# Patient Record
Sex: Female | Born: 1975 | Race: White | Hispanic: No | Marital: Married | Smoking: Former smoker
Health system: Southern US, Community
[De-identification: ages and names within clinical notes are randomized; demographics above are authoritative.]

## PROBLEM LIST (undated history)

## (undated) DIAGNOSIS — I2699 Other pulmonary embolism without acute cor pulmonale: Secondary | ICD-10-CM

## (undated) DIAGNOSIS — E041 Nontoxic single thyroid nodule: Secondary | ICD-10-CM

## (undated) DIAGNOSIS — K219 Gastro-esophageal reflux disease without esophagitis: Secondary | ICD-10-CM

## (undated) DIAGNOSIS — C50919 Malignant neoplasm of unspecified site of unspecified female breast: Secondary | ICD-10-CM

## (undated) DIAGNOSIS — R519 Headache, unspecified: Secondary | ICD-10-CM

## (undated) DIAGNOSIS — Z8619 Personal history of other infectious and parasitic diseases: Secondary | ICD-10-CM

## (undated) DIAGNOSIS — F419 Anxiety disorder, unspecified: Secondary | ICD-10-CM

## (undated) DIAGNOSIS — F329 Major depressive disorder, single episode, unspecified: Secondary | ICD-10-CM

## (undated) DIAGNOSIS — D0510 Intraductal carcinoma in situ of unspecified breast: Secondary | ICD-10-CM

## (undated) DIAGNOSIS — T7840XA Allergy, unspecified, initial encounter: Secondary | ICD-10-CM

## (undated) DIAGNOSIS — F32A Depression, unspecified: Secondary | ICD-10-CM

## (undated) HISTORY — DX: Nontoxic single thyroid nodule: E04.1

## (undated) HISTORY — PX: WRIST ARTHROSCOPY: SHX838

## (undated) HISTORY — DX: Gastro-esophageal reflux disease without esophagitis: K21.9

## (undated) HISTORY — DX: Personal history of other infectious and parasitic diseases: Z86.19

## (undated) HISTORY — PX: BREAST SURGERY: SHX581

## (undated) HISTORY — PX: TONSILLECTOMY: SHX5217

## (undated) HISTORY — DX: Depression, unspecified: F32.A

## (undated) HISTORY — DX: Allergy, unspecified, initial encounter: T78.40XA

## (undated) HISTORY — DX: Major depressive disorder, single episode, unspecified: F32.9

## (undated) HISTORY — PX: ELBOW SURGERY: SHX618

## (undated) HISTORY — PX: TONSILLECTOMY: SUR1361

## (undated) HISTORY — PX: AUGMENTATION MAMMAPLASTY: SUR837

## (undated) HISTORY — DX: Malignant neoplasm of unspecified site of unspecified female breast: C50.919

## (undated) HISTORY — DX: Headache, unspecified: R51.9

---

## 1997-10-17 ENCOUNTER — Ambulatory Visit (HOSPITAL_BASED_OUTPATIENT_CLINIC_OR_DEPARTMENT_OTHER): Admission: RE | Admit: 1997-10-17 | Discharge: 1997-10-17 | Payer: Self-pay | Admitting: *Deleted

## 1999-03-18 ENCOUNTER — Ambulatory Visit (HOSPITAL_BASED_OUTPATIENT_CLINIC_OR_DEPARTMENT_OTHER): Admission: RE | Admit: 1999-03-18 | Discharge: 1999-03-18 | Payer: Self-pay | Admitting: Orthopedic Surgery

## 2000-02-06 ENCOUNTER — Other Ambulatory Visit: Admission: RE | Admit: 2000-02-06 | Discharge: 2000-02-06 | Payer: Self-pay | Admitting: Obstetrics and Gynecology

## 2000-04-14 ENCOUNTER — Other Ambulatory Visit: Admission: RE | Admit: 2000-04-14 | Discharge: 2000-04-14 | Payer: Self-pay | Admitting: Obstetrics and Gynecology

## 2000-12-16 ENCOUNTER — Other Ambulatory Visit: Admission: RE | Admit: 2000-12-16 | Discharge: 2000-12-16 | Payer: Self-pay | Admitting: Obstetrics and Gynecology

## 2001-07-01 ENCOUNTER — Inpatient Hospital Stay (HOSPITAL_COMMUNITY): Admission: AD | Admit: 2001-07-01 | Discharge: 2001-07-03 | Payer: Self-pay | Admitting: Obstetrics and Gynecology

## 2001-08-03 ENCOUNTER — Other Ambulatory Visit: Admission: RE | Admit: 2001-08-03 | Discharge: 2001-08-03 | Payer: Self-pay | Admitting: Obstetrics and Gynecology

## 2002-08-04 ENCOUNTER — Other Ambulatory Visit: Admission: RE | Admit: 2002-08-04 | Discharge: 2002-08-04 | Payer: Self-pay | Admitting: Obstetrics and Gynecology

## 2003-01-23 ENCOUNTER — Ambulatory Visit (HOSPITAL_COMMUNITY): Admission: RE | Admit: 2003-01-23 | Discharge: 2003-01-23 | Payer: Self-pay | Admitting: Obstetrics and Gynecology

## 2003-05-11 ENCOUNTER — Encounter: Admission: RE | Admit: 2003-05-11 | Discharge: 2003-05-11 | Payer: Self-pay | Admitting: Obstetrics and Gynecology

## 2003-08-29 ENCOUNTER — Inpatient Hospital Stay (HOSPITAL_COMMUNITY): Admission: AD | Admit: 2003-08-29 | Discharge: 2003-08-31 | Payer: Self-pay | Admitting: Obstetrics and Gynecology

## 2003-09-24 ENCOUNTER — Other Ambulatory Visit: Admission: RE | Admit: 2003-09-24 | Discharge: 2003-09-24 | Payer: Self-pay | Admitting: Obstetrics and Gynecology

## 2004-10-10 ENCOUNTER — Other Ambulatory Visit: Admission: RE | Admit: 2004-10-10 | Discharge: 2004-10-10 | Payer: Self-pay | Admitting: Obstetrics and Gynecology

## 2009-03-07 ENCOUNTER — Ambulatory Visit (HOSPITAL_COMMUNITY): Admission: RE | Admit: 2009-03-07 | Discharge: 2009-03-07 | Payer: Self-pay | Admitting: Obstetrics and Gynecology

## 2009-03-30 DEATH — deceased

## 2010-04-03 ENCOUNTER — Ambulatory Visit
Admission: RE | Admit: 2010-04-03 | Discharge: 2010-04-03 | Payer: Self-pay | Source: Home / Self Care | Attending: Sports Medicine | Admitting: Sports Medicine

## 2010-04-03 DIAGNOSIS — M629 Disorder of muscle, unspecified: Secondary | ICD-10-CM | POA: Insufficient documentation

## 2010-04-21 ENCOUNTER — Telehealth (INDEPENDENT_AMBULATORY_CARE_PROVIDER_SITE_OTHER): Payer: Self-pay | Admitting: *Deleted

## 2010-05-01 NOTE — Progress Notes (Signed)
  Phone Note Call from Patient   Caller: Patient Summary of Call: Spoke with pt- she states she has taken 2 weeks off of running and worked on hip strengthening exercises.  Went to gym yesterday, tried treadmill- walked first then ran 1/2 and rt lateral knee pain started, was wearing body helix.  Dr. Darrick Penna talked with pt- advised her to try walking 1 min then running 1 min- doing this for intervals.  When no longer painful can increase to 2 min intervals.  Cont hip strengthening exercises return for f/u in 3-4 weeks as it could take a total of 6 weeks for this problem to improve.  Initial call taken by: Rochele Pages RN,  April 21, 2010 12:11 PM

## 2010-05-01 NOTE — Assessment & Plan Note (Signed)
Summary: NP KNEE/ITB BAND - RACE IN MARCH/POST STEROID SHOT   Vital Signs:  Patient profile:   35 year old female Height:      64.5 inches Weight:      122 pounds BMI:     20.69 Pulse rate:   66 / minute BP sitting:   108 / 73  (left arm)  Vitals Entered By: Rochele Pages RN (April 03, 2010 11:02 AM) CC: Rt lateral knee pain intermittent x1 yr   CC:  Rt lateral knee pain intermittent x1 yr.  History of Present Illness: 35yo female to office with c/o R lateral knee pain x 1-yr. Pain has been intermittant, minimal pain from June-Nov, but increasing pain over past several weeks. 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 Lajoyce Corners who thought she is getting ITBS on Rt due to hip weakness.  Steroid injection 12/7 by Dr. Lajoyce Corners which helped for about 2-weeks, but pain back. Previously running 20-miles/wk, now down to 2-miles/wk b/c of pain. Pain usually occurs about 3/4 thru her runs, on long runs usually occurs about mile 7. In addition to CSI, has tried ART, accupuncture, deep tissue massage without improvement.   Icing occasionally without relief. Using Ibuprofen which is helpful.  Tried Pennsaid without help.  X-rays done by Dr. Lajoyce Corners 03/05/10 - 2-views of R knee show no acute abnormalities, no DJD. She also has hx of sciatica on the right, but pain with that stops at her buttock.  Denies any lower ext. numbness/tingling.  No change in bowel or bladder.    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  Physical Exam  General:  Well-developed,well-nourished,in no acute distress; alert,appropriate and cooperative throughout examination Head:  [place]/AT Eyes:  PERRLA, EOMI Mouth:  MM moist/pink Lungs:  normal respiratory effort.   Msk:  HIPS: FROM b/l  without pain.  Neg FABER & FADIR.  Tight ITB on Rt with Ober test, neg Ober on left.  Hip strength - normal flexion & extension b/l, weak abductors & adductors b/l.  KNEES: FROM b/l without pain.  No TTP over joint lines, PT or quad tendon.  Mild TTP along insertion of ITB at [facility] tubercles & mild TTP along distal aspect of ITB.  No ligamentous laxity & Neg McMurray b/l.  ANKLES: FROM without pain, swelling, tenderness, weakness, or laxity.  FEET: b/l Morton's foot, small morton's callous b/l.  Moderate long arch, breakdown of transverse arch with splaying of 1st & 2nd toes b/l.  No tenderness over forefoot, midfoot, or hindfoot.  GAIT: no leg length difference.  Good running form with neutral strike pattern, has significant knee wobble b/l.  No excessive pronation or supination.  BACK: FROM without pain.  No midline or paraspinal tenderness.  Good mobility of SIJ b/l.  Neg SLR.  Able to toe walk & heel walk.   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 - Educated on HEP to focus on hip strength - Educated on ITB stretches & lower extremity stretches to improve flexibility -  Fitted with sports insoles which were comfortable in office today - Fitted with Body Helix knee sleeve to wear with activity - Recommended cross-training with bike & elliptical - f/u 51-month, call with any questions/concerns  Orders: Garment,belt,sleeve or other covering ,elastic or similar stretch (Z6109) Sports Insoles (U0454)   Orders Added: 1)  Garment,belt,sleeve or other covering ,elastic or similar stretch [A4466] 2)  New Patient Level II [99202] 3)  Sports Insoles [L3510]

## 2010-05-05 ENCOUNTER — Ambulatory Visit: Payer: Self-pay | Admitting: Sports Medicine

## 2010-05-19 ENCOUNTER — Ambulatory Visit (INDEPENDENT_AMBULATORY_CARE_PROVIDER_SITE_OTHER): Payer: 59 | Admitting: Sports Medicine

## 2010-05-19 ENCOUNTER — Encounter: Payer: Self-pay | Admitting: Sports Medicine

## 2010-05-19 DIAGNOSIS — M629 Disorder of muscle, unspecified: Secondary | ICD-10-CM

## 2010-05-19 DIAGNOSIS — S335XXA Sprain of ligaments of lumbar spine, initial encounter: Secondary | ICD-10-CM

## 2010-05-19 DIAGNOSIS — M66259 Spontaneous rupture of extensor tendons, unspecified thigh: Secondary | ICD-10-CM

## 2010-05-27 NOTE — Assessment & Plan Note (Signed)
Summary: FU ITB/MC/MJD   Vital Signs:  Patient profile:   35 year old female BP sitting:   109 / 76  Vitals Entered By: Rochele Pages RN (May 19, 2010 11:55 AM)  History of Present Illness: right leg pain: dx with ITB pain and seen for this problem at last visit- here for f/up  doing home exercises- now doing exercises with ankle weights. 1 x week walk/run. or crosstraining on eliptical and bike-- no pain with these.   frustrated that even now after resting for 6 weeks still having symptoms with easy walk/run.   Pain starts in right lateral knee area- this area tender to touch after walk/runs.  pain worse with running or walking hills.    Low back pain:  x 3 days. Woke up saturday am with low back pain, now tightness/pain continues in low back over weekend.    Worse with bending forward as well as leaning back.   Present throughout lower back and radiates to sides of hips.  The night prior to waking up in pain- had picked up 51 year old child. And felt a "twinge" in her back at that time.      Physical Exam  General:  Well-developed,well-nourished,in no acute distress; alert,appropriate and cooperative throughout examination Additional Exam:  MSK [place] RT knee is visualized to have a partial tear of vastus lateralis tendon increased fluid small loose body noted Lt by comparison has normal level of fluid med and lat meniscus OK ITB looks fine with no fluid   Knee Exam  General:    Well-developed, well-nourished, normal body habitus; no deformities, normal grooming.  Gait:    Normal heel-toe gait pattern bilaterally.    Skin:    Intact, no scars,  rashes,or bruising.    Inspection:     No deformity, ecchymosis or swelling.   Palpation:    + mild tenderness over lateral upper knee area on right.  Vascular:    There was no swelling or varicose veins. The pulses and temperature are normal. There was no edema or tenderness.  Sensory:    Gross coordination  and sensation were normal.    Knee Exam:    Right:    Inspection:  Normal    Palpation:  Normal    Stability:  stable    Tenderness:  no    Swelling:  no    Erythema:  no    Left:    Inspection:  Normal    Palpation:  Normal    Stability:  stable    Tenderness:  no    Swelling:  no    Erythema:  no    full range of motion on knee exam bilatera.   Detailed Back/Spine Exam  Lumbosacral Exam:  Inspection-deformity:    Normal Palpation-spinal tenderness:  Normal Squatting:  normal Lying Straight Leg Raise:    Right:  negative    Left:  negative Sitting Straight Leg Raise:    Right:  negative    Left:  negative Fabere Test:    Right:  negative    Left:  negative     Normal range of motion- some tenderness in lower back area with leaning forward and extension of torso.    Impression & Recommendations:  Problem # 1:  RUPTURE, QUADRICEPS TENDON (ICD-727.65)  partial tear of right vastus lateralus identified on U/S.  Pt to place nitroglycerin as directed on affected area.  pt is to continue bike and eliptical as long as not causing any  pain.  Pt given quad exercises (see pt instructions).  She is to return in 4 weeks for follow/up.   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  Pt to apply heat.  Gave stretching /ROM exercises to relieve pain.   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 Apply 1/4 patch to affected area  Patient Instructions: 1)  apply nitroglycerin patch (1/4 patch) to quad tendon area as we discussed today.- 1 patch daily 2)  continue with bike. 3)  quad exercises: 4)  warm up quads with contraction exercises 5)  straight leg raise 3 sets of 15 6)  seated quad extention with light ankle weight  3 sets of 15 7)  squats to 45 degrees with heels on book, toes on floor 8)  Follow exercises as outlined on handout. 9)  continue to use body helix. 10)  return in 4 weeks for recheck Prescriptions: NITROGLYCERIN 0.2 MG/HR PT24 (NITROGLYCERIN) apply 1/4 patch to affected area  #30 x 1   Entered by:   Ellin Mayhew MD   Authorized by:   Enid Baas MD   Signed by:   Ellin Mayhew MD on 05/19/2010   Method used:   Electronically to        [facility] [address] [phone]* (retail)       [address]       [place], [place]  [address]       Ph: [phone] or [phone]       Fax: [phone]   RxID:   [id number]    Orders Added: 1)  Est. Patient Level IV [34742] 2)  [place] LIMITED [[id number]]

## 2010-06-12 ENCOUNTER — Encounter: Payer: Self-pay | Admitting: *Deleted

## 2010-06-16 ENCOUNTER — Ambulatory Visit (INDEPENDENT_AMBULATORY_CARE_PROVIDER_SITE_OTHER): Payer: 59 | Admitting: Sports Medicine

## 2010-06-16 ENCOUNTER — Encounter: Payer: Self-pay | Admitting: Sports Medicine

## 2010-06-16 DIAGNOSIS — M66259 Spontaneous rupture of extensor tendons, unspecified thigh: Secondary | ICD-10-CM

## 2010-06-26 NOTE — Assessment & Plan Note (Signed)
Summary: FOLLOWUP   Vital Signs:  Patient profile:   35 year old female BP sitting:   108 / 80  (right arm)  Vitals Entered By: Rochele Pages RN (June 16, 2010 11:59 AM) CC: f/u rt quad pain   CC:  f/u rt quad pain.  History of Present Illness: Pt here for follow up of rt quad pain.  States she is using elliptical and swimming with no pain.  Has done no running since last appt.  HEP 4-5 times per week.  NTG daily- but has only been wearing for 10 hours, was not aware that she should wear for 24 hours.  NTG causes mild headaches in the morning. Denies swelling in knee, or knee pain, feels her IT band is tight most of the time, but this has unchanged  Preventive Screening-Counseling & Management  Alcohol-Tobacco     Smoking Status: never  Current Medications (verified): 1)  Nitroglycerin 0.2 Mg/hr Pt24 (Nitroglycerin) .... Apply 1/4 Patch To Affected Area  Allergies (verified): No Known Drug Allergies  Physical Exam  General:  Vital signs noted NAD, well developed, well noursished, atheletic appearing Msk:  Right LE-  Inspection- no brusing or swelling noted over knee or lateral aspect of thigh, good definition of quads Palpation- lateral aspect of quads non tender IT non tender Strength- Abductors 5/5, hamstrings 5/5 Knee-normal ROM  Gait- good posture, no pronation noted  Additional Exam:  MSK [place] RT knee is visualized to have a partial tear of vastus lateralis tendon-healing minimal effusion noted small loose body noted IT band normal neo vessels throughout tendon   Impression & Recommendations:  Problem # 1:  RUPTURE, QUADRICEPS TENDON (ICD-727.65) Assessment Improved  Ms. Beans has had a great amount of improvemnent visuazlized by ultrasound. Will use NTG as tolerated secondary to headaches, recheck U/S in 6 weeks Start Walk run program See instructions   Milinda Antis MD, PGY-3  Orders: [place] LIMITED [phone])  Complete Medication List: 1)   Nitroglycerin 0.2 Mg/hr Pt24 (Nitroglycerin) .... Apply 1/4 patch to affected area  Patient Instructions: 1)  Start the walk/run for 20-30 minutes and then build up to short runs ( 3 miles), then add a mile 2)  Continue the nitroglycerin as tolerated at bedtime 3)  Recheck in 6 weeks   Orders Added: 1)  Est. Patient Level III [60454] 2)  [place] LIMITED [[id number]]

## 2010-07-01 LAB — CBC
HCT: 39 % (ref 36.0–46.0)
Hemoglobin: 13.1 g/dL (ref 12.0–15.0)
MCHC: 33.6 g/dL (ref 30.0–36.0)
MCV: 92.6 fL (ref 78.0–100.0)
Platelets: 146 10*3/uL — ABNORMAL LOW (ref 150–400)
RBC: 4.22 MIL/uL (ref 3.87–5.11)
RDW: 13.2 % (ref 11.5–15.5)
WBC: 10 10*3/uL (ref 4.0–10.5)

## 2010-07-01 LAB — ABO/RH: ABO/RH(D): O POS

## 2010-07-30 ENCOUNTER — Other Ambulatory Visit: Payer: Self-pay | Admitting: Obstetrics and Gynecology

## 2010-08-14 ENCOUNTER — Ambulatory Visit (INDEPENDENT_AMBULATORY_CARE_PROVIDER_SITE_OTHER): Payer: 59 | Admitting: Family Medicine

## 2010-08-14 ENCOUNTER — Encounter: Payer: Self-pay | Admitting: Family Medicine

## 2010-08-14 VITALS — BP 114/75 | HR 50 | Temp 98.2°F | Ht 65.0 in | Wt 127.0 lb

## 2010-08-14 DIAGNOSIS — M629 Disorder of muscle, unspecified: Secondary | ICD-10-CM

## 2010-08-14 DIAGNOSIS — M2420 Disorder of ligament, unspecified site: Secondary | ICD-10-CM

## 2010-08-14 NOTE — Patient Instructions (Signed)
You have IT band syndrome Avoid painful activities as much as possible. Ice over area of pain 3-4 times a day for 15 minutes at a time Hip abduction exercise 3 x 15 once a day - add weights if this becomes too easy. Continue with IT band stretches - when doing these, hold for 20-30 seconds x 3 - do once or twice a day. Tylenol and/or aleve as needed for pain. You were given a repeat cortisone injection today - I would not repeat another one. Avoid hilly terrain as much as possible. Cross train over the next week then ok to get back to walk/jog program. Ok to try Active Release Therapy again. PRP is a consideration but is unproven - you would have to call orthopedic groups in the area to ask who uses this and what the out of pocket cost is. Surgery is an option as well but is typically the last line of treatment as a vast majority get better without this.

## 2010-08-18 ENCOUNTER — Encounter: Payer: Self-pay | Admitting: Family Medicine

## 2010-08-18 NOTE — Progress Notes (Signed)
Subjective:    Patient ID: Kristina King, female    DOB: 12-Feb-1976, 35 y.o.   MRN: [id number]  HPI  35 yo F here for right IT band issues.  Patient's last OV was about 6 weeks ago for the same. Was found by ultrasound to have a vastus lateralis partial tear - used nitroglycerin, did exercises and this improved both clinically and by ultrasound.  States she used the nitro for 6-8 weeks though did cause mild headache. She continues to have right lateral knee pain and pain along IT band. Pain started more than a year ago. Has tried cortisone injection (by Dr. Lajoyce Corners - was about 5 inches above knee joint) but no help with this. Done ART which helped some. Deep tissue massage also tried. Still doing standing hip rotations and abduction exercise - feels stronger but pain persists. No pain currently while sitting or walking. Tried to do walk/jog program but Monday when not even up to 2 miles had severe pain lateral knee and had to stop. Has been able to cross train with elliptical and bike. No swelling or bruising.  Past Medical History  Diagnosis Date  . Depression     Current Outpatient Prescriptions on File Prior to Visit  Medication Sig Dispense Refill  . DISCONTD: nitroGLYCERIN (NITRODUR) 0.2 mg/hr apply 1/4 patch to affected area         No past surgical history on file.  No Known Allergies  History   Social History  . Marital Status: Married    Spouse Name: N/A    Number of Children: N/A  . Years of Education: N/A   Occupational History  . Not on file.   Social History Main Topics  . Smoking status: Never Smoker   . Smokeless tobacco: Not on file  . Alcohol Use: Not on file  . Drug Use: Not on file  . Sexually Active: Not on file   Other Topics Concern  . Not on file   Social History Narrative  . No narrative on file    Family History  Problem Relation Age of Onset  . Hypertension Mother   . Hypertension Father   . Heart attack Neg Hx   . Diabetes  Neg Hx     BP 114/75  Pulse 50  Temp(Src) 98.2 F (36.8 C) (Oral)  Ht 5\' 5"  (1.651 m)  Wt 127 lb (57.607 kg)  BMI 21.13 kg/m2  Review of Systems See HPI above.    Objective:   Physical Exam Gen: NAD R knee: No gross deformity, swelling, bruising. TTP throughout IT band but greatest just above knee joint about level of lateral femoral condyle.  No medial or lateral joint line TTP.  No TTP of quad with good definition. FROM. Strength 5/5 with hip abduction. Ligaments intact Negative Mcmurrays. No leg length inequality. Able to jog in hallway - inturns both patellas mildly but otherwise form normal.    MSK u/s: IT band appears intact without increased neovascularity, scar tissue, calcifications.  Vastus lateralis appears healed without neovascularity.  Calcification visualized.  No pain on ultrasound of this area or palpation of this area.  Small amount of edema but compared to left knee is equal.  Assessment & Plan:  1. R ITBS - History and exam consistent with refractory ITBS.  Glut medius strength now 5/5 and compliant with exercises and stretches.  Using comforthotics.  No leg length inequality.  Unsuccessful with walk/jog program.  Finished a course of nitro patches x ~6-8  weeks - caused mild headache but was able to continue full course.  Do not see area that would be responsive to further course.  Discussed options: additional PT with ionto, cortisone injection, consideration of PRP, surgical referral.  We discussed natural history and that surgery, PRP unproven for this condition and felt to be last resort of treatment.  Has done exercises and stretches regularly and this shows in her exam today (5/5 strength).  Will attempt cortisone shot again (patient noted today's maximum area of tenderness at lateral femoral condyle where shot was given is more distal than prior shot that did not help).  Continue cross training, can work back into walk/jog.  Vastus lateralis tear appears  healed and not showing any signs of this being an issue any longer.  See instructions for further.  After informed written consent, patient was lying on exam table on left side.  Area overlying distal IT band was prepped with alcohol swab.  Distal IT band then injected with 2:1 marcaine: depomedrol with peppering of needle into area of maximum tenderness here.  Patient tolerated procedure well without any immediate complications.

## 2010-08-18 NOTE — Assessment & Plan Note (Signed)
History and exam consistent with refractory ITBS.  Glut medius strength now 5/5 and compliant with exercises and stretches.  Using comforthotics.  No leg length inequality.  Unsuccessful with walk/jog program.  Finished a course of nitro patches x ~6-8 weeks - caused mild headache but was able to continue full course.  Do not see area that would be responsive to further course.  Discussed options: additional PT with ionto, cortisone injection, consideration of PRP, surgical referral.  We discussed natural history and that surgery, PRP unproven for this condition and felt to be last resort of treatment.  Has done exercises and stretches regularly and this shows in her exam today (5/5 strength).  Will attempt cortisone shot again (patient noted today's maximum area of tenderness at lateral femoral condyle where shot was given is more distal than prior shot that did not help).  Continue cross training, can work back into walk/jog.  Vastus lateralis tear appears healed and not showing any signs of this being an issue any longer.  See instructions for further.  After informed written consent, patient was lying on exam table on left side.  Area overlying distal IT band was prepped with alcohol swab.  Distal IT band then injected with 2:1 marcaine: depomedrol with peppering of needle into area of maximum tenderness here.  Patient tolerated procedure well without any immediate complications.

## 2011-12-29 ENCOUNTER — Telehealth: Payer: Self-pay | Admitting: *Deleted

## 2011-12-29 NOTE — Telephone Encounter (Signed)
Pt states she injured her knee working out Friday.  Knee is popping, catching, and feels like it could give out.  Scheduled her for an appt tomorrow with Dr. Margaretha Sheffield.

## 2011-12-29 NOTE — Telephone Encounter (Signed)
Message copied by Mora Bellman on Tue Dec 29, 2011  5:17 PM ------      Message from: Lizbeth Bark      Created: Tue Dec 29, 2011  3:50 PM      Regarding: phone message      Contact: [phone]       Pt wanted to talk to Dr. Darrick Penna or a nurse regarding her it band pain. Didn't want to schedule appt unless it was needed.

## 2011-12-29 NOTE — Telephone Encounter (Signed)
Left pt a VM to return my call  

## 2011-12-30 ENCOUNTER — Ambulatory Visit (INDEPENDENT_AMBULATORY_CARE_PROVIDER_SITE_OTHER): Payer: 59 | Admitting: Sports Medicine

## 2011-12-30 ENCOUNTER — Encounter: Payer: Self-pay | Admitting: Sports Medicine

## 2011-12-30 VITALS — BP 124/83 | HR 61 | Ht 65.0 in | Wt 127.0 lb

## 2011-12-30 DIAGNOSIS — M25569 Pain in unspecified knee: Secondary | ICD-10-CM

## 2011-12-30 DIAGNOSIS — M25561 Pain in right knee: Secondary | ICD-10-CM

## 2011-12-30 NOTE — Patient Instructions (Addendum)
You have been scheduled for an appointment for your MRI on Friday - 01/01/12 arrive at 3pm for a 3:15pm appointment.

## 2011-12-30 NOTE — Progress Notes (Signed)
  Subjective:    Patient ID: Kristina King, female    DOB: 08/08/1975, 36 y.o.   MRN: [id number]  HPI chief complaint: Right knee pain  Patient is a very pleasant, very active 36 year old female that comes in today complaining of sudden onset right knee pain and swelling. Symptoms began 4 days ago after a workout. No injury at the time of the workout, but symptoms started immediately thereafter and have gotten worse over the past 4 days. She has a history of IT band syndrome and a quad tendon strain in this knee but her current pain is different in nature than which is expressed previously. She's had 2 cortisone injections into the IT band in the past. She's also been working on hip and knee strengthening. She's used over-the-counter anti-inflammatories, but despite this her symptoms persist. She gets painful catching and popping with the knee with standing and twisting. Knee feels like it wants to give way. No prior knee surgeries.   Review of Systems as above     Objective:   Physical Exam Well-developed, well-nourished. No acute distress. Awake alert and oriented x3  Right knee: Range of motion 0-100. 1+ patellofemoral crepitus with tethering of the patella laterally. 1+ joint effusion. She is tender to palpation along the medial joint line with a positive McMurrays. Positive Thessalys. Knee is stable to valgus and varus stressing. Negative anterior drawer, negative posterior drawer. No tenderness over the presence anserine bursa. Neurovascular intact distally. Walking with an obviously limp.  MSK ultrasound right knee: Patient has a joint effusion. Quad tendon is intact and within normal limits. Visualized portion of the medial meniscus appears to be unremarkable. Lateral meniscus was difficult to visualize. No edema in the pes anserine bursa        Assessment & Plan:  1. Right knee pain secondary to medial meniscal tear versus chondromalacia patella  Although the patient's current  knee pain and swelling are different in nature than what she has experienced previously, I question whether or not her chronic IT band syndrome is not something else. She's had 2 cortisone injections into her IT band and has had aggressive therapy in the form of a home exercise program. Oral anti-inflammatories are ineffective in her current exam has me concerned for possible meniscal tear. We will get an MRI scan of this right knee specifically to rule out a medial meniscal tear as well as to evaluate degree of chondromalacia which may be present. I've given her a body helix compression sleeve to wear and I've asked her to avoid impact exercise until after I reviewed the MRI. I will call her at [phone] with those findings once available at which point we will delineate further treatment.

## 2012-01-05 ENCOUNTER — Ambulatory Visit (INDEPENDENT_AMBULATORY_CARE_PROVIDER_SITE_OTHER): Payer: 59 | Admitting: Sports Medicine

## 2012-01-05 VITALS — BP 120/70 | Ht 65.0 in | Wt 127.0 lb

## 2012-01-05 DIAGNOSIS — M25569 Pain in unspecified knee: Secondary | ICD-10-CM

## 2012-01-05 DIAGNOSIS — M25469 Effusion, unspecified knee: Secondary | ICD-10-CM

## 2012-01-05 NOTE — Progress Notes (Signed)
  Subjective:    Patient ID: Kristina King, female    DOB: Nov 17, 1975, 36 y.o.   MRN: [id number]  HPI Patient comes in today to go over MRI findings of her right knee. Only abnormality was a small 4 mm x 6 mm area of partial thickness cartilage loss at the anterior weightbearing femoral condyle. No meniscal tear. She also has a thickened suprapatellar plica with a moderate joint effusion. Her pain continues to be diffuse around the knee. Swelling has improved with the body heel is compression sleeve. She's getting some catching and popping as well as some feelings of instability.    Review of Systems     Objective:   Physical Exam Well-developed, well-nourished. No acute distress  Right knee: Range of motion 0-130. Trace effusion. She does have a palpable suprapatellar plica but it is nonpainful. She still has slight tenderness along the medial joint line and over the medial femoral condyle. Good ligamentous stability. Neurovascularly intact distally. Walks with a slight limp.       Assessment & Plan:  1. Right knee pain and swelling with MRI evidence of a small focal area of partial cartilage loss in suprapatellar plica  I recommended a cortisone injection for the right knee. Right knee was injected using an anterior medial approach. I've instructed the patient on isometric quad strength she can begin to increase her activity as tolerated. I do want her to avoid squats and lunges and to use her compression sleeve with activity as well as one-hour post exercise. If symptoms persist, I would consider referral to orthopedics for diagnostic arthroscopy and possible plica excision. Followup when necessary.

## 2012-01-07 DIAGNOSIS — Z23 Encounter for immunization: Secondary | ICD-10-CM

## 2013-01-06 ENCOUNTER — Ambulatory Visit (INDEPENDENT_AMBULATORY_CARE_PROVIDER_SITE_OTHER): Payer: 59

## 2013-01-06 DIAGNOSIS — Z23 Encounter for immunization: Secondary | ICD-10-CM

## 2013-03-31 ENCOUNTER — Other Ambulatory Visit (HOSPITAL_COMMUNITY): Payer: Self-pay | Admitting: Obstetrics and Gynecology

## 2013-03-31 ENCOUNTER — Ambulatory Visit (HOSPITAL_COMMUNITY)
Admission: RE | Admit: 2013-03-31 | Discharge: 2013-03-31 | Disposition: A | Discharge status: Home / Self Care | Payer: 59 | Source: Ambulatory Visit | Attending: Obstetrics and Gynecology | Admitting: Obstetrics and Gynecology

## 2013-03-31 DIAGNOSIS — E041 Nontoxic single thyroid nodule: Secondary | ICD-10-CM

## 2013-03-31 DIAGNOSIS — E042 Nontoxic multinodular goiter: Secondary | ICD-10-CM | POA: Insufficient documentation

## 2013-04-03 ENCOUNTER — Ambulatory Visit (HOSPITAL_COMMUNITY): Payer: Self-pay

## 2013-04-04 ENCOUNTER — Other Ambulatory Visit: Payer: Self-pay | Admitting: Obstetrics and Gynecology

## 2013-04-04 DIAGNOSIS — E041 Nontoxic single thyroid nodule: Secondary | ICD-10-CM

## 2013-04-06 ENCOUNTER — Ambulatory Visit
Admission: RE | Admit: 2013-04-06 | Discharge: 2013-04-06 | Disposition: A | Discharge status: Home / Self Care | Payer: BC Managed Care – PPO | Source: Ambulatory Visit | Attending: Obstetrics and Gynecology | Admitting: Obstetrics and Gynecology

## 2013-04-06 ENCOUNTER — Other Ambulatory Visit (HOSPITAL_COMMUNITY)
Admission: RE | Admit: 2013-04-06 | Discharge: 2013-04-06 | Disposition: A | Discharge status: Home / Self Care | Payer: BC Managed Care – PPO | Source: Ambulatory Visit | Attending: Interventional Radiology | Admitting: Interventional Radiology

## 2013-04-06 DIAGNOSIS — E041 Nontoxic single thyroid nodule: Secondary | ICD-10-CM | POA: Insufficient documentation

## 2013-04-06 MED ORDER — DEXAMETHASONE 6 MG PO TABS: 6.0000 mg | ORAL_TABLET | Freq: Once | ORAL | Status: DC

## 2013-04-06 NOTE — Progress Notes (Signed)
1500  Decadron 6 mg tab po (per order Dr Miles CostainShick).  Kristina Cutbirth Carmell AustriaGales, RN 04/06/2013 3:18 PM

## 2013-04-06 NOTE — Progress Notes (Signed)
Patient ID: Kristina BuffaloJennifer L King, female   DOB: 26-Jul-1975, 38 y.o.   MRN: [id number]  [facility] thyroid cystic nodule bx at 9:20am  On exam, there is diffuse thyroid neck swelling acutely post bx (approx 4 hrs) Thyroid gland is swollen, tender, and enlarged.  No overlying bruising.  No resp distress or airway compromise Lungs CTA  No wheezing CV RRR Airway clear  bp 125/81 p 64 RR14  T97.8  98% on RA  US shows diffuse heterogenous thyroid swellling without surrounding soft tissue hematoma  Assessment:  Diffuse thyroid hemorrhage vs acute transient swelling of the thyroid gland post bx  Plan:  Decadron 6mg  po Elevation Ice pack to neck Close observation

## 2013-04-11 ENCOUNTER — Other Ambulatory Visit: Payer: 59

## 2013-04-14 ENCOUNTER — Ambulatory Visit (INDEPENDENT_AMBULATORY_CARE_PROVIDER_SITE_OTHER): Payer: 59 | Admitting: Surgery

## 2013-04-14 ENCOUNTER — Encounter (INDEPENDENT_AMBULATORY_CARE_PROVIDER_SITE_OTHER): Payer: Self-pay | Admitting: Surgery

## 2013-04-14 ENCOUNTER — Encounter (INDEPENDENT_AMBULATORY_CARE_PROVIDER_SITE_OTHER): Payer: Self-pay

## 2013-04-14 VITALS — BP 118/78 | HR 72 | Temp 98.2°F | Resp 14 | Ht 64.5 in | Wt 131.0 lb

## 2013-04-14 DIAGNOSIS — E042 Nontoxic multinodular goiter: Secondary | ICD-10-CM | POA: Insufficient documentation

## 2013-04-14 NOTE — Progress Notes (Signed)
General Surgery [facility]  Chief Complaint  Patient presents with  . New Evaluation    multiple thyroid nodules - referral from Dr. Harold HedgeJames Tomblin    HISTORY: Patient is a 38 year old female referred by her gynecologist with newly diagnosed thyroid nodules. Patient had noted a "feeling that something was stuck in my throat" in early December 2014. On self-examination, the patient noted a mass in the left neck. She presented for physical examination and a mass in the lower left neck was appreciated by her gynecologist. Patient subsequently underwent a thyroid ultrasound. This showed a small multinodular goiter with the right lobe measuring 5.3 cm and left lobe measuring 5.8 cm. The largest nodule on the right measured 1.0 cm. The largest nodule on the left measured 2.9 cm and was largely cystic. Subsequently an ultrasound-guided fine-needle aspiration biopsy was performed on the dominant nodule on the left. Cytopathology shows the contents of a cyst consistent with nonneoplastic goiter. Following the procedure, the patient had significant swelling in the neck and discomfort. This is now largely resolved.  Laboratory studies performed December 2014 showed normal thyroid function test with a T4 level of 9.6 and a TSH level of 1.895.  Patient has no prior history of head or neck surgery. She has never been on thyroid medication. There is a family history of thyroid surgery in a paternal grandmother but her diagnosis is uncertain. There is no family history of endocrine neoplasm.  Past Medical History  Diagnosis Date  . Depression     Current Outpatient Prescriptions  Medication Sig Dispense Refill  . diclofenac (VOLTAREN) 50 MG EC tablet        No current facility-administered medications for this visit.    No Known Allergies  Family History  Problem Relation Age of Onset  . Hypertension Mother   . Cancer Mother     breast  . Hypertension Father   . Heart attack  Neg Hx   . Diabetes Neg Hx   . Cancer Maternal Grandmother     breast  . Cancer Paternal Grandmother     lung    History   Social History  . Marital Status: Married    Spouse Name: N/A    Number of Children: N/A  . Years of Education: N/A   Social History Main Topics  . Smoking status: Former Smoker    Quit date: 04/14/1993  . Smokeless tobacco: Never Used  . Alcohol Use: 0.0 oz/week    1-2 drink(s) per week     Comment: weekly  . Drug Use: No  . Sexual Activity: None   Other Topics Concern  . None   Social History Narrative  . None    REVIEW OF SYSTEMS - PERTINENT POSITIVES ONLY: Denies tremor. Denies palpitations. He denies significant compressive symptoms.  EXAM: Filed Vitals:   04/14/13 0954  BP: 118/78  Pulse: 72  Temp: 98.2 F (36.8 C)  Resp: 14    GENERAL: well-developed, well-nourished, no acute distress HEENT: normocephalic; pupils equal and reactive; sclerae clear; dentition good; mucous membranes moist NECK:  Palpable firm smooth discrete nodule left thyroid lobe approximately 3 cm in size, mobile with swallowing, nontender; right thyroid lobe without palpable abnormality; asymmetric on extension; no palpable anterior or posterior cervical lymphadenopathy; no supraclavicular masses; no tenderness CHEST: clear to auscultation bilaterally without rales, rhonchi, or wheezes CARDIAC: regular rate and rhythm without significant murmur; peripheral pulses are full EXT:  non-tender without edema; no deformity NEURO: no gross focal  deficits; no sign of tremor   LABORATORY RESULTS: See [facility] ([facility]) for most recent results  RADIOLOGY RESULTS: See [facility] ([facility]Epic) for most recent results  IMPRESSION: #1 multinodular thyroid goiter, small #2 dominant left thyroid nodule, 2.9 cm, cystic  PLAN: The patient and I had a lengthy discussion regarding her symptoms and the findings on her ultrasound and fine-needle aspiration biopsy. She  has read extensively on the subject and had many questions. We discussed options for further management. At this point I believe there are 3 options. First, we could continue close observation with a followup ultrasound and TSH level in 6 months. Secondly we could start her on low-dose thyroid hormone and suppress her TSH to approximately 1.0. Third, we could proceed with thyroidectomy.  At this point the patient has no absolute indication for thyroidectomy. Likewise, her thyroid function is normal and I do not think she needs thyroid hormone supplementation. I have recommended close followup with a repeat ultrasound and TSH level in 6 months. I will see her back in the office at that time for physical examination and to discuss her results.  Patient is comfortable with this approach. We will see her in 6 months.  Velora Heckler, MD, FACS General & Endocrine Surgery [facility]  Primary Care Physician: Leslie Andrea, MD

## 2013-04-14 NOTE — Patient Instructions (Signed)

## 2013-10-12 ENCOUNTER — Other Ambulatory Visit: Payer: 59

## 2013-10-17 ENCOUNTER — Other Ambulatory Visit (INDEPENDENT_AMBULATORY_CARE_PROVIDER_SITE_OTHER): Payer: Self-pay

## 2013-10-17 ENCOUNTER — Telehealth (INDEPENDENT_AMBULATORY_CARE_PROVIDER_SITE_OTHER): Payer: Self-pay

## 2013-10-17 DIAGNOSIS — E042 Nontoxic multinodular goiter: Secondary | ICD-10-CM

## 2013-10-17 NOTE — Telephone Encounter (Signed)
LMOM for pt to return call. Pt needs to have TSH done at [facility] if she has not had them drawn at her pcp. Lab order in epic and released. Pt should go to lab at least one day prior to her appt with Dr Gerrit FriendsGerkin.

## 2013-10-19 ENCOUNTER — Ambulatory Visit
Admission: RE | Admit: 2013-10-19 | Discharge: 2013-10-19 | Disposition: A | Discharge status: Home / Self Care | Payer: BC Managed Care – PPO | Source: Ambulatory Visit | Attending: Surgery | Admitting: Surgery

## 2013-10-19 DIAGNOSIS — E042 Nontoxic multinodular goiter: Secondary | ICD-10-CM

## 2013-10-24 ENCOUNTER — Encounter (INDEPENDENT_AMBULATORY_CARE_PROVIDER_SITE_OTHER): Payer: Self-pay | Admitting: Surgery

## 2013-10-24 ENCOUNTER — Ambulatory Visit (INDEPENDENT_AMBULATORY_CARE_PROVIDER_SITE_OTHER): Payer: BC Managed Care – PPO | Admitting: Surgery

## 2013-10-24 VITALS — BP 116/70 | HR 75 | Temp 98.0°F | Ht 64.0 in | Wt 132.0 lb

## 2013-10-24 DIAGNOSIS — E042 Nontoxic multinodular goiter: Secondary | ICD-10-CM

## 2013-10-24 NOTE — Patient Instructions (Signed)
Thyroid-Stimulating Hormone A thyroid-stimulating hormone (TSH) test is a blood test that is done to measure the level of TSH, also known as thyrotropin, in your blood. Knowing the level of TSH in your blood can help your health care provider:  Diagnose a thyroid gland or pituitary gland disorder.  Manage your condition and treatment if you have hypothyroidism or hyperthyroidism. TSH is produced by the pituitary gland. The pituitary gland is a small organ located just below the brain, behind your eyes and nasal passages. It is part of a system that monitors and maintains thyroid hormone levels and thyroid gland function. Thyroid hormones affect many body parts and systems, including the system that affects how quickly your body burns fuel for energy. Your health care provider may recommend testing your TSH level if you have signs and symptoms that are often associated with abnormal thyroid hormone levels. The blood used for testing is obtained through a needle placed in a vein in your arm. RESULTS It is your responsibility to obtain your test results. Ask the lab or department performing the test when and how you will get your results. Contact your health care provider to discuss any questions you have about your results.  Your health care provider will go over the test results with you and discuss the importance and meaning of your results, as well as the need for additional tests if necessary. Range of Normal Values  Ranges for normal values may vary among different labs and hospitals. You should always check with your health care provider after having lab work or other tests done to discuss whether your values are considered within normal limits.  Adult: 0.3-5 microU/mL or 0.3-5 mU/L (SI units).  Newborn: 3-18 microU/mL or 3-18 mU/L.  Cord: 3-12 microU/mL or 3-12 mU/L. Meaning of Results Outside Normal Value Ranges A high level of TSH may mean:  Your thyroid gland is not making enough  thyroid hormones. When the thyroid gland does not make enough thyroid hormones, the pituitary gland releases TSH into the bloodstream. The higher-than-normal levels of TSH prompt the thyroid gland to release more thyroid hormones.  You are getting an insufficient level of thyroid hormone medicine, if you are receiving this type of treatment.  There is a problem with the pituitary gland (rare). A low level of TSH can indicate a problem with the pituitary gland. Document Released: 04/10/2004 Document Revised: 07/31/2013 Document Reviewed: 02/26/2008 ExitCare Patient Information 2015 [employer]. This information is not intended to replace advice given to you by your health care provider. Make sure you discuss any questions you have with your health care provider.  

## 2013-10-24 NOTE — Progress Notes (Signed)
General Surgery [facility]  Chief Complaint  Patient presents with  . Follow-up    thyroid nodules    HISTORY: Patient is a 38 year old female followed for thyroid nodules. She has a history of a thyroid cyst which was aspirated with benign cytopathology. She is not on thyroid medication.  At my request she underwent a thyroid ultrasound on 10/19/2013. This demonstrated a normal sized thyroid gland containing bilateral hypoechoic nodules and cyst. All of these measured less than 11 mm in size and were stable compared to her prior study. The previously aspirated left inferior complex cyst has not recurred.  PERTINENT REVIEW OF SYSTEMS: Denies tremor. Occasional brief palpitation. No compressive symptoms. No pain. No palpable abnormalities.  EXAM: HEENT: normocephalic; pupils equal and reactive; sclerae clear; dentition good; mucous membranes moist NECK:  The gland is slightly firm and diffusely nodular without discrete or dominant mass; symmetric on extension; no palpable anterior or posterior cervical lymphadenopathy; no supraclavicular masses; no tenderness CHEST: clear to auscultation bilaterally without rales, rhonchi, or wheezes CARDIAC: regular rate and rhythm without significant murmur; peripheral pulses are full EXT:  non-tender without edema; no deformity NEURO: no gross focal deficits; no sign of tremor   IMPRESSION: Bilateral thyroid nodules, clinically stable  PLAN: The patient and I reviewed her ultrasound report in detail. At this point there is no indication for surgical intervention.  Patient should have a TSH level drawn annually.  I will ask her gynecologist, Dr. Harold HedgeJames Tomblin, to do this with her annual physical exam.  Patient will return in 2 years. We will repeat a thyroid ultrasound and a physical examination at that time.  Velora Hecklerodd M. Sherry Rogus, MD, [facility] Office: [phone]  Visit Diagnoses: 1. Multiple  thyroid nodules

## 2014-01-25 ENCOUNTER — Encounter (HOSPITAL_COMMUNITY): Payer: Self-pay | Admitting: Emergency Medicine

## 2014-01-25 DIAGNOSIS — Z87891 Personal history of nicotine dependence: Secondary | ICD-10-CM | POA: Diagnosis not present

## 2014-01-25 DIAGNOSIS — W010XXD Fall on same level from slipping, tripping and stumbling without subsequent striking against object, subsequent encounter: Secondary | ICD-10-CM | POA: Diagnosis not present

## 2014-01-25 DIAGNOSIS — G8918 Other acute postprocedural pain: Secondary | ICD-10-CM | POA: Insufficient documentation

## 2014-01-25 DIAGNOSIS — R202 Paresthesia of skin: Secondary | ICD-10-CM | POA: Insufficient documentation

## 2014-01-25 DIAGNOSIS — Z8659 Personal history of other mental and behavioral disorders: Secondary | ICD-10-CM | POA: Insufficient documentation

## 2014-01-25 DIAGNOSIS — R200 Anesthesia of skin: Secondary | ICD-10-CM | POA: Insufficient documentation

## 2014-01-25 DIAGNOSIS — T79A11D Traumatic compartment syndrome of right upper extremity, subsequent encounter: Secondary | ICD-10-CM | POA: Diagnosis not present

## 2014-01-25 MED ORDER — ONDANSETRON HCL 4 MG/2ML IJ SOLN
4.0000 mg | Freq: Once | INTRAMUSCULAR | Status: AC
  Administered 2014-01-25: 4 mg via INTRAVENOUS
  Filled 2014-01-25: qty 2

## 2014-01-25 MED ORDER — FENTANYL CITRATE 0.05 MG/ML IJ SOLN
50.0000 ug | Freq: Once | INTRAMUSCULAR | Status: AC
  Administered 2014-01-25: 50 ug via INTRAVENOUS
  Filled 2014-01-25: qty 2

## 2014-01-25 NOTE — ED Notes (Signed)
Pt. reports pain at right elbow unrelieved by prescription Oxycodone prescribed this morning after an elbow operation .

## 2014-01-26 ENCOUNTER — Emergency Department (HOSPITAL_COMMUNITY)
Admission: EM | Admit: 2014-01-26 | Discharge: 2014-01-26 | Disposition: A | Discharge status: Home / Self Care | Payer: BC Managed Care – PPO | Attending: Emergency Medicine | Admitting: Emergency Medicine

## 2014-01-26 ENCOUNTER — Emergency Department (HOSPITAL_COMMUNITY): Payer: BC Managed Care – PPO

## 2014-01-26 DIAGNOSIS — T79A11A Traumatic compartment syndrome of right upper extremity, initial encounter: Secondary | ICD-10-CM

## 2014-01-26 DIAGNOSIS — G8918 Other acute postprocedural pain: Secondary | ICD-10-CM

## 2014-01-26 MED ORDER — HYDROMORPHONE HCL 1 MG/ML IJ SOLN: 1.0000 mg | INTRAMUSCULAR | Status: DC | PRN

## 2014-01-26 MED ORDER — DIAZEPAM 2 MG PO TABS
2.0000 mg | ORAL_TABLET | Freq: Once | ORAL | Status: AC
  Administered 2014-01-26: 2 mg via ORAL
  Filled 2014-01-26: qty 1

## 2014-01-26 MED ORDER — ONDANSETRON HCL 4 MG/2ML IJ SOLN
4.0000 mg | INTRAMUSCULAR | Status: AC
  Administered 2014-01-26: 4 mg via INTRAVENOUS
  Filled 2014-01-26: qty 2

## 2014-01-26 MED ORDER — HYDROMORPHONE HCL 1 MG/ML IJ SOLN
1.0000 mg | Freq: Once | INTRAMUSCULAR | Status: AC
  Administered 2014-01-26: 1 mg via INTRAVENOUS
  Filled 2014-01-26: qty 1

## 2014-01-26 MED ORDER — FENTANYL CITRATE 0.05 MG/ML IJ SOLN
50.0000 ug | Freq: Once | INTRAMUSCULAR | Status: AC
  Administered 2014-01-26: 50 ug via INTRAVENOUS
  Filled 2014-01-26: qty 2

## 2014-01-26 MED ORDER — DIAZEPAM 2 MG PO TABS: 2.0000 mg | ORAL_TABLET | Freq: Two times a day (BID) | ORAL | Status: DC | PRN

## 2014-01-26 NOTE — ED Notes (Signed)
Pt noted to have increased swelling in right hand. EDP to bedside, removed RUE splint, arm placed back in sling, continuing to monitor patient for pain relief.

## 2014-01-26 NOTE — Discharge Instructions (Signed)
Elbow Fracture with Open Reduction and Internal Fixation (ORIF)  Care After Kristina King, you were seen for pain in your arm. Your splint was on too tight and this was fixed. Follow-up with your orthopedic surgeon within 3 days for continued treatment. Continue to take pain medication and muscle relaxants as needed. If any of your symptoms return or worsen come back to the emergency department immediately for repeat evaluation. Thank you. Please read the instructions outlined below. Refer to these instructions for the next few weeks. These discharge instructions provide you with general information on caring for yourself after surgery. Your caregiver may also give you specific instructions. While your treatment has been planned according to the most current medical practices available, unavoidable complications occasionally occur. If you have any problems or questions after discharge, please call your caregiver. HOME CARE INSTRUCTIONS   It will be normal to be sore for a couple weeks following surgery. See your caregiver if this seems to be getting worse rather than better.  Only take over-the-counter or prescription medicines for pain, discomfort, or fever as directed by your caregiver.  Use showers for bathing, until seen or as instructed.  Change dressings and see your surgeon as directed.  You may resume normal diet and activities as directed or allowed.  Use your elbow as directed.  Make an appointment to see your caregiver for suture (stitches) or staple removal when instructed.  You may use ice for 15-20 minutes, 03-04 times per day for the first day. Never place the ice pack directly on your skin. Use a towel between the ice pack and your skin. Be especially careful using ice on an elbow because icing for too long may damage the nerves which are close to the surface. SEEK MEDICAL CARE IF:   There is redness, swelling, or increasing pain in the wound area.  Pus is coming from the  wound.  An unexplained oral temperature above 102 F (38.9 C) develops.  You notice a foul smell coming from the wound or dressing.  The wound edges break open after sutures or staples have been removed.  You develop increasing pain. SEEK IMMEDIATE MEDICAL CARE IF:   You develop a rash.  You have difficulty breathing  You develop any reaction or side effects to medicine given. Document Released: 10/03/2004 Document Revised: 06/08/2011 Document Reviewed: 11/02/2007 [facility] Patient Information 2015 [place], [place]. This information is not intended to replace advice given to you by your health care provider. Make sure you discuss any questions you have with your health care provider.

## 2014-01-26 NOTE — ED Notes (Signed)
Pt ambulating independently w/ steady gait on d/c in no acute distress, A&Ox4. D/c instructions reviewed w/ pt and family - pt and family deny any further questions or concerns at present. Rx given x1  

## 2014-01-26 NOTE — ED Notes (Signed)
Dr. Mora Bellmanni at bedside for evaluation

## 2014-01-26 NOTE — ED Notes (Addendum)
Dr. Eulah PontMurphy at bedside to reapply splint to extremity.

## 2014-01-26 NOTE — ED Provider Notes (Signed)
CSN: [id number]     Arrival date & time 01/25/14  2141 History   First MD Initiated Contact with Patient 01/26/14 0031     Chief Complaint  Patient presents with  . Post-op Problem     (Consider location/radiation/quality/duration/timing/severity/associated sxs/prior Treatment) HPI Kristina King is a 38 y.o. female with past medical history of depression coming in with right elbow pain. Patient slipped yesterday in the garage and broke her elbow. She was operated on by Dr.Landu yesterday and sent home in a splint. Patient isn't taking oxycodone for pain but the pain has been more severe. She took a total of 20 mg without any relief. She denies any fevers. She has pain with passive movement. She also describes numbness and tingling in the hand. Patient states her hands become more swollen as well. They called the orthopedic surgery line who advised them to come to emergency department for evaluation. Patient has no further complaints.  10 Systems reviewed and are negative for acute change except as noted in the HPI.     Past Medical History  Diagnosis Date  . Depression    Past Surgical History  Procedure Laterality Date  . Tonsillectomy    . Wrist arthroscopy    . Breast surgery      augmentation  . Elbow surgery     Family History  Problem Relation Age of Onset  . Hypertension Mother   . Cancer Mother     breast  . Hypertension Father   . Heart attack Neg Hx   . Diabetes Neg Hx   . Cancer Maternal Grandmother     breast  . Cancer Paternal Grandmother     lung   History  Substance Use Topics  . Smoking status: Former Smoker    Quit date: 04/14/1993  . Smokeless tobacco: Never Used  . Alcohol Use: 0.0 oz/week    1-2 drink(s) per week     Comment: weekly   OB History   Grav Para Term Preterm Abortions TAB SAB Ect Mult Living                 Review of Systems    Allergies  Review of patient's allergies indicates no known allergies.  Home Medications    Prior to Admission medications   Medication Sig Start Date End Date Taking? Authorizing Provider  ondansetron (ZOFRAN) 4 MG tablet Take 4 mg by mouth every 8 (eight) hours as needed for nausea or vomiting.   Yes Historical Provider, MD  oxyCODONE (OXY IR/ROXICODONE) 5 MG immediate release tablet Take 5-10 mg by mouth every 4 (four) hours as needed for severe pain.   Yes Historical Provider, MD  oxyCODONE-acetaminophen (PERCOCET) 10-325 MG per tablet Take 1 tablet by mouth every 6 (six) hours as needed for pain.   Yes Historical Provider, MD   BP 115/64  Pulse 75  Temp(Src) 99.2 F (37.3 C) (Oral)  Resp 14  Ht 5\' 4"  (1.626 m)  Wt 128 lb (58.06 kg)  BMI 21.96 kg/m2  SpO2 93% Physical Exam  Nursing note and vitals reviewed. Constitutional: She is oriented to person, place, and time. She appears well-developed and well-nourished. She appears distressed.  Patient is in obvious pain.  HENT:  Head: Normocephalic and atraumatic.  Nose: Nose normal.  Mouth/Throat: Oropharynx is clear and moist. No oropharyngeal exudate.  Eyes: Conjunctivae and EOM are normal. Pupils are equal, round, and reactive to light. No scleral icterus.  Neck: Normal range of motion. Neck supple. No  JVD present. No tracheal deviation present. No thyromegaly present.  Cardiovascular: Normal rate, regular rhythm and normal heart sounds.  Exam reveals no gallop and no friction rub.   No murmur heard. Pulmonary/Chest: Effort normal and breath sounds normal. No respiratory distress. She has no wheezes. She exhibits no tenderness.  Abdominal: Soft. Bowel sounds are normal. She exhibits no distension and no mass. There is no tenderness. There is no rebound and no guarding.  Musculoskeletal: Normal range of motion. She exhibits no edema and no tenderness.  Right upper extremity is in a splint and sling. Patient has decreased sensation of the fingertips digits 4 and 5. She has normal strength. There is 2+ radial and ulnar  pulses. There is obvious swelling of the right hand when compared to the left.   Lymphadenopathy:    She has no cervical adenopathy.  Neurological: She is alert and oriented to person, place, and time. No cranial nerve deficit. She exhibits normal muscle tone.  Skin: Skin is warm and dry. No rash noted. She is not diaphoretic. No erythema. No pallor.    ED Course  Procedures (including critical care time) Labs Review Labs Reviewed - No data to display  Imaging Review No results found.   EKG Interpretation None      MDM   Final diagnoses:  None    Patient presents to the emergency department for worsening right upper extremity pain. This is in the setting of recent surgery and splinting. Because of physical exam findings patient's splint is likely on too tight causing an early compartment syndrome. The splint was immediately removed in the emergency department the patient's pain improved. Orthopedic surgery evaluated the patient, they do not feel the patient needs an x-ray and plan to discharge the patient with a muscle relaxant. Ortho discontinued the xray that was ordered, reapplied the patients splint and recommend for "any muscle relaxant to be given for discharge."  She was given valium 2mg  and DC with rx.  I encouraged the patient to follow up with ortho in clinic within 3 days for repeat evaluation.   Her vital signs remain within normal limits and she is safe for discharge in the care of her husband who is driving.    Tomasita CrumbleAdeleke Reilly Molchan, MD 01/26/14 [phone]

## 2014-04-03 ENCOUNTER — Other Ambulatory Visit: Payer: Self-pay | Admitting: Obstetrics and Gynecology

## 2014-04-04 LAB — CYTOLOGY - PAP

## 2015-01-28 ENCOUNTER — Encounter (HOSPITAL_BASED_OUTPATIENT_CLINIC_OR_DEPARTMENT_OTHER): Payer: Self-pay | Admitting: *Deleted

## 2015-01-30 ENCOUNTER — Other Ambulatory Visit: Payer: Self-pay | Admitting: Orthopedic Surgery

## 2015-02-01 ENCOUNTER — Encounter (HOSPITAL_BASED_OUTPATIENT_CLINIC_OR_DEPARTMENT_OTHER)
Admission: RE | Disposition: A | Discharge status: Home / Self Care | Payer: Self-pay | Source: Ambulatory Visit | Attending: Orthopedic Surgery

## 2015-02-01 ENCOUNTER — Ambulatory Visit (HOSPITAL_BASED_OUTPATIENT_CLINIC_OR_DEPARTMENT_OTHER)
Admission: RE | Admit: 2015-02-01 | Discharge: 2015-02-01 | Disposition: A | Discharge status: Home / Self Care | Payer: PRIVATE HEALTH INSURANCE | Source: Ambulatory Visit | Attending: Orthopedic Surgery | Admitting: Orthopedic Surgery

## 2015-02-01 ENCOUNTER — Ambulatory Visit (HOSPITAL_BASED_OUTPATIENT_CLINIC_OR_DEPARTMENT_OTHER): Payer: PRIVATE HEALTH INSURANCE | Admitting: Certified Registered"

## 2015-02-01 ENCOUNTER — Encounter (HOSPITAL_BASED_OUTPATIENT_CLINIC_OR_DEPARTMENT_OTHER): Payer: Self-pay | Admitting: Certified Registered"

## 2015-02-01 DIAGNOSIS — Z87891 Personal history of nicotine dependence: Secondary | ICD-10-CM | POA: Diagnosis not present

## 2015-02-01 DIAGNOSIS — S52021D Displaced fracture of olecranon process without intraarticular extension of right ulna, subsequent encounter for closed fracture with routine healing: Secondary | ICD-10-CM | POA: Insufficient documentation

## 2015-02-01 DIAGNOSIS — Z4589 Encounter for adjustment and management of other implanted devices: Secondary | ICD-10-CM | POA: Diagnosis present

## 2015-02-01 DIAGNOSIS — X58XXXD Exposure to other specified factors, subsequent encounter: Secondary | ICD-10-CM | POA: Diagnosis not present

## 2015-02-01 HISTORY — PX: HARDWARE REMOVAL: SHX979

## 2015-02-01 HISTORY — DX: Anxiety disorder, unspecified: F41.9

## 2015-02-01 SURGERY — REMOVAL, HARDWARE
Anesthesia: General | Site: Elbow | Laterality: Right

## 2015-02-01 MED ORDER — CEFAZOLIN SODIUM-DEXTROSE 2-3 GM-% IV SOLR
INTRAVENOUS | Status: AC
  Filled 2015-02-01: qty 50

## 2015-02-01 MED ORDER — MIDAZOLAM HCL 2 MG/2ML IJ SOLN
INTRAMUSCULAR | Status: AC
  Filled 2015-02-01: qty 4

## 2015-02-01 MED ORDER — LACTATED RINGERS IV SOLN
INTRAVENOUS | Status: DC
  Administered 2015-02-01 (×2): via INTRAVENOUS

## 2015-02-01 MED ORDER — ONDANSETRON HCL 4 MG PO TABS: 4.0000 mg | ORAL_TABLET | Freq: Three times a day (TID) | ORAL | Status: DC | PRN

## 2015-02-01 MED ORDER — FENTANYL CITRATE (PF) 100 MCG/2ML IJ SOLN
50.0000 ug | INTRAMUSCULAR | Status: AC | PRN
  Administered 2015-02-01 (×2): 50 ug via INTRAVENOUS
  Administered 2015-02-01: 25 ug via INTRAVENOUS

## 2015-02-01 MED ORDER — HYDROMORPHONE HCL 1 MG/ML IJ SOLN
INTRAMUSCULAR | Status: AC
  Filled 2015-02-01: qty 1

## 2015-02-01 MED ORDER — 0.9 % SODIUM CHLORIDE (POUR BTL) OPTIME
TOPICAL | Status: DC | PRN
  Administered 2015-02-01: 200 mL

## 2015-02-01 MED ORDER — FENTANYL CITRATE (PF) 100 MCG/2ML IJ SOLN
INTRAMUSCULAR | Status: AC
  Filled 2015-02-01: qty 4

## 2015-02-01 MED ORDER — OXYCODONE HCL 5 MG/5ML PO SOLN: 5.0000 mg | Freq: Once | ORAL | Status: AC | PRN

## 2015-02-01 MED ORDER — OXYCODONE HCL 5 MG PO TABS
ORAL_TABLET | ORAL | Status: AC
  Filled 2015-02-01: qty 1

## 2015-02-01 MED ORDER — METHOCARBAMOL 500 MG PO TABS: 500.0000 mg | ORAL_TABLET | Freq: Four times a day (QID) | ORAL | Status: DC

## 2015-02-01 MED ORDER — DEXAMETHASONE SODIUM PHOSPHATE 10 MG/ML IJ SOLN
INTRAMUSCULAR | Status: DC | PRN
  Administered 2015-02-01: 10 mg via INTRAVENOUS

## 2015-02-01 MED ORDER — CEFAZOLIN SODIUM-DEXTROSE 2-3 GM-% IV SOLR
2.0000 g | INTRAVENOUS | Status: AC
  Administered 2015-02-01: 2 g via INTRAVENOUS

## 2015-02-01 MED ORDER — OXYCODONE-ACETAMINOPHEN 10-325 MG PO TABS: 1.0000 | ORAL_TABLET | Freq: Four times a day (QID) | ORAL | Status: DC | PRN

## 2015-02-01 MED ORDER — LIDOCAINE HCL (CARDIAC) 20 MG/ML IV SOLN
INTRAVENOUS | Status: AC
  Filled 2015-02-01: qty 5

## 2015-02-01 MED ORDER — BUPIVACAINE HCL (PF) 0.5 % IJ SOLN
INTRAMUSCULAR | Status: DC | PRN
  Administered 2015-02-01: 10 mL

## 2015-02-01 MED ORDER — BUPIVACAINE HCL (PF) 0.5 % IJ SOLN
INTRAMUSCULAR | Status: AC
  Filled 2015-02-01: qty 30

## 2015-02-01 MED ORDER — ONDANSETRON HCL 4 MG/2ML IJ SOLN
INTRAMUSCULAR | Status: AC
  Filled 2015-02-01: qty 2

## 2015-02-01 MED ORDER — GLYCOPYRROLATE 0.2 MG/ML IJ SOLN: 0.2000 mg | Freq: Once | INTRAMUSCULAR | Status: DC | PRN

## 2015-02-01 MED ORDER — MEPERIDINE HCL 25 MG/ML IJ SOLN: 6.2500 mg | INTRAMUSCULAR | Status: DC | PRN

## 2015-02-01 MED ORDER — SENNA-DOCUSATE SODIUM 8.6-50 MG PO TABS: 2.0000 | ORAL_TABLET | Freq: Every day | ORAL | Status: DC

## 2015-02-01 MED ORDER — OXYCODONE HCL 5 MG PO TABS
5.0000 mg | ORAL_TABLET | Freq: Once | ORAL | Status: AC | PRN
  Administered 2015-02-01: 5 mg via ORAL

## 2015-02-01 MED ORDER — MIDAZOLAM HCL 2 MG/2ML IJ SOLN
1.0000 mg | INTRAMUSCULAR | Status: DC | PRN
  Administered 2015-02-01: 2 mg via INTRAVENOUS

## 2015-02-01 MED ORDER — PROPOFOL 500 MG/50ML IV EMUL
INTRAVENOUS | Status: AC
  Filled 2015-02-01: qty 50

## 2015-02-01 MED ORDER — FENTANYL CITRATE (PF) 100 MCG/2ML IJ SOLN
INTRAMUSCULAR | Status: AC
  Filled 2015-02-01: qty 2

## 2015-02-01 MED ORDER — HYDROMORPHONE HCL 1 MG/ML IJ SOLN
0.2500 mg | INTRAMUSCULAR | Status: DC | PRN
  Administered 2015-02-01 (×2): 0.5 mg via INTRAVENOUS

## 2015-02-01 MED ORDER — SCOPOLAMINE 1 MG/3DAYS TD PT72: 1.0000 | MEDICATED_PATCH | Freq: Once | TRANSDERMAL | Status: DC | PRN

## 2015-02-01 MED ORDER — ONDANSETRON HCL 4 MG/2ML IJ SOLN
INTRAMUSCULAR | Status: DC | PRN
  Administered 2015-02-01: 4 mg via INTRAVENOUS

## 2015-02-01 MED ORDER — PROPOFOL 10 MG/ML IV BOLUS
INTRAVENOUS | Status: DC | PRN
  Administered 2015-02-01: 200 mg via INTRAVENOUS

## 2015-02-01 MED ORDER — LIDOCAINE HCL (CARDIAC) 20 MG/ML IV SOLN
INTRAVENOUS | Status: DC | PRN
  Administered 2015-02-01: 60 mg via INTRAVENOUS

## 2015-02-01 MED ORDER — MIDAZOLAM HCL 2 MG/2ML IJ SOLN
INTRAMUSCULAR | Status: AC
  Filled 2015-02-01: qty 2

## 2015-02-01 SURGICAL SUPPLY — 67 items
BAG DECANTER FOR FLEXI CONT (MISCELLANEOUS) IMPLANT
BANDAGE ELASTIC 4 VELCRO ST LF (GAUZE/BANDAGES/DRESSINGS) ×3 IMPLANT
BANDAGE ELASTIC 6 VELCRO ST LF (GAUZE/BANDAGES/DRESSINGS) IMPLANT
BANDAGE ESMARK 6X9 LF (GAUZE/BANDAGES/DRESSINGS) IMPLANT
BLADE SURG 15 STRL LF DISP TIS (BLADE) ×2 IMPLANT
BLADE SURG 15 STRL SS (BLADE) ×4
BNDG COHESIVE 4X5 TAN STRL (GAUZE/BANDAGES/DRESSINGS) ×3 IMPLANT
BNDG ESMARK 4X9 LF (GAUZE/BANDAGES/DRESSINGS) ×3 IMPLANT
BNDG ESMARK 6X9 LF (GAUZE/BANDAGES/DRESSINGS)
CANISTER SUCT 1200ML W/VALVE (MISCELLANEOUS) IMPLANT
CLOSURE STERI-STRIP 1/2X4 (GAUZE/BANDAGES/DRESSINGS) ×1
CLSR STERI-STRIP ANTIMIC 1/2X4 (GAUZE/BANDAGES/DRESSINGS) ×2 IMPLANT
COVER BACK TABLE 60X90IN (DRAPES) ×3 IMPLANT
CUFF TOURNIQUET SINGLE 18IN (TOURNIQUET CUFF) ×3 IMPLANT
DECANTER SPIKE VIAL GLASS SM (MISCELLANEOUS) IMPLANT
DRAPE EXTREMITY T 121X128X90 (DRAPE) ×3 IMPLANT
DRAPE IMP U-DRAPE 54X76 (DRAPES) ×3 IMPLANT
DRAPE OEC MINIVIEW 54X84 (DRAPES) ×3 IMPLANT
DRAPE U-SHAPE 47X51 STRL (DRAPES) ×3 IMPLANT
DRAPE U-SHAPE 76X120 STRL (DRAPES) IMPLANT
DURAPREP 26ML APPLICATOR (WOUND CARE) ×3 IMPLANT
ELECT REM PT RETURN 9FT ADLT (ELECTROSURGICAL) ×3
ELECTRODE REM PT RTRN 9FT ADLT (ELECTROSURGICAL) ×1 IMPLANT
GAUZE SPONGE 4X4 12PLY STRL (GAUZE/BANDAGES/DRESSINGS) ×3 IMPLANT
GAUZE XEROFORM 1X8 LF (GAUZE/BANDAGES/DRESSINGS) ×3 IMPLANT
GLOVE BIO SURGEON STRL SZ8 (GLOVE) ×3 IMPLANT
GLOVE BIOGEL M STRL SZ7.5 (GLOVE) ×3 IMPLANT
GLOVE BIOGEL PI IND STRL 8 (GLOVE) ×3 IMPLANT
GLOVE BIOGEL PI INDICATOR 8 (GLOVE) ×6
GLOVE ORTHO TXT STRL SZ7.5 (GLOVE) ×3 IMPLANT
GOWN STRL REUS W/ TWL LRG LVL3 (GOWN DISPOSABLE) IMPLANT
GOWN STRL REUS W/ TWL XL LVL3 (GOWN DISPOSABLE) ×3 IMPLANT
GOWN STRL REUS W/TWL LRG LVL3 (GOWN DISPOSABLE)
GOWN STRL REUS W/TWL XL LVL3 (GOWN DISPOSABLE) ×6
NDL SUT 6 .5 CRC .975X.05 MAYO (NEEDLE) IMPLANT
NEEDLE HYPO 22GX1.5 SAFETY (NEEDLE) IMPLANT
NEEDLE HYPO 25X1 1.5 SAFETY (NEEDLE) ×3 IMPLANT
NEEDLE MAYO TAPER (NEEDLE)
NS IRRIG 1000ML POUR BTL (IV SOLUTION) ×3 IMPLANT
PACK BASIN DAY SURGERY FS (CUSTOM PROCEDURE TRAY) ×3 IMPLANT
PAD CAST 4YDX4 CTTN HI CHSV (CAST SUPPLIES) ×1 IMPLANT
PADDING CAST COTTON 4X4 STRL (CAST SUPPLIES) ×2
PADDING CAST COTTON 6X4 STRL (CAST SUPPLIES) IMPLANT
PENCIL BUTTON HOLSTER BLD 10FT (ELECTRODE) ×3 IMPLANT
SHEET MEDIUM DRAPE 40X70 STRL (DRAPES) ×3 IMPLANT
SLEEVE SCD COMPRESS KNEE MED (MISCELLANEOUS) ×3 IMPLANT
SLING ARM FOAM STRAP MED (SOFTGOODS) ×3 IMPLANT
SPONGE LAP 4X18 X RAY DECT (DISPOSABLE) ×3 IMPLANT
STAPLER VISISTAT (STAPLE) IMPLANT
STAPLER VISISTAT 35W (STAPLE) IMPLANT
STOCKINETTE 4X48 STRL (DRAPES) ×3 IMPLANT
STOCKINETTE IMPERVIOUS LG (DRAPES) IMPLANT
SUCTION FRAZIER TIP 10 FR DISP (SUCTIONS) IMPLANT
SUT MNCRL AB 4-0 PS2 18 (SUTURE) IMPLANT
SUT VIC AB 0 CT1 27 (SUTURE)
SUT VIC AB 0 CT1 27XBRD ANBCTR (SUTURE) IMPLANT
SUT VIC AB 0 SH 27 (SUTURE) ×3 IMPLANT
SUT VIC AB 2-0 SH 27 (SUTURE)
SUT VIC AB 2-0 SH 27XBRD (SUTURE) IMPLANT
SUT VICRYL 3-0 CR8 SH (SUTURE) ×3 IMPLANT
SYR BULB 3OZ (MISCELLANEOUS) ×3 IMPLANT
SYR CONTROL 10ML LL (SYRINGE) ×3 IMPLANT
TOWEL OR 17X24 6PK STRL BLUE (TOWEL DISPOSABLE) ×3 IMPLANT
TUBE CONNECTING 20'X1/4 (TUBING)
TUBE CONNECTING 20X1/4 (TUBING) IMPLANT
UNDERPAD 30X30 (UNDERPADS AND DIAPERS) IMPLANT
YANKAUER SUCT BULB TIP NO VENT (SUCTIONS) IMPLANT

## 2015-02-01 NOTE — H&P (Signed)
PREOPERATIVE H&P  Chief Complaint: Right elbow retained hardware  HPI: Kristina King is a 39 y.o. female who presents for preoperative history and physical with a diagnosis of retained hardware right elbow after olecranon ORIF. These have been symptomatic. Symptoms are rated as moderate to severe, and have been worsening.  This is significantly impairing activities of daily living.  She has elected for surgical management.   Past Medical History  Diagnosis Date  . Depression   . Anxiety    Past Surgical History  Procedure Laterality Date  . Tonsillectomy    . Wrist arthroscopy    . Breast surgery      augmentation  . Elbow surgery    . Tonsillectomy     Social History   Social History  . Marital Status: Married    Spouse Name: N/A  . Number of Children: N/A  . Years of Education: N/A   Social History Main Topics  . Smoking status: Former Smoker    Quit date: 04/14/1993  . Smokeless tobacco: Never Used  . Alcohol Use: 0.0 oz/week    1-2 Standard drinks or equivalent per week     Comment: weekly  . Drug Use: No  . Sexual Activity: Not Asked   Other Topics Concern  . None   Social History Narrative   Family History  Problem Relation Age of Onset  . Hypertension Mother   . Cancer Mother     breast  . Hypertension Father   . Heart attack Neg Hx   . Diabetes Neg Hx   . Cancer Maternal Grandmother     breast  . Cancer Paternal Grandmother     lung   No Known Allergies Prior to Admission medications   Medication Sig Start Date End Date Taking? Authorizing Provider  diazepam (VALIUM) 2 MG tablet Take 1 tablet (2 mg total) by mouth every 12 (twelve) hours as needed for muscle spasms. 01/26/14  Yes Tomasita Crumble, MD  ondansetron (ZOFRAN) 4 MG tablet Take 4 mg by mouth every 8 (eight) hours as needed for nausea or vomiting.   Yes Historical Provider, MD  oxyCODONE (OXY IR/ROXICODONE) 5 MG immediate release tablet Take 5-10 mg by mouth every 4 (four) hours as needed  for severe pain.   Yes Historical Provider, MD  oxyCODONE-acetaminophen (PERCOCET) 10-325 MG per tablet Take 1 tablet by mouth every 6 (six) hours as needed for pain.    Historical Provider, MD     Positive ROS: All other systems have been reviewed and were otherwise negative with the exception of those mentioned in the HPI and as above.  Physical Exam: General: Alert, no acute distress Cardiovascular: No pedal edema Respiratory: No cyanosis, no use of accessory musculature GI: No organomegaly, abdomen is soft and non-tender Skin: No lesions in the area of chief complaint Neurologic: Sensation intact distally Psychiatric: Patient is competent for consent with normal mood and affect Lymphatic: No axillary or cervical lymphadenopathy  MUSCULOSKELETAL: Right elbow has range of motion 5 to 130 with well-healed surgical wounds and full supination and pronation. Positive discomfort when doing resisted triceps activation. She has a palpable pin at the point of the olecranon.  Assessment: Right elbow retained hardware, status post ORIF of olecranon fracture   Plan: Plan for Procedure(s): RIGHT ELBOW HARDWARE REMOVAL  The risks benefits and alternatives were discussed with the patient including but not limited to the risks of nonoperative treatment, versus surgical intervention including infection, bleeding, nerve injury,  blood clots, cardiopulmonary complications,  morbidity, mortality, among others, and they were willing to proceed.   Eulas PostLANDAU,Janelle Spellman P, MD Cell [phone]   02/01/2015 9:42 AM

## 2015-02-01 NOTE — Transfer of Care (Signed)
Immediate Anesthesia Transfer of Care Note  Patient: Kristina BuffaloJennifer L Mabus  Procedure(s) Performed: Procedure(s): RIGHT ELBOW HARDWARE REMOVAL (Right)  Patient Location: PACU  Anesthesia Type:General  Level of Consciousness: awake, alert , oriented and patient cooperative  Airway & Oxygen Therapy: Patient Spontanous Breathing and Patient connected to face mask oxygen  Post-op Assessment: Report given to RN and Post -op Vital signs reviewed and stable  Post vital signs: Reviewed and stable  Last Vitals:  Filed Vitals:   02/01/15 0859  BP: 111/70  Pulse: 54  Temp: 36.9 C  Resp: 16    Complications: No apparent anesthesia complications

## 2015-02-01 NOTE — Op Note (Signed)
02/01/2015  10:55 AM  PATIENT:  Kristina BuffaloJennifer L Crepeau    PRE-OPERATIVE DIAGNOSIS:  Right elbow retained hardware, symptomatic, with broadened scar  POST-OPERATIVE DIAGNOSIS:  Same  PROCEDURE:  RIGHT ELBOW HARDWARE REMOVAL, deep, olecranon pins and wires, with revision of scar.  SURGEON:  Eulas PostLANDAU,Taisei Bonnette P, MD  PHYSICIAN ASSISTANT: Janace LittenBrandon Parry, OPA-C, present and scrubbed throughout the case, critical for completion in a timely fashion, and for retraction, instrumentation, and closure.  ANESTHESIA:   General  PREOPERATIVE INDICATIONS:  Kristina BuffaloJennifer L Kleine is a  39 y.o. female who had a right olecranon fracture and underwent ORIF. She had symptomatic hardware and elected for hardware removal.  The risks benefits and alternatives were discussed with the patient preoperatively including but not limited to the risks of infection, bleeding, nerve injury, cardiopulmonary complications, the need for revision surgery, among others, and the patient was willing to proceed. We also discussed the risks for recurrent fracture, stiffness, among others.  OPERATIVE IMPLANTS: We removed a total of 2 20-gauge wires and 2 K wires  OPERATIVE FINDINGS: Well-healed olecranon fracture  OPERATIVE PROCEDURE: The patient is brought to the operating room and placed in supine position. Gen. anesthesia was administered. IV antibiotics were given. The right upper extremity was prepped and draped in usual sterile fashion. Time out was performed. The arm was elevated and exsanguinated and tourniquet was inflated. Total tourniquet time was approximately 40 minutes. Posterior incision was made over the previous hardware incision. I excised the scar that had widened.  I made a small incision in line with the fibers of the triceps tendon and exposed the 2 posterior wires and these were removed without difficulty. I then localized the 20-gauge wire, cut on one side of the not, and then progressively delivered this through the bone and  tendon edges from below and above. It was fairly difficult to extricate the wires, but I did get them out without significant trauma to the triceps or the ulna. All the hardware was removed and the C-arm picture was taken and then I repaired the fascia with Vicryl followed by Vicryl for the subcutaneous tissue with Steri-Strips and sterile gauze for the skin. She was awakened and returned to the PACU in stable and satisfactory condition. Soft dressing was applied. There were no Occasions. She also had an injection prior to closure.

## 2015-02-01 NOTE — Discharge Instructions (Signed)
Diet: As you were doing prior to hospitalization   Shower:  May shower but keep the wounds dry, use an occlusive plastic wrap, NO SOAKING IN TUB.  If the bandage gets wet, change with a clean dry gauze.  If you have a splint on, leave the splint in place and keep the splint dry with a plastic bag.  Dressing:  You may change your dressing 3-5 days after surgery, unless you have a splint.  If you have a splint, then just leave the splint in place and we will change your bandages during your first follow-up appointment.    If you had hand or foot surgery, we will plan to remove your stitches in about 2 weeks in the office.  For all other surgeries, there are sticky tapes (steri-strips) on your wounds and all the stitches are absorbable.  Leave the steri-strips in place when changing your dressings, they will peel off with time, usually 2-3 weeks.  Activity:  Increase activity slowly as tolerated, but follow the weight bearing instructions below.  The rules on driving is that you can not be taking narcotics while you drive, and you must feel in control of the vehicle.    Weight Bearing:   Ok to use right arm, but keep wounds dry..    To prevent constipation: you may use a stool softener such as -  Colace (over the counter) 100 mg by mouth twice a day  Drink plenty of fluids (prune juice may be helpful) and high fiber foods Miralax (over the counter) for constipation as needed.    Itching:  If you experience itching with your medications, try taking only a single pain pill, or even half a pain pill at a time.  You may take up to 10 pain pills per day, and you can also use benadryl over the counter for itching or also to help with sleep.   Precautions:  If you experience chest pain or shortness of breath - call 911 immediately for transfer to the hospital emergency department!!  If you develop a fever greater that 101 F, purulent drainage from wound, increased redness or drainage from wound, or calf  pain -- Call the office at [phone]                                                Follow- Up Appointment:  Please call for an appointment to be seen in 2 [place] [phone]    Post Anesthesia Home Care Instructions  Activity: Get plenty of rest for the remainder of the day. A responsible adult should stay with you for 24 hours following the procedure.  For the next 24 hours, DO NOT: -Drive a car -Advertising copywriterperate machinery -Drink alcoholic beverages -Take any medication unless instructed by your physician -Make any legal decisions or sign important papers.  Meals: Start with liquid foods such as gelatin or soup. Progress to regular foods as tolerated. Avoid greasy, spicy, heavy foods. If nausea and/or vomiting occur, drink only clear liquids until the nausea and/or vomiting subsides. Call your physician if vomiting continues.  Special Instructions/Symptoms: Your throat may feel dry or sore from the anesthesia or the breathing tube placed in your throat during surgery. If this causes discomfort, gargle with warm salt water. The discomfort should disappear within 24 hours.  If you had a scopolamine patch placed behind  your ear for the management of post- operative nausea and/or vomiting:  1. The medication in the patch is effective for 72 hours, after which it should be removed.  Wrap patch in a tissue and discard in the trash. Wash hands thoroughly with soap and water. 2. You may remove the patch earlier than 72 hours if you experience unpleasant side effects which may include dry mouth, dizziness or visual disturbances. 3. Avoid touching the patch. Wash your hands with soap and water after contact with the patch.

## 2015-02-01 NOTE — Anesthesia Preprocedure Evaluation (Addendum)
Anesthesia Evaluation  Patient identified by MRN, date of birth, ID band  Reviewed: Allergy & Precautions, NPO status , Patient's Chart, lab work & pertinent test results  Airway Mallampati: I  TM Distance: >3 FB Neck ROM: Full    Dental  (+) Teeth Intact, Dental Advisory Given   Pulmonary former smoker,    breath sounds clear to auscultation       Cardiovascular  Rhythm:Regular Rate:Normal     Neuro/Psych PSYCHIATRIC DISORDERS Anxiety Depression    GI/Hepatic   Endo/Other    Renal/GU      Musculoskeletal   Abdominal   Peds  Hematology   Anesthesia Other Findings   Reproductive/Obstetrics                            Anesthesia Physical Anesthesia Plan  ASA: I  Anesthesia Plan: General   Post-op Pain Management:    Induction: Intravenous  Airway Management Planned: LMA  Additional Equipment:   Intra-op Plan:   Post-operative Plan: Extubation in OR  Informed Consent: I have reviewed the patients History and Physical, chart, labs and discussed the procedure including the risks, benefits and alternatives for the proposed anesthesia with the patient or authorized representative who has indicated his/her understanding and acceptance.   Dental advisory given  Plan Discussed with: CRNA, Anesthesiologist and Surgeon  Anesthesia Plan Comments:         Anesthesia Quick Evaluation

## 2015-02-01 NOTE — Anesthesia Procedure Notes (Signed)
Procedure Name: LMA Insertion Date/Time: 02/01/2015 9:58 AM Performed by: Jayliani Wanner D Pre-anesthesia Checklist: Patient identified, Emergency Drugs available, Suction available and Patient being monitored Patient Re-evaluated:Patient Re-evaluated prior to inductionOxygen Delivery Method: Circle System Utilized Preoxygenation: Pre-oxygenation with 100% oxygen Intubation Type: IV induction Ventilation: Mask ventilation without difficulty LMA: LMA inserted LMA Size: 4.0 Number of attempts: 1 Airway Equipment and Method: Bite block Placement Confirmation: positive ETCO2 Tube secured with: Tape Dental Injury: Teeth and Oropharynx as per pre-operative assessment

## 2015-02-01 NOTE — Anesthesia Postprocedure Evaluation (Signed)
  Anesthesia Post-op Note  Patient: Kerrie BuffaloJennifer L Pazos  Procedure(s) Performed: Procedure(s): RIGHT ELBOW HARDWARE REMOVAL (Right)  Patient Location: PACU  Anesthesia Type: General   Level of Consciousness: awake, alert  and oriented  Airway and Oxygen Therapy: Patient Spontanous Breathing  Post-op Pain: mild  Post-op Assessment: Post-op Vital signs reviewed  Post-op Vital Signs: Reviewed  Last Vitals:  Filed Vitals:   02/01/15 1216  BP: 119/74  Pulse: 57  Temp:   Resp: 20    Complications: No apparent anesthesia complications

## 2015-02-04 ENCOUNTER — Encounter (HOSPITAL_BASED_OUTPATIENT_CLINIC_OR_DEPARTMENT_OTHER): Payer: Self-pay | Admitting: Orthopedic Surgery

## 2017-03-16 ENCOUNTER — Other Ambulatory Visit: Payer: Self-pay | Admitting: *Deleted

## 2017-03-16 DIAGNOSIS — M79674 Pain in right toe(s): Secondary | ICD-10-CM

## 2017-03-17 ENCOUNTER — Telehealth: Payer: Self-pay | Admitting: Sports Medicine

## 2017-03-17 ENCOUNTER — Ambulatory Visit
Admission: RE | Admit: 2017-03-17 | Discharge: 2017-03-17 | Disposition: A | Discharge status: Home / Self Care | Payer: Managed Care, Other (non HMO) | Source: Ambulatory Visit | Attending: Sports Medicine | Admitting: Sports Medicine

## 2017-03-17 DIAGNOSIS — M79674 Pain in right toe(s): Secondary | ICD-10-CM

## 2017-03-17 NOTE — Telephone Encounter (Signed)
  Patient called me yesterday after injuring her right second toe. She injured it while moving a washer and dryer. She actually sent me a picture of her foot which showed diffuse ecchymosis and swelling of the second toe. She denies any pain in the foot other than the toe. There is no obvious malalignment. An x-ray was ordered which shows no obvious fracture. I've reassured her of this fact and I recommended buddy taping for comfort. She may increase all activity including running as her pain allows. She will follow-up for ongoing or recalcitrant issues.

## 2018-02-14 ENCOUNTER — Encounter: Payer: Self-pay | Admitting: Sports Medicine

## 2018-02-14 ENCOUNTER — Ambulatory Visit (INDEPENDENT_AMBULATORY_CARE_PROVIDER_SITE_OTHER): Payer: Managed Care, Other (non HMO) | Admitting: Sports Medicine

## 2018-02-14 ENCOUNTER — Ambulatory Visit: Payer: Self-pay

## 2018-02-14 VITALS — BP 111/76 | Ht 64.5 in | Wt 125.0 lb

## 2018-02-14 DIAGNOSIS — M25512 Pain in left shoulder: Secondary | ICD-10-CM

## 2018-02-14 MED ORDER — MELOXICAM 15 MG PO TABS: ORAL_TABLET | ORAL | 0 refills | Status: DC

## 2018-02-14 NOTE — Progress Notes (Signed)
   Subjective:    Patient ID: Kristina BuffaloJennifer L King, female    DOB: [birth year], 42 y.o.   MRN: [id number]  HPI Dicky DoeJennifer Winkels is a 42 year old female presented clinic today for evaluation of left shoulder pain started on Saturday.  Patient does not remember any acute injury or trauma, however she was doing shoulder raises with dumbbells on Saturday morning.  Starting Saturday evening she developed an achy pain in her left shoulder that worsened throughout the night.  She woke up several times that evening with pain had become very severe by Sunday morning.  Patient notes the pain is lateral and radiates anteriorly.  She has no previous injury or trauma to that shoulder however does note chronic scapular pain due to sleeping on her left hand side.  She cannot do any overhead shoulder activity right now due to the pain.  Over the last day the pain has improved mostly because she has not been using it.  She has tried muscle relaxer that she had a leftover prescription from the previous injury but this is not helped.  She did take 600 mg of ibuprofen and notes this did help the pain slightly.  She denies any neck pain but does have pain radiating down the anterior aspect of her arm.  She denies any locking or catching of the shoulder.   Review of Systems General: No fevers, no chills MSK: +L shoulder pain. No neck pain. +weakness at left shoulder Neuro: No numbness/tingling in arms/legs    Objective:   Physical Exam General: No acute distress. Alert, oriented. Resp: Breathing comfortably MSK  L shoulder Palpation: Tenderness to palpation on lateral scapula. No tenderness to palpation along clavicle, supraspinatus fossa, AC joint, or biceps tendon ROM:  -Abduction: 90 degrees. Can increase to 120 degrees with passive ROM -Flexion: 90 degrees. Can increase to 110 degrees with passive ROM -Internal Rotation: Can bring thumb to L5 -External rotation: Normal Strength: 4/5 with flexion, abduction, internal  rotation, external rotation. Limited due to pain Special tests -Neers: Positive -Hawkins: Positive -Empty can: Positive -Yergusons: Negative -Speeds: negative -Obriens: Negative  Neck -Spurlings: Negative      Assessment & Plan:  Dicky DoeJennifer Dolecki is a 42 year old female presented clinic today for evaluation of left shoulder pain started on Saturday.  1. Left shoulder pain -Likely caused by subacromial bursitis -Given lack of mechanism, less concerned about rotator cuff tear -Script for meloxicam given to patient. Advised to not take additional NSAIDs -Patient to perform pendulum exercises at home -If no improvement in 1 week, will perform US in office and consider steroid injection  Follow up in 1 week

## 2018-02-21 ENCOUNTER — Ambulatory Visit (INDEPENDENT_AMBULATORY_CARE_PROVIDER_SITE_OTHER): Payer: Managed Care, Other (non HMO) | Admitting: Sports Medicine

## 2018-02-21 VITALS — BP 98/68 | Ht 64.5 in | Wt 125.0 lb

## 2018-02-21 DIAGNOSIS — M25512 Pain in left shoulder: Secondary | ICD-10-CM | POA: Diagnosis not present

## 2018-02-21 NOTE — Progress Notes (Signed)
   Subjective:    Patient ID: Kristina King, female    DOB: 12/23/1975, 42 y.o.   MRN: [id number]  HPI  Kristina King comes in today for follow-up on left shoulder pain.  Pain has improved dramatically on meloxicam.  She is anxious to return to the gym.   Review of Systems    As above Objective:   Physical Exam  Well-developed, well-nourished.  No acute distress.  Left shoulder: Full painless range of motion.  No tenderness to palpation.  Excellent rotator cuff strength.  Neurovascular intact distally.      Assessment & Plan:   Improved left shoulder pain secondary to rotator cuff tendinitis/subacromial bursitis  Patient will start weaning off of her meloxicam.  She will start Jobe exercises and scapular stabilization exercises.  Avoid overhead activity at the gym for the next 1 to 2 weeks.  If she is unable to wean off of the meloxicam then I would consider a cortisone injection.  As long as she is able to return to her previous level of activity, she may see me as needed.

## 2018-02-21 NOTE — Addendum Note (Signed)
Addended by: Rutha BouchardBABNIK, Camaya Gannett E on: 02/21/2018 10:22 AM   Modules accepted: Orders

## 2018-03-13 ENCOUNTER — Other Ambulatory Visit: Payer: Self-pay | Admitting: Sports Medicine

## 2018-07-18 LAB — HM MAMMOGRAPHY: HM Mammogram: NORMAL (ref 0–4)

## 2018-07-18 LAB — HM PAP SMEAR

## 2019-04-25 ENCOUNTER — Telehealth: Payer: Self-pay | Admitting: *Deleted

## 2019-04-25 NOTE — Telephone Encounter (Signed)
Pt requesting to be seen as New Pt in the future  Stated she been refer by Efraim Kaufmann Low,MD and Elveria Royals

## 2019-04-27 NOTE — Telephone Encounter (Signed)
Please advise 

## 2019-04-27 NOTE — Telephone Encounter (Signed)
Pt called in asking for an update on this request she would also like to know if her daughter could established with Tabori as well. She is almost aged out of the peds office that she attends. Please advise.

## 2019-04-27 NOTE — Telephone Encounter (Signed)
Ok for both for next available new pt slot.  Not an urgent work in situation

## 2019-07-05 ENCOUNTER — Ambulatory Visit: Payer: Managed Care, Other (non HMO) | Admitting: Family Medicine

## 2019-07-18 ENCOUNTER — Ambulatory Visit (INDEPENDENT_AMBULATORY_CARE_PROVIDER_SITE_OTHER): Payer: Managed Care, Other (non HMO) | Admitting: Family Medicine

## 2019-07-18 ENCOUNTER — Telehealth: Payer: Self-pay | Admitting: Family Medicine

## 2019-07-18 ENCOUNTER — Other Ambulatory Visit: Payer: Self-pay

## 2019-07-18 ENCOUNTER — Encounter: Payer: Self-pay | Admitting: Family Medicine

## 2019-07-18 VITALS — BP 122/76 | HR 72 | Temp 97.9°F | Resp 16 | Ht 65.0 in | Wt 137.2 lb

## 2019-07-18 DIAGNOSIS — R635 Abnormal weight gain: Secondary | ICD-10-CM | POA: Diagnosis not present

## 2019-07-18 DIAGNOSIS — F419 Anxiety disorder, unspecified: Secondary | ICD-10-CM | POA: Insufficient documentation

## 2019-07-18 MED ORDER — VORTIOXETINE HBR 10 MG PO TABS
10.0000 mg | ORAL_TABLET | Freq: Every day | ORAL | 3 refills | Status: DC
Start: 2019-07-18 — End: 2019-08-17

## 2019-07-18 NOTE — Patient Instructions (Signed)
Follow up in 1 month to recheck anxiety START the Trintellix once daily Make sure you are taking time for yourself- you deserve it! Call with any questions or concerns Hang in there!!

## 2019-07-18 NOTE — Assessment & Plan Note (Signed)
New to provider, ongoing for pt and worsening recently.  Has a lot of stress due to large life events and COVID.  Reports feeling short tempered, on edge.  Family has noticed.  Given her sexual side effects on Zoloft will start Trintellix in hopes of minimizing/negating these.  Total time spent w/ pt 50 minutes, >50% spent counseling and discussing treatment options.  Pt expressed understanding and is in agreement w/ plan.

## 2019-07-18 NOTE — Progress Notes (Signed)
   Subjective:    Patient ID: Kristina King, female    DOB: [birth year], 44 y.o.   MRN: [id number]  HPI New to establish.  No recent PCP.  GYN- [facility]  Weight gain- pt reports she has gained ~10 lbs in the last year.  Is looking to resume her gym routine which has been on hold due to COVID.  Anxiety- pt has discussed this w/ Dr Henderson Cloud previously.  Not currently on medication but has used medication in the past.  Tends to do better w/ routine and exercise.  Prefers not to take medication if possible.  Has used Xanax in the past but tends to get a headache when it wears off.  Was on Sertraline previously but stopped due to sexual side effects.  Has struggled w/ COVID, daughter is a Holiday representative, son is getting Information systems manager.  Pt has had increased irritability.   Review of Systems For ROS see HPI  This visit occurred during the SARS-CoV-2 public health emergency.  Safety protocols were in place, including screening questions prior to the visit, additional usage of staff PPE, and extensive cleaning of exam room while observing appropriate contact time as indicated for disinfecting solutions.       Objective:   Physical Exam Vitals reviewed.  Constitutional:      General: She is not in acute distress.    Appearance: Normal appearance. She is well-developed.  HENT:     Head: Normocephalic and atraumatic.  Eyes:     Conjunctiva/sclera: Conjunctivae normal.     Pupils: Pupils are equal, round, and reactive to light.  Neck:     Thyroid: No thyromegaly.  Cardiovascular:     Rate and Rhythm: Normal rate and regular rhythm.     Heart sounds: Normal heart sounds. No murmur.  Pulmonary:     Effort: Pulmonary effort is normal. No respiratory distress.     Breath sounds: Normal breath sounds.  Abdominal:     General: There is no distension.     Palpations: Abdomen is soft.     Tenderness: There is no abdominal tenderness.  Musculoskeletal:     Cervical back: Normal range of motion and neck  supple.  Lymphadenopathy:     Cervical: No cervical adenopathy.  Skin:    General: Skin is warm and dry.  Neurological:     Mental Status: She is alert and oriented to person, place, and time.  Psychiatric:        Behavior: Behavior normal.           Assessment & Plan:  Weight gain- pt has gained ~10 lbs in the last year due to COVID and suspension of her regular routine.  Encouraged healthy diet and regular exercise.  Will follow.

## 2019-07-18 NOTE — Telephone Encounter (Signed)
PA paperwork received and will discuss with PCP any meds that pt has tried and failed.

## 2019-07-18 NOTE — Telephone Encounter (Signed)
Please call pt about the Trintellix, she states that the she uses the coupon card it was $102 for a 30 day supply. She wants to take this medication because of the conversation she and Beverely Low had about it. Please call the pt at the home #

## 2019-07-19 NOTE — Telephone Encounter (Signed)
Patient is calling in checking up on this and wanted to speak with Dr.Tabori about the process for the the PA, and figure out what the next steps are.

## 2019-07-19 NOTE — Telephone Encounter (Signed)
Called and left a VM on patients phone to advise that the PA process can take 24 hours to 2 weeks. For approval. If they deny the medication we will fill a new one based on their formulary  recommendations.

## 2019-07-19 NOTE — Telephone Encounter (Signed)
She has used Sertraline in the past and failed.

## 2019-07-19 NOTE — Telephone Encounter (Signed)
PA began in covermymeds today  

## 2019-07-20 ENCOUNTER — Telehealth: Payer: Self-pay

## 2019-07-20 NOTE — Telephone Encounter (Signed)
Opened in error

## 2019-07-20 NOTE — Telephone Encounter (Signed)
Patient called in stating she received a message from her insurance company stating the PA for  Trintillix was not approved. Patient was wondering what the next step would be. Can you/she do an appeal. She states she is not sure if there is another medication comparable to this per her visit with you. Please advise.

## 2019-07-21 MED ORDER — BUPROPION HCL 75 MG PO TABS: 75.0000 mg | ORAL_TABLET | Freq: Two times a day (BID) | ORAL | 3 refills | Status: DC

## 2019-07-21 NOTE — Telephone Encounter (Signed)
Called pt back no answer  

## 2019-07-21 NOTE — Telephone Encounter (Signed)
Prescription sent to pt's pharmacy. 

## 2019-07-21 NOTE — Telephone Encounter (Signed)
Called patient and LMOVM to return call.     

## 2019-07-21 NOTE — Addendum Note (Signed)
Addended by: Sheliah Hatch on: 07/21/2019 04:57 PM   Modules accepted: Orders

## 2019-07-21 NOTE — Telephone Encounter (Signed)
Called pt and LMOVM to return call.  °

## 2019-07-21 NOTE — Telephone Encounter (Signed)
Please ask pt if she would be willing to try low dose, twice daily Wellbutrin to help w/ anxiety.  This doesn't carry any sexual side effects, tends to be more 'activating' (not sedating).  Works differently than the standard SSRIs (which is what she struggled w/ before).  It would be a good alternative option and then if for whatever reason that doesn't work, we can now document that she has failed 2 treatment options.

## 2019-07-21 NOTE — Telephone Encounter (Signed)
Pt said she is willing to try the medication.

## 2019-07-21 NOTE — Telephone Encounter (Signed)
Please return patient's call

## 2019-07-24 ENCOUNTER — Encounter: Payer: Self-pay | Admitting: Family Medicine

## 2019-08-17 ENCOUNTER — Ambulatory Visit (INDEPENDENT_AMBULATORY_CARE_PROVIDER_SITE_OTHER): Payer: Managed Care, Other (non HMO) | Admitting: Family Medicine

## 2019-08-17 ENCOUNTER — Other Ambulatory Visit: Payer: Self-pay

## 2019-08-17 ENCOUNTER — Encounter: Payer: Self-pay | Admitting: Family Medicine

## 2019-08-17 VITALS — BP 113/78 | HR 72 | Temp 98.0°F | Resp 16 | Ht 65.0 in | Wt 134.4 lb

## 2019-08-17 DIAGNOSIS — F419 Anxiety disorder, unspecified: Secondary | ICD-10-CM

## 2019-08-17 MED ORDER — BUPROPION HCL ER (XL) 150 MG PO TB24: 150.0000 mg | ORAL_TABLET | Freq: Every day | ORAL | 3 refills | Status: DC

## 2019-08-17 NOTE — Assessment & Plan Note (Signed)
Improved since starting Wellbutrin.  Still feels there is room for improvement so will increase to 150mg  XR once daily.  Pt expressed understanding and is in agreement w/ plan.

## 2019-08-17 NOTE — Progress Notes (Signed)
   Subjective:    Patient ID: Kristina King, female    DOB: December 03, 1975, 44 y.o.   MRN: [id number]  HPI Anxiety- was started on Wellbutrin b/c insurance did not cover Trintellix.  'I think it's kinda knocked the edge off'.  Pt feels that increasing the dose will be beneficial.  No side effects at this time.  Husband has noticed some improvement.  Pt reports 'we just have so much going on' and she is 'consciously trying to relax'.  Pt feels stress level will 'dramatically improve' after June 11th.   Review of Systems For ROS see HPI   This visit occurred during the SARS-CoV-2 public health emergency.  Safety protocols were in place, including screening questions prior to the visit, additional usage of staff PPE, and extensive cleaning of exam room while observing appropriate contact time as indicated for disinfecting solutions.       Objective:   Physical Exam Vitals reviewed.  Constitutional:      General: She is not in acute distress.    Appearance: Normal appearance.  HENT:     Head: Normocephalic and atraumatic.  Neurological:     General: No focal deficit present.     Mental Status: She is alert and oriented to person, place, and time.  Psychiatric:        Mood and Affect: Mood normal.        Behavior: Behavior normal.        Thought Content: Thought content normal.        Judgment: Judgment normal.           Assessment & Plan:

## 2019-08-17 NOTE — Patient Instructions (Signed)
Schedule your complete physical in 6 months Increase the Wellbutrin to 150mg  daily- new prescription sent Take new dose in the AM w/ food Continue to try and stay in the moment- I'm proud of you! Call with any questions or concerns GOOD LUCK WITH THE UPCOMING FESTIVITIES!!

## 2019-09-15 ENCOUNTER — Other Ambulatory Visit: Payer: Self-pay | Admitting: General Practice

## 2019-09-15 MED ORDER — BUPROPION HCL ER (XL) 150 MG PO TB24: 150.0000 mg | ORAL_TABLET | Freq: Every day | ORAL | 3 refills | Status: DC

## 2019-10-12 ENCOUNTER — Telehealth: Payer: Self-pay | Admitting: Family Medicine

## 2019-10-12 NOTE — Telephone Encounter (Signed)
Please advise 

## 2019-10-12 NOTE — Telephone Encounter (Signed)
Patient called wanting to know if her Wellbutrin XL can be increased - she has been on this dosage for 2 months - Patient states that she has had a couple episodes in the last week.  Please advise.  Last visit 08/17/2019 - next visit 12/16/2019

## 2019-10-13 MED ORDER — BUPROPION HCL ER (XL) 300 MG PO TB24: 300.0000 mg | ORAL_TABLET | Freq: Every day | ORAL | 3 refills | Status: DC

## 2019-10-13 NOTE — Telephone Encounter (Signed)
Ok to increase Wellbutrin XR to 300mg  daily, #30, 3 refills.  She can take 2 of what she has at home and 1 of the new prescription.  Please schedule a f/u appt in 1 month to determine if new dose is working

## 2019-10-13 NOTE — Telephone Encounter (Signed)
Medication filled to pharmacy as requested.   

## 2019-12-13 ENCOUNTER — Telehealth: Payer: Self-pay | Admitting: Family Medicine

## 2019-12-13 NOTE — Telephone Encounter (Signed)
Patient was coming in Friday to discuss her medication - she feels like changes will have to be made and she is on last pill Friday - Please advise - patient doesn't want to reschedule until she speaks with you.

## 2019-12-13 NOTE — Telephone Encounter (Signed)
Called and spoke with patient. She states that since she has been on the dose increase she has noticed that she is waking up with a dull HA almost every morning. Has had increased weight gain. Has been to 2 funerals and dropped her daughter off to college for Freshman year and said she felt unemotional which is unlike her.   Pt said she feels like the previous dose was not strong enough as she was having breakthrough anxiety attacks. And this dose feels to strong.

## 2019-12-14 MED ORDER — BUPROPION HCL ER (XL) 150 MG PO TB24: 150.0000 mg | ORAL_TABLET | Freq: Every day | ORAL | 1 refills | Status: DC

## 2019-12-14 NOTE — Telephone Encounter (Signed)
Oh my!  She has certainly had a lot going on.  At this time, I would suggest we decrease back to the Wellbutrin XL 150mg  daily, #30, 1 refill, to get back to the previous baseline.  Once we are there, we need to schedule an appt to discuss how she is feeling and next best steps.  I suspect that would be 1-2 weeks after changing back to the 150mg 

## 2019-12-14 NOTE — Telephone Encounter (Signed)
Patient notified of PCP recommendations and is agreement and expresses an understanding. Medication filled to pharmacy as requested.  Pt will call back to schedule as she has a lot going on in October and she needs to check her schedule.

## 2019-12-15 ENCOUNTER — Ambulatory Visit: Payer: Managed Care, Other (non HMO) | Admitting: Family Medicine

## 2020-02-14 ENCOUNTER — Telehealth: Payer: Self-pay | Admitting: Family Medicine

## 2020-02-14 ENCOUNTER — Other Ambulatory Visit: Payer: Self-pay

## 2020-02-14 ENCOUNTER — Telehealth: Payer: Self-pay

## 2020-02-14 DIAGNOSIS — F419 Anxiety disorder, unspecified: Secondary | ICD-10-CM

## 2020-02-14 MED ORDER — BUPROPION HCL ER (XL) 150 MG PO TB24: 150.0000 mg | ORAL_TABLET | Freq: Every day | ORAL | 1 refills | Status: DC

## 2020-02-14 NOTE — Telephone Encounter (Signed)
Pt will need a refill on the bupropion, we had to rs her appt to 03/12/20, she uses [facility] on [address].

## 2020-02-14 NOTE — Telephone Encounter (Signed)
Rx has been sent to pharmacy. Pt has been notified and will call if she has any questions or concerns.

## 2020-02-14 NOTE — Telephone Encounter (Signed)
Returned patients call about her Rx of Wellbutrin. Call addressed. Pt will call back with any questions or concerns.

## 2020-02-15 ENCOUNTER — Encounter: Payer: Managed Care, Other (non HMO) | Admitting: Family Medicine

## 2020-02-15 ENCOUNTER — Telehealth: Payer: Self-pay

## 2020-02-15 ENCOUNTER — Other Ambulatory Visit: Payer: Self-pay

## 2020-02-15 DIAGNOSIS — F419 Anxiety disorder, unspecified: Secondary | ICD-10-CM

## 2020-02-15 MED ORDER — BUPROPION HCL ER (XL) 150 MG PO TB24: 150.0000 mg | ORAL_TABLET | Freq: Every day | ORAL | 1 refills | Status: DC

## 2020-02-15 NOTE — Telephone Encounter (Signed)
Spoke with patient about Rx. Pharmacy has been changed and Rx been sent.

## 2020-02-15 NOTE — Telephone Encounter (Signed)
Spoke with patient about Rx. Rx needed to be sent to another pharmacy due to first location being closed.

## 2020-02-15 NOTE — Telephone Encounter (Signed)
Pt called in asking if we can send the Wellbutrin to the [facility] on [address].    Please call the pt once this is done.

## 2020-03-11 ENCOUNTER — Telehealth: Payer: Self-pay | Admitting: Family Medicine

## 2020-03-11 NOTE — Telephone Encounter (Signed)
Pt called in stating that she had to reschedule her cpe from 12/14 to 04/24/20 due to her husband having covid. She wanted to see if Beverely Low would send in a refill of the wellbutrin XL ( not needed until Jan)?  She wanted to let us know she also got her Pfizer covid booster on 03/09/20 at [facility].

## 2020-03-12 ENCOUNTER — Encounter: Payer: Managed Care, Other (non HMO) | Admitting: Family Medicine

## 2020-03-12 ENCOUNTER — Telehealth: Payer: Self-pay

## 2020-03-12 ENCOUNTER — Other Ambulatory Visit: Payer: Self-pay

## 2020-03-12 DIAGNOSIS — F419 Anxiety disorder, unspecified: Secondary | ICD-10-CM

## 2020-03-12 MED ORDER — BUPROPION HCL ER (XL) 150 MG PO TB24: 150.0000 mg | ORAL_TABLET | Freq: Every day | ORAL | 1 refills | Status: DC

## 2020-03-12 NOTE — Telephone Encounter (Signed)
Ok to send Wellbutrin XL #30, 1 refill.  I hope everyone feels better soon!

## 2020-03-12 NOTE — Telephone Encounter (Signed)
Pt called stating that she had to reschedule her CPE to 04/25/19 from 03/12/20 due to her husband having covid. However she wanted to know if she could get a refill of Wellbuturin XL. Not needed until January. Her last refill was 02/15/20 for a 30 day supply

## 2020-03-12 NOTE — Telephone Encounter (Signed)
Called and spoke with pt regarding her Wellbuturin XL. Per Dr. Beverely Low, ok to refill. Appt scheduled for 04/24/2020

## 2020-03-12 NOTE — Telephone Encounter (Signed)
Rx Wellbuturin XL sent to pharmacy per Dr. Beverely Low

## 2020-04-17 ENCOUNTER — Telehealth (INDEPENDENT_AMBULATORY_CARE_PROVIDER_SITE_OTHER): Payer: BC Managed Care – PPO | Admitting: Family Medicine

## 2020-04-17 ENCOUNTER — Encounter: Payer: Self-pay | Admitting: Family Medicine

## 2020-04-17 DIAGNOSIS — R002 Palpitations: Secondary | ICD-10-CM | POA: Diagnosis not present

## 2020-04-17 NOTE — Progress Notes (Signed)
I connected with  Kristina King on 04/17/20 by a video enabled telemedicine application and verified that I am speaking with the correct person using two identifiers.   I discussed the limitations of evaluation and management by telemedicine. The patient expressed understanding and agreed to proceed.

## 2020-04-17 NOTE — Progress Notes (Signed)
Virtual Visit via Video   I connected with patient on 04/17/20 at  1:30 PM EST by a video enabled telemedicine application and verified that I am speaking with the correct person using two identifiers.  Location patient: Home Location provider: [facility], Office Persons participating in the virtual visit: Patient, Provider, [place] (Sabrina M)  I discussed the limitations of evaluation and management by telemedicine and the availability of in person appointments. The patient expressed understanding and agreed to proceed.  Subjective:   HPI:   Heart palpitations- pt initially had palpitations when she started Wellbutrin last spring.  These subsided and she was doing well until recently when palpitations dramatically increased.  Sxs worsened within the last 2 weeks.  Around this time, she restarted her Chromium 425mcg.  Denies increased stressors.  No change in caffeine intake- 1 cup of green tea daily.  Palpitations tend to occur later in the day.  Denies SOB, chest tightness, dizziness.  At times will cause her to cough.  ROS:   See pertinent positives and negatives per HPI.  Patient Active Problem List   Diagnosis Date Noted  . Anxiety 07/18/2019  . Multiple thyroid nodules 04/14/2013  . RUPTURE, QUADRICEPS TENDON 05/19/2010  . LUMBAR SPRAIN AND STRAIN 05/19/2010  . 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 Smoking status: Former Smoker    Quit date: 04/14/1993    Years since quitting: 27.0  . Smokeless tobacco: Never Used  Substance Use Topics  . Alcohol use: Yes    Alcohol/week: 1.0 - 2.0 standard drink    Types: 1 - 2 Standard drinks or equivalent per week    Comment: weekly    Current Outpatient Medications:  .  APPLE CIDER VINEGAR PO, Take by mouth., Disp: , Rfl:  .  Ashwagandha 500 MG CAPS, Take 2 capsules by mouth daily., Disp: , Rfl:  .  Barberry-Oreg Grape-Goldenseal (BERBERINE COMPLEX) 200-200-50 MG CAPS, Take by mouth., Disp: ,  Rfl:  .  buPROPion (WELLBUTRIN XL) 150 MG 24 hr tablet, Take 1 tablet (150 mg total) by mouth daily., Disp: 30 tablet, Rfl: 1 .  Chromium Picolinate 200 MCG CAPS, Take 2 capsules by mouth daily., Disp: , Rfl:  .  COLLAGEN PO, Take 2,500 mg by mouth daily., Disp: , Rfl:  .  ELDERBERRY PO, Take by mouth., Disp: , Rfl:  .  fluticasone (FLONASE) 50 MCG/ACT nasal spray, Place into both nostrils daily., Disp: , Rfl:  .  ID NOW COVID-19 KIT, TEST AS DIRECTED TODAY, Disp: , Rfl:  .  Lactobacillus-Inulin (CULTURELLE DIGESTIVE DAILY PO), Take by mouth., Disp: , Rfl:  .  Multiple Vitamin (MULTIVITAMIN ADULT PO), Take by mouth., Disp: , Rfl:  .  vitamin B-12 (CYANOCOBALAMIN) 500 MCG tablet, Take 500 mcg by mouth daily., Disp: , Rfl:  .  Vitamin D, Cholecalciferol, 50 MCG (2000 UT) CAPS, Take 2 capsules by mouth daily., Disp: , Rfl:   No Known Allergies  Objective:   There were no vitals taken for this visit.  AAOx3, NAD NCAT, EOMI No obvious CN deficits Coloring WNL Pt is able to speak clearly, coherently without shortness of breath or increased work of breathing.  Thought process is linear.  Mood is appropriate.   Assessment and Plan:   Palpitations- new.  Suspect this is due to the re-introduction of chromium at fairly high levels (443mcg).  Discussed that chromium is often used to increase metabolism and can have some stimulant properties  at high doses- including palpitations.  I don't think this has to do w/ her Wellbutrin as she has been on this consistently for nearly a year.  Encouraged her to stop the Chromium supplement immediately and monitor her sxs until her appt next week.  At that time, we will do labs and EKG and refer to cards if needed.  Pt expressed understanding and is in agreement w/ plan.   Annye Asa, MD 04/17/2020

## 2020-04-24 ENCOUNTER — Encounter: Payer: Self-pay | Admitting: Family Medicine

## 2020-04-24 ENCOUNTER — Other Ambulatory Visit: Payer: Self-pay

## 2020-04-24 ENCOUNTER — Ambulatory Visit (INDEPENDENT_AMBULATORY_CARE_PROVIDER_SITE_OTHER): Payer: BC Managed Care – PPO | Admitting: Family Medicine

## 2020-04-24 VITALS — BP 110/70 | HR 66 | Temp 98.0°F | Resp 17 | Ht 63.0 in | Wt 135.8 lb

## 2020-04-24 DIAGNOSIS — Z1211 Encounter for screening for malignant neoplasm of colon: Secondary | ICD-10-CM | POA: Diagnosis not present

## 2020-04-24 DIAGNOSIS — Z23 Encounter for immunization: Secondary | ICD-10-CM

## 2020-04-24 DIAGNOSIS — F419 Anxiety disorder, unspecified: Secondary | ICD-10-CM | POA: Diagnosis not present

## 2020-04-24 DIAGNOSIS — R002 Palpitations: Secondary | ICD-10-CM | POA: Diagnosis not present

## 2020-04-24 DIAGNOSIS — Z0000 Encounter for general adult medical examination without abnormal findings: Secondary | ICD-10-CM | POA: Diagnosis not present

## 2020-04-24 LAB — LIPID PANEL
Cholesterol: 176 mg/dL (ref 0–200)
HDL: 70.5 mg/dL (ref >39.00)
LDL Cholesterol: 97 mg/dL (ref 0–99)
NonHDL: 105.32
Total CHOL/HDL Ratio: 2
Triglycerides: 43 mg/dL (ref 0.0–149.0)
VLDL: 8.6 mg/dL (ref 0.0–40.0)

## 2020-04-24 LAB — CBC WITH DIFFERENTIAL/PLATELET
Basophils Absolute: 0 10*3/uL (ref 0.0–0.1)
Basophils Relative: 0.4 % (ref 0.0–3.0)
Eosinophils Absolute: 0 10*3/uL (ref 0.0–0.7)
Eosinophils Relative: 0.5 % (ref 0.0–5.0)
HCT: 39.2 % (ref 36.0–46.0)
Hemoglobin: 13.2 g/dL (ref 12.0–15.0)
Lymphocytes Relative: 31.1 % (ref 12.0–46.0)
Lymphs Abs: 1.7 10*3/uL (ref 0.7–4.0)
MCHC: 33.8 g/dL (ref 30.0–36.0)
MCV: 89.6 fl (ref 78.0–100.0)
Monocytes Absolute: 0.3 10*3/uL (ref 0.1–1.0)
Monocytes Relative: 5.3 % (ref 3.0–12.0)
Neutro Abs: 3.4 10*3/uL (ref 1.4–7.7)
Neutrophils Relative %: 62.7 % (ref 43.0–77.0)
Platelets: 161 10*3/uL (ref 150.0–400.0)
RBC: 4.37 Mil/uL (ref 3.87–5.11)
RDW: 12.7 % (ref 11.5–15.5)
WBC: 5.4 10*3/uL (ref 4.0–10.5)

## 2020-04-24 LAB — BASIC METABOLIC PANEL
BUN: 19 mg/dL (ref 6–23)
CO2: 28 mEq/L (ref 19–32)
Calcium: 9.5 mg/dL (ref 8.4–10.5)
Chloride: 103 mEq/L (ref 96–112)
Creatinine, Ser: 0.94 mg/dL (ref 0.40–1.20)
GFR: 73.11 mL/min (ref >60.00)
Glucose, Bld: 81 mg/dL (ref 70–99)
Potassium: 4 mEq/L (ref 3.5–5.1)
Sodium: 136 mEq/L (ref 135–145)

## 2020-04-24 LAB — HEPATIC FUNCTION PANEL
ALT: 11 U/L (ref 0–35)
AST: 19 U/L (ref 0–37)
Albumin: 4.3 g/dL (ref 3.5–5.2)
Alkaline Phosphatase: 41 U/L (ref 39–117)
Bilirubin, Direct: 0.1 mg/dL (ref 0.0–0.3)
Total Bilirubin: 0.7 mg/dL (ref 0.2–1.2)
Total Protein: 7 g/dL (ref 6.0–8.3)

## 2020-04-24 LAB — TSH: TSH: 1.45 u[IU]/mL (ref 0.35–4.50)

## 2020-04-24 MED ORDER — BUPROPION HCL ER (XL) 150 MG PO TB24: 150.0000 mg | ORAL_TABLET | Freq: Every day | ORAL | 1 refills | Status: DC

## 2020-04-24 NOTE — Assessment & Plan Note (Signed)
Pt's PE WNL.  UTD on mammo, GYN.  Tdap given today.  Due to start colon cancer screening- referral placed.  Check labs.  Anticipatory guidance provided.

## 2020-04-24 NOTE — Patient Instructions (Signed)
Follow up as needed or in 1 year for physical We'll notify you of your lab results and make any changes if needed Keep up the good work!  You look great! We'll call you with your GI referral to discuss colonoscopy START daily Claritin or Zyrtec If the palpitations change or worsen, let me know! Call with any questions or concerns Stay Safe!  Stay Healthy! Happy Spring!!

## 2020-04-24 NOTE — Addendum Note (Signed)
Addended by: Joylene Igo L on: 04/24/2020 01:26 PM   Modules accepted: Orders

## 2020-04-24 NOTE — Progress Notes (Signed)
   Subjective:    Patient ID: Kristina King, female    DOB: 1976/03/08, 45 y.o.   MRN: [id number]  HPI CPE- UTD on pap, mamm[facility] ([facility]), flu, COVID.  Due for Tdap and colon cancer screening.  Reviewed past medical, surgical, family and social histories.   Health Maintenance  Topic Date Due  . INFLUENZA VACCINE  10/29/2019  . Hepatitis C Screening  07/02/2020 (Originally 06/30/1975)  . COLONOSCOPY (Pts 45-65yrs Insurance coverage will need to be confirmed)  07/03/2020 (Originally 06/20/2019)  . TETANUS/TDAP  07/17/2020 (Originally 06/19/1993)  . HIV Screening  07/17/2020 (Originally 06/19/1989)  . PAP SMEAR-Modifier  07/24/2022  . COVID-19 Vaccine  Completed      Review of Systems Patient reports no vision/ hearing changes, adenopathy,fever, weight change,  persistant/recurrent hoarseness, swallowing issues, chest pain, edema, hemoptysis, dyspnea (rest/exertional/paroxysmal nocturnal), gastrointestinal bleeding (melena, rectal bleeding), abdominal pain, significant heartburn, bowel changes, GU symptoms (dysuria, hematuria, incontinence), Gyn symptoms (abnormal  bleeding, pain),  syncope, focal weakness, memory loss, numbness & tingling, skin/hair/nail changes, abnormal bruising or bleeding, anxiety, or depression.   + palpitations- improving since stopping chromium + persistent cough since 2020- + nasal congestion and sneezing  This visit occurred during the SARS-CoV-2 public health emergency.  Safety protocols were in place, including screening questions prior to the visit, additional usage of staff PPE, and extensive cleaning of exam room while observing appropriate contact time as indicated for disinfecting solutions.       Objective:   Physical Exam General Appearance:    Alert, cooperative, no distress, appears stated age  Head:    Normocephalic, without obvious abnormality, atraumatic  Eyes:    PERRL, conjunctiva/corneas clear, EOM's intact, fundi    benign, both  eyes  Ears:    Normal TM's and external ear canals, both ears  Nose:   Deferred due to COVID  Throat:   Neck:   Supple, symmetrical, trachea midline, no adenopathy;    Thyroid: no enlargement/tenderness/nodules  Back:     Symmetric, no curvature, ROM normal, no CVA tenderness  Lungs:     Clear to auscultation bilaterally, respirations unlabored  Chest Wall:    No tenderness or deformity   Heart:    Regular rate and rhythm, S1 and S2 normal, no murmur, rub   or gallop  Breast Exam:    Deferred to GYN  Abdomen:     Soft, non-tender, bowel sounds active all four quadrants,    no masses, no organomegaly  Genitalia:    Deferred to GYN  Rectal:    Extremities:   Extremities normal, atraumatic, no cyanosis or edema  Pulses:   2+ and symmetric all extremities  Skin:   Skin color, texture, turgor normal, no rashes or lesions  Lymph nodes:   Cervical, supraclavicular, and axillary nodes normal  Neurologic:   CNII-XII intact, normal strength, sensation and reflexes    throughout          Assessment & Plan:

## 2020-04-30 ENCOUNTER — Encounter: Payer: Self-pay | Admitting: Gastroenterology

## 2020-05-13 ENCOUNTER — Other Ambulatory Visit: Payer: Self-pay

## 2020-05-13 ENCOUNTER — Ambulatory Visit (AMBULATORY_SURGERY_CENTER): Payer: Self-pay | Admitting: *Deleted

## 2020-05-13 VITALS — Ht 63.0 in | Wt 139.0 lb

## 2020-05-13 DIAGNOSIS — Z1211 Encounter for screening for malignant neoplasm of colon: Secondary | ICD-10-CM

## 2020-05-13 MED ORDER — PLENVU 140 G PO SOLR: 1.0000 | Freq: Once | ORAL | 0 refills | Status: AC

## 2020-05-13 NOTE — Progress Notes (Signed)
Patient is here in-person for PV. Patient denies any allergies to eggs or soy. Patient denies any problems with anesthesia/sedation. Patient denies any oxygen use at home. Patient denies taking any diet/weight loss medications or blood thinners. Patient is not being treated for MRSA or C-diff. Patient is aware of our care-partner policy and Covid-19 safety protocol..   COVID-19 vaccines completed x3, per patient.   Prep Prescription coupon given to the patient. 

## 2020-05-22 ENCOUNTER — Encounter: Payer: Self-pay | Admitting: Gastroenterology

## 2020-05-24 ENCOUNTER — Telehealth: Payer: Self-pay | Admitting: Gastroenterology

## 2020-05-24 ENCOUNTER — Telehealth: Payer: Self-pay | Admitting: Family Medicine

## 2020-05-24 NOTE — Telephone Encounter (Signed)
Patient called to cancel procedure for Monday said her insurance will not be covering at 100% due to letter received.

## 2020-05-24 NOTE — Telephone Encounter (Signed)
Patient calls and states that her colonoscopy if scheduled for Monday, but her insurance will not cover this because of the way it was coded.  Patient is going to call and cancel procedure, but she would like this coded in someway that will make it covered by her insurance.  Please Advise

## 2020-05-27 ENCOUNTER — Encounter: Payer: BC Managed Care – PPO | Admitting: Gastroenterology

## 2020-05-27 NOTE — Telephone Encounter (Signed)
I hate to hear that there are insurance issues, but this would be something to discuss w/ GI.  All we do is make the referral and the rest is handled by their office.

## 2020-05-27 NOTE — Telephone Encounter (Signed)
Patient called in regards to her colonoscopy. She had to cancel because insurance is refusing to pay according to how it was coded. Please advise.

## 2020-05-28 NOTE — Telephone Encounter (Signed)
Called and spoke with patient about concerns. Patient stated that she is in touch with [facility] telling her story because [employer] is the only insurance company that is not recognizing the age of 66 to cover colonoscopy. She also stated that she is going to give it a little more time and she will reschedule her appt for her colonoscopy.

## 2020-07-29 DIAGNOSIS — Z01419 Encounter for gynecological examination (general) (routine) without abnormal findings: Secondary | ICD-10-CM | POA: Diagnosis not present

## 2020-07-29 DIAGNOSIS — Z9189 Other specified personal risk factors, not elsewhere classified: Secondary | ICD-10-CM | POA: Diagnosis not present

## 2020-07-29 DIAGNOSIS — Z6824 Body mass index (BMI) 24.0-24.9, adult: Secondary | ICD-10-CM | POA: Diagnosis not present

## 2020-08-01 ENCOUNTER — Other Ambulatory Visit: Payer: Self-pay | Admitting: Obstetrics and Gynecology

## 2020-08-01 DIAGNOSIS — Z1231 Encounter for screening mammogram for malignant neoplasm of breast: Secondary | ICD-10-CM

## 2020-08-05 ENCOUNTER — Other Ambulatory Visit: Payer: Self-pay

## 2020-08-05 DIAGNOSIS — F419 Anxiety disorder, unspecified: Secondary | ICD-10-CM

## 2020-08-05 MED ORDER — BUPROPION HCL ER (XL) 150 MG PO TB24: 150.0000 mg | ORAL_TABLET | Freq: Every day | ORAL | 0 refills | Status: DC

## 2020-08-30 ENCOUNTER — Ambulatory Visit (AMBULATORY_SURGERY_CENTER): Payer: Self-pay

## 2020-08-30 ENCOUNTER — Other Ambulatory Visit: Payer: Self-pay

## 2020-08-30 VITALS — Ht 63.0 in | Wt 139.0 lb

## 2020-08-30 DIAGNOSIS — Z1211 Encounter for screening for malignant neoplasm of colon: Secondary | ICD-10-CM

## 2020-08-30 NOTE — Progress Notes (Signed)
No allergies to soy or egg Pt is not on blood thinners or diet pills Denies issues with sedation/intubation Denies atrial flutter/fib Denies constipation   Emmi instructions given to pt  Pt is aware of Covid safety and care partner requirements.  

## 2020-09-16 ENCOUNTER — Other Ambulatory Visit: Payer: Self-pay | Admitting: Family Medicine

## 2020-09-16 DIAGNOSIS — F419 Anxiety disorder, unspecified: Secondary | ICD-10-CM

## 2020-09-17 ENCOUNTER — Encounter: Payer: Self-pay | Admitting: Gastroenterology

## 2020-09-19 ENCOUNTER — Encounter: Payer: Self-pay | Admitting: Certified Registered Nurse Anesthetist

## 2020-09-20 ENCOUNTER — Encounter: Payer: Self-pay | Admitting: Gastroenterology

## 2020-09-20 ENCOUNTER — Other Ambulatory Visit: Payer: Self-pay

## 2020-09-20 ENCOUNTER — Ambulatory Visit (AMBULATORY_SURGERY_CENTER): Payer: BC Managed Care – PPO | Admitting: Gastroenterology

## 2020-09-20 VITALS — BP 108/67 | HR 64 | Temp 97.8°F | Resp 20 | Ht 63.0 in | Wt 139.0 lb

## 2020-09-20 DIAGNOSIS — Z1211 Encounter for screening for malignant neoplasm of colon: Secondary | ICD-10-CM | POA: Diagnosis not present

## 2020-09-20 MED ORDER — SODIUM CHLORIDE 0.9 % IV SOLN: 500.0000 mL | Freq: Once | INTRAVENOUS | Status: DC

## 2020-09-20 NOTE — Op Note (Signed)
[facility] Patient Name: Kristina King Procedure Date: 09/20/2020 8:40 AM MRN: [id number] Endoscopist: Sherilyn Cooter L. Myrtie Neither , MD Age: 45 Referring MD:  Date of Birth: [birth year] Gender: Female Account #: [id number] Procedure:                Colonoscopy Indications:              Screening for colorectal malignant neoplasm, This                            is the patient's first colonoscopy Medicines:                Monitored Anesthesia Care Procedure:                Pre-Anesthesia Assessment:                           - Prior to the procedure, a History and Physical                            was performed, and patient medications and                            allergies were reviewed. The patient's tolerance of                            previous anesthesia was also reviewed. The risks                            and benefits of the procedure and the sedation                            options and risks were discussed with the patient.                            All questions were answered, and informed consent                            was obtained. Prior Anticoagulants: The patient has                            taken no previous anticoagulant or antiplatelet                            agents. ASA Grade Assessment: II - A patient with                            mild systemic disease. After reviewing the risks                            and benefits, the patient was deemed in                            satisfactory condition to undergo the procedure.  After obtaining informed consent, the colonoscope                            was passed under direct vision. Throughout the                            procedure, the patient's blood pressure, pulse, and                            oxygen saturations were monitored continuously. The                            Olympus PCF-H190DL ([id number]) Colonoscope was                            introduced through the  anus and advanced to the the                            cecum, identified by appendiceal orifice and                            ileocecal valve. The colonoscopy was somewhat                            difficult due to a redundant colon. Successful                            completion of the procedure was aided by changing                            the patient to a supine position and using manual                            pressure. The patient tolerated the procedure well.                            The quality of the bowel preparation was excellent.                            The ileocecal valve, appendiceal orifice, and                            rectum were photographed. The bowel preparation                            used was Plenvu. Scope In: 8:47:33 AM Scope Out: 9:03:17 AM Scope Withdrawal Time: 0 hours 8 minutes 59 seconds  Total Procedure Duration: 0 hours 15 minutes 44 seconds  Findings:                 The perianal and digital rectal examinations were                            normal.  A single diverticulum was found in the proximal                            ascending colon.                           The exam was otherwise without abnormality on                            direct and retroflexion views. Complications:            No immediate complications. Estimated Blood Loss:     Estimated blood loss: none. Impression:               - Diverticulosis in the proximal ascending colon.                           - The examination was otherwise normal on direct                            and retroflexion views.                           - No specimens collected. Recommendation:           - Patient has a contact number available for                            emergencies. The signs and symptoms of potential                            delayed complications were discussed with the                            patient. Return to normal activities tomorrow.                             Written discharge instructions were provided to the                            patient.                           - Resume previous diet.                           - Continue present medications.                           - Repeat colonoscopy in 10 years for screening                            purposes. Sindi Beckworth L. Myrtie Neither, MD 09/20/2020 9:06:35 AM This report has been signed electronically.

## 2020-09-20 NOTE — Patient Instructions (Signed)
Please read handouts provided. ?Continue present medications. ?Repeat colonoscopy in 10 years for screening. ? ? ?YOU HAD AN ENDOSCOPIC PROCEDURE TODAY AT THE [facility]:   Refer to the procedure report that was given to you for any specific questions about what was found during the examination.  If the procedure report does not answer your questions, please call your gastroenterologist to clarify.  If you requested that your care partner not be given the details of your procedure findings, then the procedure report has been included in a sealed envelope for you to review at your convenience later. ? ?YOU SHOULD EXPECT: Some feelings of bloating in the abdomen. Passage of more gas than usual.  Walking can help get rid of the air that was put into your GI tract during the procedure and reduce the bloating. If you had a lower endoscopy (such as a colonoscopy or flexible sigmoidoscopy) you may notice spotting of blood in your stool or on the toilet paper. If you underwent a bowel prep for your procedure, you may not have a normal bowel movement for a few days. ? ?Please Note:  You might notice some irritation and congestion in your nose or some drainage.  This is from the oxygen used during your procedure.  There is no need for concern and it should clear up in a day or so. ? ?SYMPTOMS TO REPORT IMMEDIATELY: ? ?Following lower endoscopy (colonoscopy or flexible sigmoidoscopy): ? Excessive amounts of blood in the stool ? Significant tenderness or worsening of abdominal pains ? Swelling of the abdomen that is new, acute ? Fever of 100?F or higher ? ? ?For urgent or emergent issues, a gastroenterologist can be reached at any hour by calling [phone]. ?Do not use MyChart messaging for urgent concerns.  ? ? ?DIET:  We do recommend a small meal at first, but then you may proceed to your regular diet.  Drink plenty of fluids but you should avoid alcoholic beverages for 24 hours. ? ?ACTIVITY:  You should  plan to take it easy for the rest of today and you should NOT DRIVE or use heavy machinery until tomorrow (because of the sedation medicines used during the test).   ? ?FOLLOW UP: ?Our staff will call the number listed on your records 48-72 hours following your procedure to check on you and address any questions or concerns that you may have regarding the information given to you following your procedure. If we do not reach you, we will leave a message.  We will attempt to reach you two times.  During this call, we will ask if you have developed any symptoms of COVID 19. If you develop any symptoms (ie: fever, flu-like symptoms, shortness of breath, cough etc.) before then, please call [phone].  If you test positive for Covid 19 in the 2 weeks post procedure, please call and report this information to us.   ? ?If any biopsies were taken you will be contacted by phone or by letter within the next 1-3 weeks.  Please call us at [phone] if you have not heard about the biopsies in 3 weeks.  ? ? ?SIGNATURES/CONFIDENTIALITY: ?You and/or your care partner have signed paperwork which will be entered into your electronic medical record.  These signatures attest to the fact that that the information above on your After Visit Summary has been reviewed and is understood.  Full responsibility of the confidentiality of this discharge information lies with you and/or your care-partner.  ?

## 2020-09-20 NOTE — Progress Notes (Signed)
Report given to PACU, vss 

## 2020-09-20 NOTE — Progress Notes (Signed)
Pt's states no medical or surgical changes since previsit or office visit. 

## 2020-09-24 ENCOUNTER — Telehealth: Payer: Self-pay

## 2020-09-24 ENCOUNTER — Telehealth: Payer: Self-pay | Admitting: *Deleted

## 2020-09-24 NOTE — Telephone Encounter (Signed)
First attempt follow up call to pt, no answer. 

## 2020-09-24 NOTE — Telephone Encounter (Signed)
Attempted 2nd f/u phone call. No answer. Mailbox is full, unable to leave message.  °

## 2020-09-25 ENCOUNTER — Encounter: Payer: Self-pay | Admitting: *Deleted

## 2020-09-26 ENCOUNTER — Ambulatory Visit
Admission: RE | Admit: 2020-09-26 | Discharge: 2020-09-26 | Disposition: A | Discharge status: Home / Self Care | Payer: BC Managed Care – PPO | Source: Ambulatory Visit | Attending: Obstetrics and Gynecology | Admitting: Obstetrics and Gynecology

## 2020-09-26 ENCOUNTER — Other Ambulatory Visit: Payer: Self-pay

## 2020-09-26 ENCOUNTER — Other Ambulatory Visit: Payer: Self-pay | Admitting: Obstetrics and Gynecology

## 2020-09-26 DIAGNOSIS — Z1231 Encounter for screening mammogram for malignant neoplasm of breast: Secondary | ICD-10-CM | POA: Diagnosis not present

## 2020-10-16 ENCOUNTER — Other Ambulatory Visit: Payer: Self-pay | Admitting: Family Medicine

## 2020-10-16 DIAGNOSIS — F419 Anxiety disorder, unspecified: Secondary | ICD-10-CM

## 2020-10-30 DIAGNOSIS — M25551 Pain in right hip: Secondary | ICD-10-CM | POA: Diagnosis not present

## 2020-10-30 DIAGNOSIS — S76011D Strain of muscle, fascia and tendon of right hip, subsequent encounter: Secondary | ICD-10-CM | POA: Diagnosis not present

## 2020-10-30 DIAGNOSIS — M6281 Muscle weakness (generalized): Secondary | ICD-10-CM | POA: Diagnosis not present

## 2020-11-11 ENCOUNTER — Other Ambulatory Visit: Payer: Self-pay | Admitting: Family Medicine

## 2020-11-11 DIAGNOSIS — F419 Anxiety disorder, unspecified: Secondary | ICD-10-CM

## 2020-11-12 ENCOUNTER — Telehealth: Payer: Self-pay

## 2020-11-12 NOTE — Telephone Encounter (Signed)
Called patient and informed her that the prescription was sent with 3 refills. Patient voiced understanding.

## 2020-11-12 NOTE — Telephone Encounter (Signed)
Patient would like to know if there is a way she could get more than one script at a time for the Wellbutrin XL?

## 2021-03-04 DIAGNOSIS — D225 Melanocytic nevi of trunk: Secondary | ICD-10-CM | POA: Diagnosis not present

## 2021-03-04 DIAGNOSIS — D2262 Melanocytic nevi of left upper limb, including shoulder: Secondary | ICD-10-CM | POA: Diagnosis not present

## 2021-03-04 DIAGNOSIS — L738 Other specified follicular disorders: Secondary | ICD-10-CM | POA: Diagnosis not present

## 2021-03-04 DIAGNOSIS — D2271 Melanocytic nevi of right lower limb, including hip: Secondary | ICD-10-CM | POA: Diagnosis not present

## 2021-03-18 ENCOUNTER — Telehealth: Payer: Self-pay | Admitting: *Deleted

## 2021-03-18 DIAGNOSIS — F419 Anxiety disorder, unspecified: Secondary | ICD-10-CM

## 2021-03-18 MED ORDER — BUPROPION HCL ER (XL) 150 MG PO TB24: ORAL_TABLET | ORAL | 2 refills | Status: DC

## 2021-03-18 NOTE — Telephone Encounter (Signed)
Patient called to get refill on buPROpion 150mg  24 hr tablet and a new physical scheduled. Medication refills sent in and appt scheduled for 04/24/2021.

## 2021-04-24 ENCOUNTER — Ambulatory Visit (INDEPENDENT_AMBULATORY_CARE_PROVIDER_SITE_OTHER): Payer: BC Managed Care – PPO | Admitting: Family Medicine

## 2021-04-24 ENCOUNTER — Encounter: Payer: Self-pay | Admitting: Family Medicine

## 2021-04-24 VITALS — BP 110/62 | HR 66 | Temp 98.4°F | Resp 16 | Ht 63.0 in | Wt 128.8 lb

## 2021-04-24 DIAGNOSIS — E042 Nontoxic multinodular goiter: Secondary | ICD-10-CM

## 2021-04-24 DIAGNOSIS — Z0000 Encounter for general adult medical examination without abnormal findings: Secondary | ICD-10-CM

## 2021-04-24 DIAGNOSIS — Z114 Encounter for screening for human immunodeficiency virus [HIV]: Secondary | ICD-10-CM | POA: Diagnosis not present

## 2021-04-24 DIAGNOSIS — Z1159 Encounter for screening for other viral diseases: Secondary | ICD-10-CM | POA: Diagnosis not present

## 2021-04-24 NOTE — Assessment & Plan Note (Signed)
Pt's PE WNL.  UTD on pap, mammo, colonoscopy, immunizations.  Check labs.  Anticipatory guidance provided.  

## 2021-04-24 NOTE — Patient Instructions (Signed)
Schedule a lab visit at your convenience Follow up in 1 year or as needed We'll notify you of your lab results and make any changes if needed Keep up the good work on healthy diet and regular exercise- you look great! Call with any questions or concerns Stay Safe!  Stay Healthy! Happy New Year!!!

## 2021-04-24 NOTE — Progress Notes (Signed)
° °  Subjective:    Patient ID: Margarette Canada, female    DOB: May 03, 1975, 46 y.o.   MRN: [id number]  HPI CPE- UTD on pap, mammo, colonoscopy.  'i'm feeling better than i've felt in awhile'  Health Maintenance  Topic Date Due   HIV Screening  Never done   Hepatitis C Screening  Never done   COVID-19 Vaccine (5 - Booster for Pfizer series) 03/05/2021   PAP SMEAR-Modifier  07/24/2022   TETANUS/TDAP  04/24/2030   COLONOSCOPY (Pts 45-31yrs Insurance coverage will need to be confirmed)  09/21/2030   INFLUENZA VACCINE  Completed   HPV VACCINES  Aged Out      Review of Systems Patient reports no vision/ hearing changes, adenopathy,fever, weight change,  persistant/recurrent hoarseness , swallowing issues, chest pain, palpitations, edema, persistant/recurrent cough, hemoptysis, dyspnea (rest/exertional/paroxysmal nocturnal), gastrointestinal bleeding (melena, rectal bleeding), abdominal pain, significant heartburn, bowel changes, GU symptoms (dysuria, hematuria, incontinence), Gyn symptoms (abnormal  bleeding, pain),  syncope, focal weakness, memory loss, numbness & tingling, skin/hair/nail changes, abnormal bruising or bleeding, anxiety, or depression.   This visit occurred during the SARS-CoV-2 public health emergency.  Safety protocols were in place, including screening questions prior to the visit, additional usage of staff PPE, and extensive cleaning of exam room while observing appropriate contact time as indicated for disinfecting solutions.      Objective:   Physical Exam General Appearance:    Alert, cooperative, no distress, appears stated age  Head:    Normocephalic, without obvious abnormality, atraumatic  Eyes:    PERRL, conjunctiva/corneas clear, EOM's intact, fundi    benign, both eyes  Ears:    Normal TM's and external ear canals, both ears  Nose:   Deferred due to COVID  Throat:   Neck:   Supple, symmetrical, trachea midline, no adenopathy;    Thyroid: no  enlargement/tenderness/nodules  Back:     Symmetric, no curvature, ROM normal, no CVA tenderness  Lungs:     Clear to auscultation bilaterally, respirations unlabored  Chest Wall:    No tenderness or deformity   Heart:    Regular rate and rhythm, S1 and S2 normal, no murmur, rub   or gallop  Breast Exam:    Deferred to mammo  Abdomen:     Soft, non-tender, bowel sounds active all four quadrants,    no masses, no organomegaly  Genitalia:    Deferred to GYN  Rectal:    Extremities:   Extremities normal, atraumatic, no cyanosis or edema  Pulses:   2+ and symmetric all extremities  Skin:   Skin color, texture, turgor normal, no rashes or lesions  Lymph nodes:   Cervical, supraclavicular, and axillary nodes normal  Neurologic:   CNII-XII intact, normal strength, sensation and reflexes    throughout          Assessment & Plan:

## 2021-04-24 NOTE — Assessment & Plan Note (Signed)
Pt w/ hx of this.  Check labs.  Will follow.

## 2021-04-29 ENCOUNTER — Other Ambulatory Visit (INDEPENDENT_AMBULATORY_CARE_PROVIDER_SITE_OTHER): Payer: BC Managed Care – PPO

## 2021-04-29 DIAGNOSIS — E042 Nontoxic multinodular goiter: Secondary | ICD-10-CM | POA: Diagnosis not present

## 2021-04-29 DIAGNOSIS — Z114 Encounter for screening for human immunodeficiency virus [HIV]: Secondary | ICD-10-CM

## 2021-04-29 DIAGNOSIS — Z0000 Encounter for general adult medical examination without abnormal findings: Secondary | ICD-10-CM

## 2021-04-29 DIAGNOSIS — Z1159 Encounter for screening for other viral diseases: Secondary | ICD-10-CM | POA: Diagnosis not present

## 2021-04-29 LAB — BASIC METABOLIC PANEL
BUN: 25 mg/dL — ABNORMAL HIGH (ref 6–23)
CO2: 30 mEq/L (ref 19–32)
Calcium: 8.9 mg/dL (ref 8.4–10.5)
Chloride: 104 mEq/L (ref 96–112)
Creatinine, Ser: 0.92 mg/dL (ref 0.40–1.20)
GFR: 74.49 mL/min (ref >60.00)
Glucose, Bld: 84 mg/dL (ref 70–99)
Potassium: 3.8 mEq/L (ref 3.5–5.1)
Sodium: 139 mEq/L (ref 135–145)

## 2021-04-29 LAB — CBC WITH DIFFERENTIAL/PLATELET
Basophils Absolute: 0 10*3/uL (ref 0.0–0.1)
Basophils Relative: 0.3 % (ref 0.0–3.0)
Eosinophils Absolute: 0 10*3/uL (ref 0.0–0.7)
Eosinophils Relative: 0.7 % (ref 0.0–5.0)
HCT: 37.3 % (ref 36.0–46.0)
Hemoglobin: 12.4 g/dL (ref 12.0–15.0)
Lymphocytes Relative: 33.2 % (ref 12.0–46.0)
Lymphs Abs: 1.9 10*3/uL (ref 0.7–4.0)
MCHC: 33.3 g/dL (ref 30.0–36.0)
MCV: 90.8 fl (ref 78.0–100.0)
Monocytes Absolute: 0.3 10*3/uL (ref 0.1–1.0)
Monocytes Relative: 5.7 % (ref 3.0–12.0)
Neutro Abs: 3.4 10*3/uL (ref 1.4–7.7)
Neutrophils Relative %: 60.1 % (ref 43.0–77.0)
Platelets: 141 10*3/uL — ABNORMAL LOW (ref 150.0–400.0)
RBC: 4.11 Mil/uL (ref 3.87–5.11)
RDW: 13.4 % (ref 11.5–15.5)
WBC: 5.6 10*3/uL (ref 4.0–10.5)

## 2021-04-29 LAB — LIPID PANEL
Cholesterol: 156 mg/dL (ref 0–200)
HDL: 65.9 mg/dL (ref >39.00)
LDL Cholesterol: 82 mg/dL (ref 0–99)
NonHDL: 89.83
Total CHOL/HDL Ratio: 2
Triglycerides: 38 mg/dL (ref 0.0–149.0)
VLDL: 7.6 mg/dL (ref 0.0–40.0)

## 2021-04-29 LAB — HEPATIC FUNCTION PANEL
ALT: 14 U/L (ref 0–35)
AST: 20 U/L (ref 0–37)
Albumin: 4 g/dL (ref 3.5–5.2)
Alkaline Phosphatase: 39 U/L (ref 39–117)
Bilirubin, Direct: 0.1 mg/dL (ref 0.0–0.3)
Total Bilirubin: 0.5 mg/dL (ref 0.2–1.2)
Total Protein: 6.2 g/dL (ref 6.0–8.3)

## 2021-04-30 DIAGNOSIS — M7918 Myalgia, other site: Secondary | ICD-10-CM | POA: Diagnosis not present

## 2021-04-30 DIAGNOSIS — M9904 Segmental and somatic dysfunction of sacral region: Secondary | ICD-10-CM | POA: Diagnosis not present

## 2021-04-30 DIAGNOSIS — M9905 Segmental and somatic dysfunction of pelvic region: Secondary | ICD-10-CM | POA: Diagnosis not present

## 2021-04-30 DIAGNOSIS — M9902 Segmental and somatic dysfunction of thoracic region: Secondary | ICD-10-CM | POA: Diagnosis not present

## 2021-04-30 DIAGNOSIS — M9903 Segmental and somatic dysfunction of lumbar region: Secondary | ICD-10-CM | POA: Diagnosis not present

## 2021-04-30 DIAGNOSIS — M25651 Stiffness of right hip, not elsewhere classified: Secondary | ICD-10-CM | POA: Diagnosis not present

## 2021-04-30 LAB — HEPATITIS C ANTIBODY
Hepatitis C Ab: NONREACTIVE
SIGNAL TO CUT-OFF: 0.03 (ref <1.00)

## 2021-04-30 LAB — HIV ANTIBODY (ROUTINE TESTING W REFLEX): HIV 1&2 Ab, 4th Generation: NONREACTIVE

## 2021-04-30 LAB — TSH: TSH: 1.99 u[IU]/mL (ref 0.35–5.50)

## 2021-05-02 ENCOUNTER — Telehealth: Payer: Self-pay

## 2021-05-02 NOTE — Telephone Encounter (Signed)
Patient aware of labs.  

## 2021-05-02 NOTE — Telephone Encounter (Signed)
-----   Message from Sheliah Hatch, MD sent at 05/01/2021  4:20 PM EST ----- Labs look great!  No changes at this time

## 2021-05-06 DIAGNOSIS — M9903 Segmental and somatic dysfunction of lumbar region: Secondary | ICD-10-CM | POA: Diagnosis not present

## 2021-05-06 DIAGNOSIS — M9904 Segmental and somatic dysfunction of sacral region: Secondary | ICD-10-CM | POA: Diagnosis not present

## 2021-05-06 DIAGNOSIS — M9905 Segmental and somatic dysfunction of pelvic region: Secondary | ICD-10-CM | POA: Diagnosis not present

## 2021-05-06 DIAGNOSIS — M9902 Segmental and somatic dysfunction of thoracic region: Secondary | ICD-10-CM | POA: Diagnosis not present

## 2021-06-08 ENCOUNTER — Other Ambulatory Visit: Payer: Self-pay | Admitting: Family Medicine

## 2021-06-08 DIAGNOSIS — F419 Anxiety disorder, unspecified: Secondary | ICD-10-CM

## 2021-07-30 DIAGNOSIS — B3731 Acute candidiasis of vulva and vagina: Secondary | ICD-10-CM | POA: Diagnosis not present

## 2021-07-30 DIAGNOSIS — Z01419 Encounter for gynecological examination (general) (routine) without abnormal findings: Secondary | ICD-10-CM | POA: Diagnosis not present

## 2021-07-30 DIAGNOSIS — Z6824 Body mass index (BMI) 24.0-24.9, adult: Secondary | ICD-10-CM | POA: Diagnosis not present

## 2021-07-30 DIAGNOSIS — Z9189 Other specified personal risk factors, not elsewhere classified: Secondary | ICD-10-CM | POA: Insufficient documentation

## 2021-08-14 DIAGNOSIS — L820 Inflamed seborrheic keratosis: Secondary | ICD-10-CM | POA: Diagnosis not present

## 2021-08-14 DIAGNOSIS — L738 Other specified follicular disorders: Secondary | ICD-10-CM | POA: Diagnosis not present

## 2021-08-14 DIAGNOSIS — L245 Irritant contact dermatitis due to other chemical products: Secondary | ICD-10-CM | POA: Diagnosis not present

## 2021-08-15 IMAGING — MG DIGITAL SCREENING BREAST BILAT IMPLANT W/ TOMO W/ CAD
9 of 12 series · 9 of 28 positions shown · non-contrast
Comparison: Previous exam(s).

CLINICAL DATA: Screening.

EXAM:
DIGITAL SCREENING BILATERAL MAMMOGRAM WITH IMPLANTS, CAD AND
TOMOSYNTHESIS
TECHNIQUE: Bilateral screening digital craniocaudal and mediolateral oblique
mammograms were obtained. Bilateral screening digital breast
tomosynthesis was performed. The images were evaluated with
computer-aided detection. Standard and/or implant displaced views
were performed.

[R MLO]
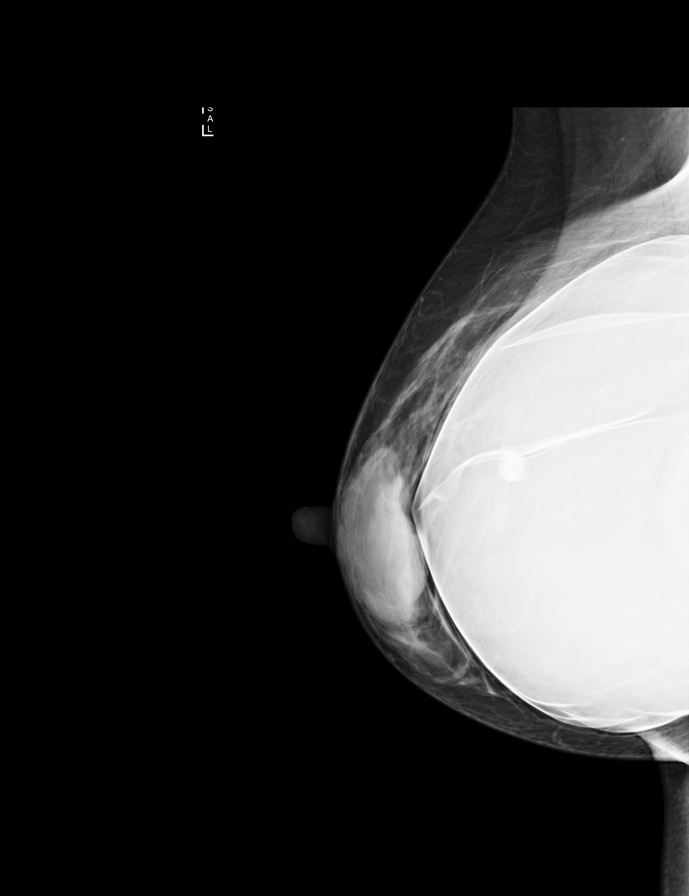

[R CC]
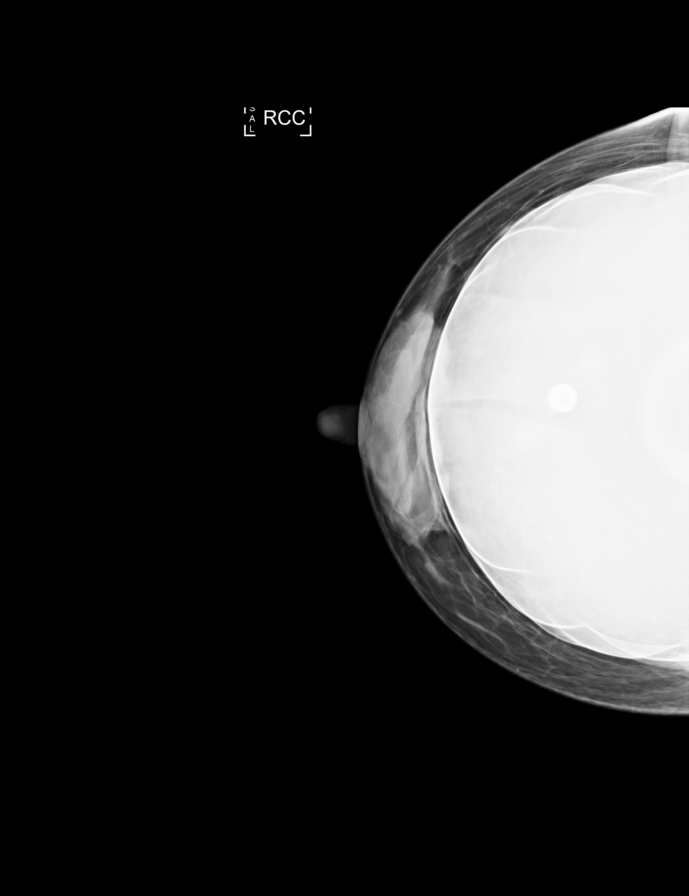

[L CC]
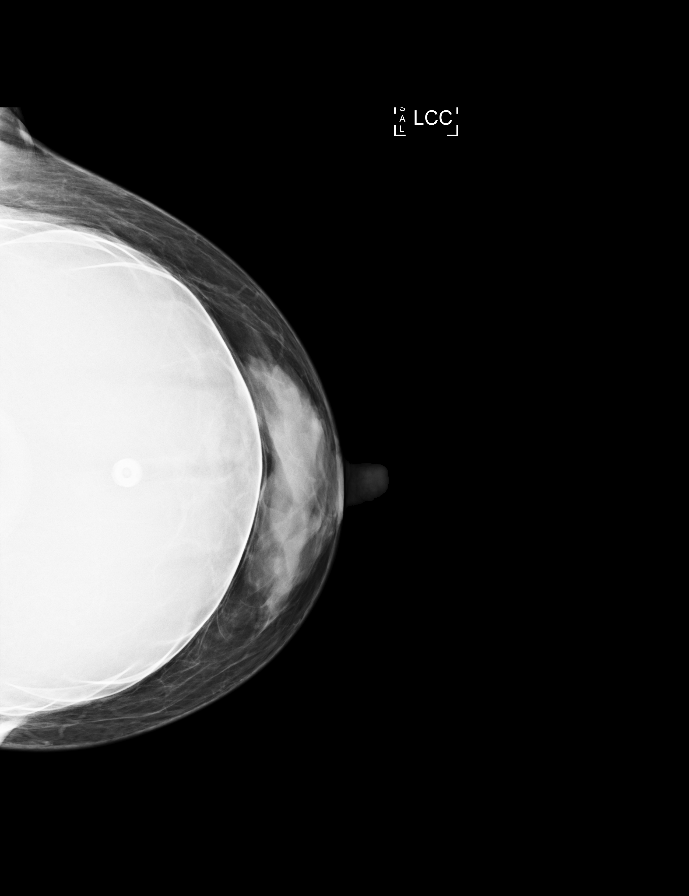

[L MLO]
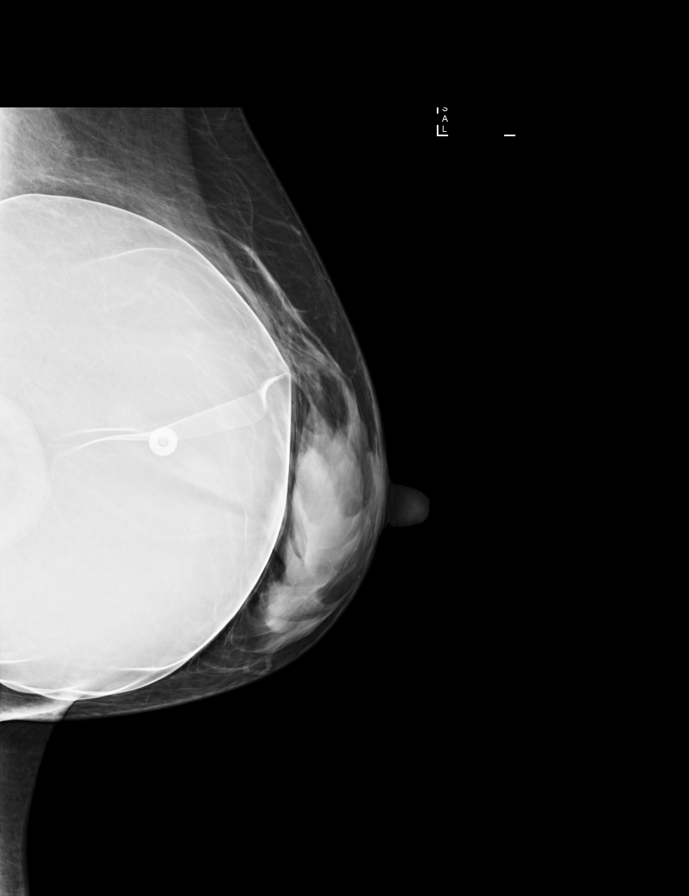

[R CC synth-2D]
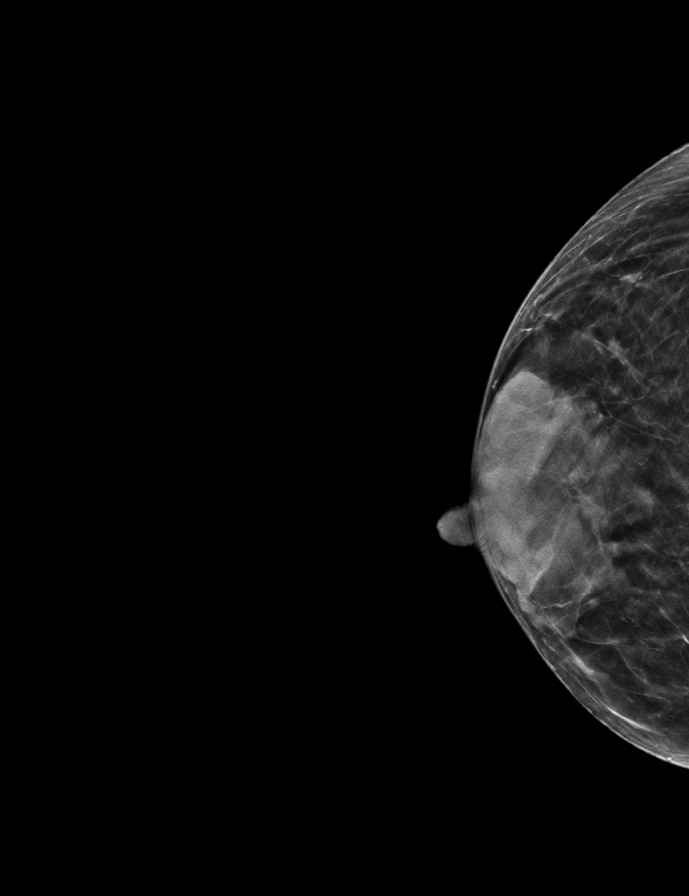

[L CC synth-2D]
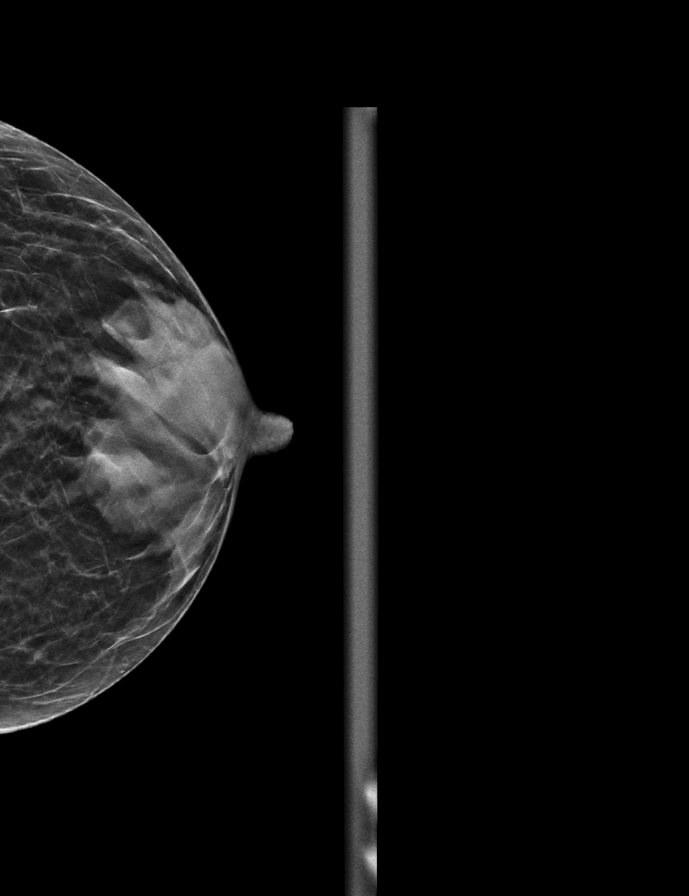

[R MLO synth-2D]
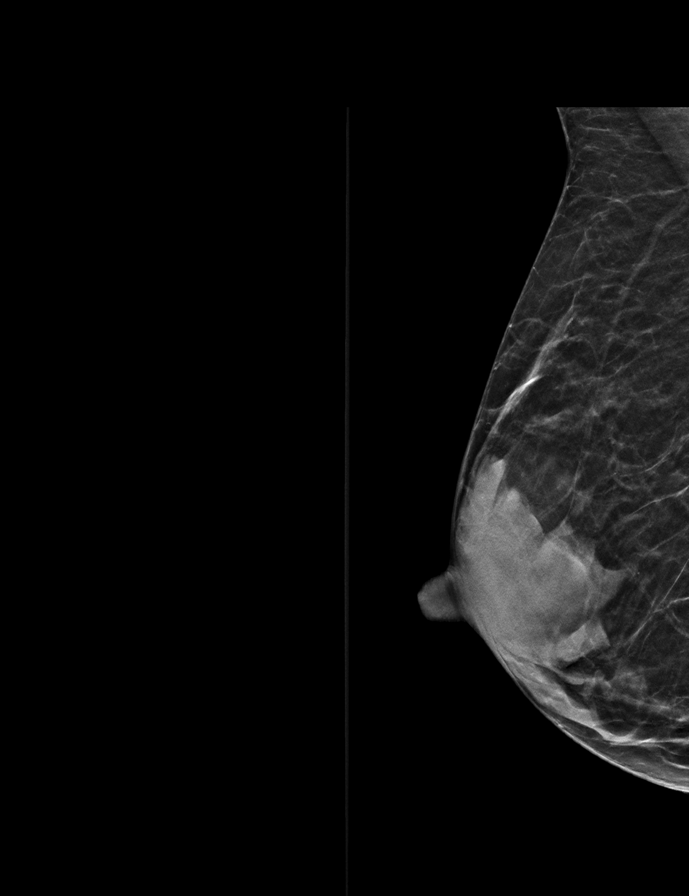

[L MLO synth-2D]
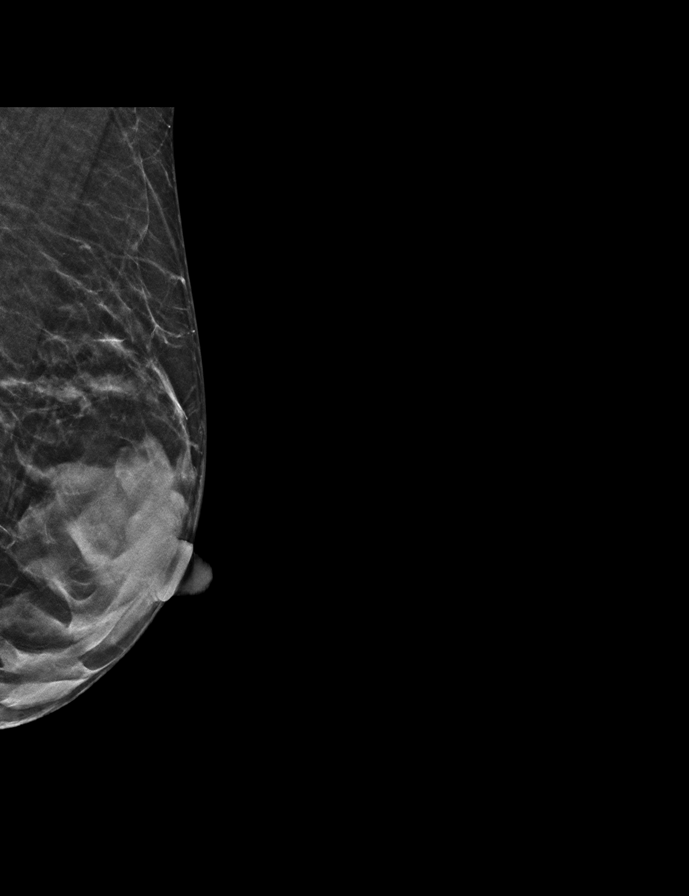

[R MLOID BREAST TOMOSYNTHESIS IMAGE tomo · tomo slice 17/32.0]
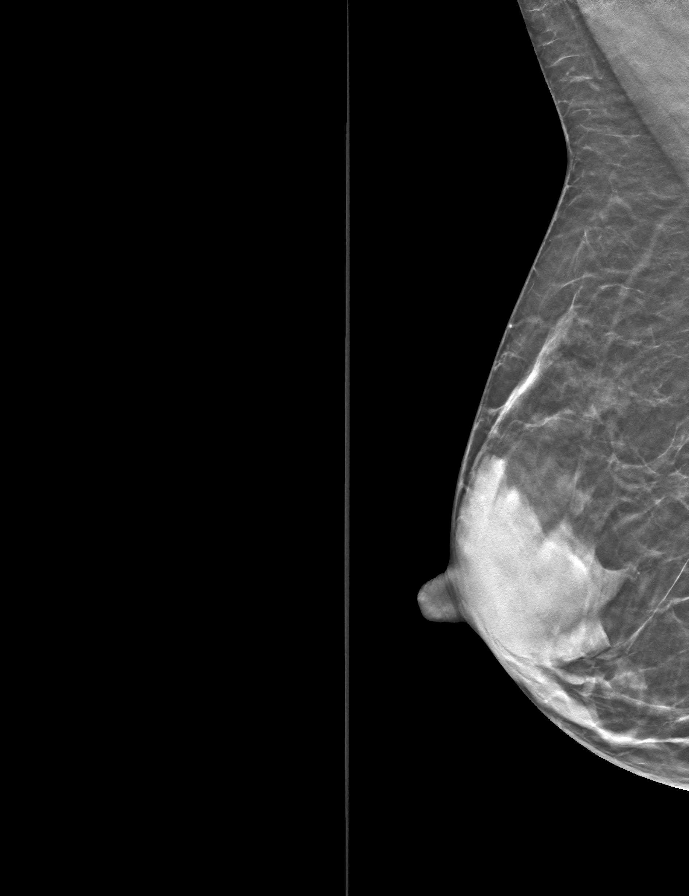

[9 of 28 positions shown; findings below may reference images not displayed]

ACR Breast Density Category d: The breast tissue is extremely dense,
which lowers the sensitivity of mammography.
FINDINGS: The patient has retropectoral implants. There are no findings
suspicious for malignancy.
IMPRESSION: No mammographic evidence of malignancy. A result letter of this
screening mammogram will be mailed directly to the patient.

RECOMMENDATION:
Screening mammogram in one year. (Code:GG-[id number])

BI-RADS CATEGORY  1:  Negative.

## 2021-08-28 ENCOUNTER — Other Ambulatory Visit: Payer: Self-pay

## 2021-08-28 ENCOUNTER — Telehealth: Payer: Self-pay | Admitting: Family Medicine

## 2021-08-28 ENCOUNTER — Other Ambulatory Visit: Payer: Self-pay | Admitting: Obstetrics and Gynecology

## 2021-08-28 DIAGNOSIS — F419 Anxiety disorder, unspecified: Secondary | ICD-10-CM

## 2021-08-28 DIAGNOSIS — Z1231 Encounter for screening mammogram for malignant neoplasm of breast: Secondary | ICD-10-CM

## 2021-08-28 MED ORDER — BUPROPION HCL ER (XL) 150 MG PO TB24: ORAL_TABLET | ORAL | 2 refills | Status: DC

## 2021-08-28 NOTE — Telephone Encounter (Signed)
Pt called in asking for a refill on the [employer]prop[employer]   Pt uses [facility] on [address]

## 2021-10-06 ENCOUNTER — Ambulatory Visit
Admission: RE | Admit: 2021-10-06 | Discharge: 2021-10-06 | Disposition: A | Discharge status: Home / Self Care | Payer: BC Managed Care – PPO | Source: Ambulatory Visit | Attending: Obstetrics and Gynecology | Admitting: Obstetrics and Gynecology

## 2021-10-06 DIAGNOSIS — Z1231 Encounter for screening mammogram for malignant neoplasm of breast: Secondary | ICD-10-CM | POA: Diagnosis not present

## 2021-10-07 DIAGNOSIS — L245 Irritant contact dermatitis due to other chemical products: Secondary | ICD-10-CM | POA: Diagnosis not present

## 2021-10-07 DIAGNOSIS — L738 Other specified follicular disorders: Secondary | ICD-10-CM | POA: Diagnosis not present

## 2021-10-20 DIAGNOSIS — L710 Perioral dermatitis: Secondary | ICD-10-CM | POA: Diagnosis not present

## 2021-12-02 ENCOUNTER — Telehealth: Payer: Self-pay | Admitting: Family Medicine

## 2021-12-02 ENCOUNTER — Other Ambulatory Visit: Payer: Self-pay

## 2021-12-02 DIAGNOSIS — F419 Anxiety disorder, unspecified: Secondary | ICD-10-CM

## 2021-12-02 MED ORDER — BUPROPION HCL ER (XL) 150 MG PO TB24: ORAL_TABLET | ORAL | 6 refills | Status: DC

## 2021-12-03 NOTE — Telephone Encounter (Signed)
error 

## 2022-04-22 ENCOUNTER — Telehealth: Payer: Self-pay | Admitting: Family Medicine

## 2022-04-22 NOTE — Telephone Encounter (Signed)
I spoke to the pt and advised  her of Dr Birdie Riddle message

## 2022-04-22 NOTE — Telephone Encounter (Signed)
I understand her concerns, and we can definitely discuss them at her visit.  But checking hormone levels at this point won't be effective/revealing bc she is still having cycles.  It sounds like she's in peri-menopause and we typically treat the symptoms rather than look at the hormones (b/c we know what the issue is)

## 2022-04-22 NOTE — Telephone Encounter (Signed)
Is this ok with you ? And what labs would you like to order ?

## 2022-04-22 NOTE — Telephone Encounter (Signed)
Pt has an apt or annual on 05/28/22 per pt and she is asking if we can give anything for the headaches

## 2022-04-22 NOTE — Telephone Encounter (Signed)
Spoke to the pt and she advised that [facility] and they do not take her insurance at this time . She states she gets terrible headaches right before her cycle, moodiness , night sweats awful before her cycle , cycles have changed . This is the reasoning for wanting her hormone levels checked

## 2022-04-22 NOTE — Telephone Encounter (Signed)
I need to know what symptoms she's experiencing or what concerns she has in regards to hormone testing (and if there are any specific hormones she wants done- we typically do FSH and LH to assess for menopause) b/c we need a dx to associate with each lab that is ordered

## 2022-04-22 NOTE — Telephone Encounter (Signed)
Caller name: Kristina King  On DPR?: Yes  Call back number: [phone] (mobile)  Provider they see: Midge Minium, MD  Reason for call:  Patient has a physical on 05/28/22. Patient wants to come in do her labs two days before her physical. Patient also want to add hormone level to her labs

## 2022-04-23 NOTE — Telephone Encounter (Signed)
Left pt a vm to return my call.

## 2022-04-23 NOTE — Telephone Encounter (Signed)
  Pt states she has tried Imitrex in the past and it did not help her with the headaches . I have scheduled her a my chart visit for Tuesday Jan 30,2024 with Dr Birdie Riddle as she suggested

## 2022-04-23 NOTE — Telephone Encounter (Signed)
I need more information about her headaches and what she has tried in order to treat appropriately.  It would be best for her to schedule a video visit (or in office if she prefers) so we can discuss and treat.  I certainly don't want her waiting a month.

## 2022-04-27 ENCOUNTER — Encounter: Payer: BC Managed Care – PPO | Admitting: Family Medicine

## 2022-04-28 ENCOUNTER — Telehealth (INDEPENDENT_AMBULATORY_CARE_PROVIDER_SITE_OTHER): Payer: Managed Care, Other (non HMO) | Admitting: Family Medicine

## 2022-04-28 ENCOUNTER — Encounter: Payer: Self-pay | Admitting: Family Medicine

## 2022-04-28 VITALS — Wt 136.0 lb

## 2022-04-28 DIAGNOSIS — G43829 Menstrual migraine, not intractable, without status migrainosus: Secondary | ICD-10-CM

## 2022-04-28 MED ORDER — RIZATRIPTAN BENZOATE 10 MG PO TABS: 10.0000 mg | ORAL_TABLET | ORAL | 6 refills | Status: DC | PRN

## 2022-04-28 NOTE — Progress Notes (Signed)
Virtual Visit via Video   I connected with patient on 04/28/22 at  8:20 AM EST by a video enabled telemedicine application and verified that I am speaking with the correct person using two identifiers.  Location patient: Home Location provider: [facility], Office Persons participating in the virtual visit: Patient, Provider, [place] Marcille Blanco C)  I discussed the limitations of evaluation and management by telemedicine and the availability of in person appointments. The patient expressed understanding and agreed to proceed.  Subjective:   HPI:   HA- pt reports she is currently perimenopausal.  Still having cycles 'but they're significantly shorter'.  Has had worsening HA's over the last 3 months.  Was treated over the years w/ OCP's.  Has a hx of 'full blown migraines'.  We are now back to 'migraine level headaches'.  Has a HA at time of ovulation and 4-5 days prior to onset of menses.  HA resolves once menses starts.  Pt is having a hard time treating her HA's b/c she does intermittent fasting and finds that she needs to eat in order to take meds (Excedrin is her preference).  Pt reports taking Imitrex 'years ago' but did not tolerate.  Thinks it made her light headed.  Pt will occasionally have nausea w/ headaches.  Pt reports she had her first migraine w/ aura a few weeks ago.  ROS:   See pertinent positives and negatives per HPI.  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    Social History   Tobacco Use   Smoking status: Former    Types: Cigarettes    Quit date: 04/14/1993    Years since quitting: 29.0   Smokeless tobacco: Never  Substance Use Topics   Alcohol use: Yes    Alcohol/week: 1.0 - 2.0 standard drink of alcohol    Types: 1 - 2 Standard drinks or equivalent  per week    Comment: month    Current Outpatient Medications:    Ashwagandha 500 MG CAPS, Take 2 capsules by mouth daily., Disp: , Rfl:    Barberry-Oreg Grape-Goldenseal (BERBERINE COMPLEX) 200-200-50 MG CAPS, Take by mouth., Disp: , Rfl:    buPROPion (WELLBUTRIN XL) 150 MG 24 hr tablet, TAKE 1 TABLET(150 MG) BY MOUTH DAILY, Disp: 30 tablet, Rfl: 6   Pyridoxine HCl (VITAMIN B-6 PO), Take by mouth., Disp: , Rfl:    Vitamin D, Cholecalciferol, 50 MCG (2000 UT) CAPS, Take 2 capsules by mouth daily., Disp: , Rfl:    Lactobacillus-Inulin (CULTURELLE DIGESTIVE DAILY PO), Take by mouth. (Patient not taking: Reported on 04/28/2022), Disp: , Rfl:   No Known Allergies  Objective:   Wt 136 lb (61.7 kg)   BMI 24.09 kg/m  AAOx3, NAD NCAT, EOMI No obvious CN deficits Coloring WNL Pt is able to speak clearly, coherently without shortness of breath or increased work of breathing.  Thought process is linear.  Mood is appropriate.   Assessment and Plan:   Menstrual migraine- new to provider, ongoing for pt.  Now worse w/ perimenopause.  Not having relief w/ OTC pain relievers.  Was previously on Imitrex years ago but did not tolerate it due to feeling light headed.  Given that current migraines are predictable- ovulation and then 4-5 days prior to menses- will try Maxalt as a rescue rather  than a daily controller medication.  Reviewed supportive care and red flags that should prompt return.  Pt expressed understanding and is in agreement w/ plan.    Annye Asa, MD 04/28/2022

## 2022-05-25 ENCOUNTER — Telehealth: Payer: Self-pay | Admitting: Family Medicine

## 2022-05-25 NOTE — Telephone Encounter (Signed)
I spoke to the pt and advised that she would like to have the results at her visit on the 29th so she is asking for them to de be done on the 28th .  She states she has nothing possibly to have any additional labs done at  visit .

## 2022-05-25 NOTE — Telephone Encounter (Signed)
Caller name: CHIANTI PETSKA  On DPR?: Yes  Call back number: [phone] (mobile)  Provider they see: Midge Minium, MD  Reason for call:Pt would like to know can she get blood work first in the morning before her physical appt at 1:20 05/28/22

## 2022-05-28 ENCOUNTER — Encounter: Payer: Self-pay | Admitting: Family Medicine

## 2022-05-28 ENCOUNTER — Ambulatory Visit (INDEPENDENT_AMBULATORY_CARE_PROVIDER_SITE_OTHER): Payer: Managed Care, Other (non HMO) | Admitting: Family Medicine

## 2022-05-28 VITALS — BP 116/80 | HR 69 | Temp 98.9°F | Resp 16 | Ht 63.0 in | Wt 137.5 lb

## 2022-05-28 DIAGNOSIS — Z0000 Encounter for general adult medical examination without abnormal findings: Secondary | ICD-10-CM

## 2022-05-28 DIAGNOSIS — E042 Nontoxic multinodular goiter: Secondary | ICD-10-CM | POA: Diagnosis not present

## 2022-05-28 DIAGNOSIS — G43829 Menstrual migraine, not intractable, without status migrainosus: Secondary | ICD-10-CM | POA: Diagnosis not present

## 2022-05-28 LAB — LIPID PANEL
Cholesterol: 196 mg/dL (ref 0–200)
HDL: 77.8 mg/dL (ref >39.00)
LDL Cholesterol: 110 mg/dL — ABNORMAL HIGH (ref 0–99)
NonHDL: 118.46
Total CHOL/HDL Ratio: 3
Triglycerides: 44 mg/dL (ref 0.0–149.0)
VLDL: 8.8 mg/dL (ref 0.0–40.0)

## 2022-05-28 LAB — CBC WITH DIFFERENTIAL/PLATELET
Basophils Absolute: 0 10*3/uL (ref 0.0–0.1)
Basophils Relative: 0.3 % (ref 0.0–3.0)
Eosinophils Absolute: 0 10*3/uL (ref 0.0–0.7)
Eosinophils Relative: 0.5 % (ref 0.0–5.0)
HCT: 40.5 % (ref 36.0–46.0)
Hemoglobin: 13.7 g/dL (ref 12.0–15.0)
Lymphocytes Relative: 32.5 % (ref 12.0–46.0)
Lymphs Abs: 2.2 10*3/uL (ref 0.7–4.0)
MCHC: 33.8 g/dL (ref 30.0–36.0)
MCV: 90.1 fl (ref 78.0–100.0)
Monocytes Absolute: 0.4 10*3/uL (ref 0.1–1.0)
Monocytes Relative: 6.2 % (ref 3.0–12.0)
Neutro Abs: 4 10*3/uL (ref 1.4–7.7)
Neutrophils Relative %: 60.5 % (ref 43.0–77.0)
Platelets: 201 10*3/uL (ref 150.0–400.0)
RBC: 4.5 Mil/uL (ref 3.87–5.11)
RDW: 13.5 % (ref 11.5–15.5)
WBC: 6.7 10*3/uL (ref 4.0–10.5)

## 2022-05-28 LAB — HEPATIC FUNCTION PANEL
ALT: 15 U/L (ref 0–35)
AST: 23 U/L (ref 0–37)
Albumin: 4.2 g/dL (ref 3.5–5.2)
Alkaline Phosphatase: 42 U/L (ref 39–117)
Bilirubin, Direct: 0.1 mg/dL (ref 0.0–0.3)
Total Bilirubin: 0.7 mg/dL (ref 0.2–1.2)
Total Protein: 7.1 g/dL (ref 6.0–8.3)

## 2022-05-28 LAB — BASIC METABOLIC PANEL
BUN: 13 mg/dL (ref 6–23)
CO2: 27 mEq/L (ref 19–32)
Calcium: 9.7 mg/dL (ref 8.4–10.5)
Chloride: 102 mEq/L (ref 96–112)
Creatinine, Ser: 0.94 mg/dL (ref 0.40–1.20)
GFR: 72.04 mL/min (ref >60.00)
Glucose, Bld: 83 mg/dL (ref 70–99)
Potassium: 3.9 mEq/L (ref 3.5–5.1)
Sodium: 137 mEq/L (ref 135–145)

## 2022-05-28 LAB — TSH: TSH: 1.75 u[IU]/mL (ref 0.35–5.50)

## 2022-05-28 MED ORDER — UBRELVY 50 MG PO TABS: 50.0000 mg | ORAL_TABLET | Freq: Once | ORAL | 3 refills | Status: DC

## 2022-05-28 NOTE — Progress Notes (Signed)
   Subjective:    Patient ID: Margarette Canada, female    DOB: 04/27/1975, 47 y.o.   MRN: [id number]  HPI CPE- UTD on pap, Tdap, colonoscopy, mammo  Patient Care Team    Relationship Specialty Notifications Start End  Midge Minium, MD PCP - General Family Medicine  07/18/19     Health Maintenance  Topic Date Due   PAP SMEAR-Modifier  07/24/2022   DTaP/Tdap/Td (2 - Td or Tdap) 04/24/2030   COLONOSCOPY (Pts 45-34yr Insurance coverage will need to be confirmed)  09/21/2030   INFLUENZA VACCINE  Completed   Hepatitis C Screening  Completed   HIV Screening  Completed   HPV VACCINES  Aged Out   COVID-19 Vaccine  Discontinued      Review of Systems Patient reports no vision/ hearing changes, adenopathy,fever, weight change,  persistant/recurrent hoarseness , swallowing issues, chest pain, palpitations, edema, persistant/recurrent cough, hemoptysis, dyspnea (rest/exertional/paroxysmal nocturnal), gastrointestinal bleeding (melena, rectal bleeding), abdominal pain, significant heartburn, bowel changes, GU symptoms (dysuria, hematuria, incontinence), Gyn symptoms (abnormal  bleeding, pain),  syncope, focal weakness, memory loss, numbness & tingling, skin/hair/nail changes, abnormal bruising or bleeding, anxiety, or depression.     Objective:   Physical Exam General Appearance:    Alert, cooperative, no distress, appears stated age  Head:    Normocephalic, without obvious abnormality, atraumatic  Eyes:    PERRL, conjunctiva/corneas clear, EOM's intact both eyes  Ears:    Normal TM's and external ear canals, both ears  Nose:   Nares normal, septum midline, mucosa normal, no drainage    or sinus tenderness  Throat:   Lips, mucosa, and tongue normal; teeth and gums normal  Neck:   Supple, symmetrical, trachea midline, no adenopathy;    Thyroid: no enlargement/tenderness/nodules  Back:     Symmetric, no curvature, ROM normal, no CVA tenderness  Lungs:     Clear to auscultation  bilaterally, respirations unlabored  Chest Wall:    No tenderness or deformity   Heart:    Regular rate and rhythm, S1 and S2 normal, no murmur, rub   or gallop  Breast Exam:    Deferred to GYN  Abdomen:     Soft, non-tender, bowel sounds active all four quadrants,    no masses, no organomegaly  Genitalia:    Deferred to GYN  Rectal:    Extremities:   Extremities normal, atraumatic, no cyanosis or edema  Pulses:   2+ and symmetric all extremities  Skin:   Skin color, texture, turgor normal, no rashes or lesions  Lymph nodes:   Cervical, supraclavicular, and axillary nodes normal  Neurologic:   CNII-XII intact, normal strength, sensation and reflexes    throughout          Assessment & Plan:

## 2022-05-28 NOTE — Assessment & Plan Note (Signed)
Pt's PE WNL.  UTD on pap, mammo, colonoscopy, Tdap.  Check labs.  Anticipatory guidance provided.

## 2022-05-28 NOTE — Patient Instructions (Signed)
Follow up in 1 year or as needed We'll notify you of your lab results and make any changes if needed Keep up the good work on healthy diet and regular exercise- you look great! Go to [email or web address] and sign up for the savings card Take the Ubrelvy as needed for migraines Call with any questions or concerns Stay Safe!  Stay Healthy!!

## 2022-05-28 NOTE — Assessment & Plan Note (Signed)
Check labs.  Tx w/ meds if needed

## 2022-05-29 ENCOUNTER — Telehealth: Payer: Self-pay

## 2022-05-29 NOTE — Telephone Encounter (Signed)
Left lab results on pt VM

## 2022-05-29 NOTE — Telephone Encounter (Signed)
-----   Message from Midge Minium, MD sent at 05/29/2022  7:33 AM EST ----- Labs look great!  No changes at this time

## 2022-06-08 ENCOUNTER — Other Ambulatory Visit: Payer: Self-pay | Admitting: Family Medicine

## 2022-06-08 NOTE — Telephone Encounter (Signed)
Caller name: ANAIA ROBICHEAU  On DPR?: Yes  Call back number: [phone] (mobile)  Provider they see: Midge Minium, MD  Reason for call: Patient called to get a update on her PA for Ubrogepant (UBRELVY) 50 MG TABS

## 2022-06-08 NOTE — Telephone Encounter (Signed)
Do you guys by any chance have this PA ? I have not seen it come thru

## 2022-06-09 MED ORDER — UBRELVY 50 MG PO TABS: 50.0000 mg | ORAL_TABLET | Freq: Once | ORAL | 3 refills | Status: AC

## 2022-06-09 NOTE — Telephone Encounter (Signed)
Submitted via CMM for determination

## 2022-06-09 NOTE — Telephone Encounter (Signed)
Request kicked back and stated the request is inactive it appears the patient received maxalt on 04/22/22 and no refill ubreveley if appropriate patient will need a new Rx for [place] to do PA   Please advise is patient supposed to take both? And if so can you sign off on refill

## 2022-06-09 NOTE — Telephone Encounter (Signed)
Do you want the pt on Both Maxalt and Ubrevely . ? If they Kristina King is the chosen medication I placed the PA form in Dr Birdie Riddle to be signed folder

## 2022-06-09 NOTE — Addendum Note (Signed)
Addended by: Patrcia Dolly on: 06/09/2022 09:59 AM   Modules accepted: Orders

## 2022-06-10 NOTE — Telephone Encounter (Signed)
Maxalt was not working.  We need to try and get Kristina King approved

## 2022-06-12 NOTE — Telephone Encounter (Signed)
Left pt a VM that her Rx [name] has been approved from 06/10/22-06/11/23.

## 2022-06-18 ENCOUNTER — Other Ambulatory Visit (HOSPITAL_COMMUNITY): Payer: Self-pay

## 2022-06-18 NOTE — Telephone Encounter (Signed)
Per previous encounter, [name] has been approved. Ran test claim and received paid claim.

## 2022-06-23 ENCOUNTER — Other Ambulatory Visit: Payer: Self-pay

## 2022-06-23 DIAGNOSIS — F419 Anxiety disorder, unspecified: Secondary | ICD-10-CM

## 2022-06-23 MED ORDER — BUPROPION HCL ER (XL) 150 MG PO TB24: ORAL_TABLET | ORAL | 6 refills | Status: DC

## 2022-07-06 ENCOUNTER — Other Ambulatory Visit (HOSPITAL_COMMUNITY): Payer: Self-pay

## 2022-07-07 ENCOUNTER — Ambulatory Visit
Admission: RE | Admit: 2022-07-07 | Discharge: 2022-07-07 | Disposition: A | Discharge status: Home / Self Care | Payer: Managed Care, Other (non HMO) | Source: Ambulatory Visit | Attending: Sports Medicine | Admitting: Sports Medicine

## 2022-07-07 ENCOUNTER — Ambulatory Visit (INDEPENDENT_AMBULATORY_CARE_PROVIDER_SITE_OTHER): Payer: Managed Care, Other (non HMO) | Admitting: Sports Medicine

## 2022-07-07 VITALS — BP 110/82 | Ht 64.5 in | Wt 133.0 lb

## 2022-07-07 DIAGNOSIS — M25551 Pain in right hip: Secondary | ICD-10-CM

## 2022-07-07 MED ORDER — MELOXICAM 15 MG PO TABS: ORAL_TABLET | ORAL | 1 refills | Status: DC

## 2022-07-07 NOTE — Progress Notes (Unsigned)
PCP: Sheliah Hatch, MD  Subjective:   HPI: Patient is a 47 y.o. female here for right hip pain that started a little over a year ago. Unsure of where the pain originates but it seems to be in her right hip and radiating down to her gluteal muscle and sometimes into her upper medial thigh. She does pilates 5x a week. She feels that this is affecting her core strength. Describes pain as a deep bone pain, denies neither sharp or dull, aching pain. Feels like a throbbing sensation that is present most of the time. Aggravating factors include certain positions and movements including participating in her pilates classes. Over the past weekend, she did not experience any pain and felt good but later that night went to a restaurant where she sat at a barstool and it started to ache again. Denies numbness or tingling. She cannot seem to find any relieving factors as nothing seems to improve the pain. Gets pain during the night. Has tried tylenol and ibuprofen which used to provide relief and now which has progressively worsened over the past few months.     Past Medical History:  Diagnosis Date   Allergy    Anxiety    Benign thyroid cyst    aspirated   Depression    Pt denies depression   GERD (gastroesophageal reflux disease)    Headache    History of chicken pox     Current Outpatient Medications on File Prior to Visit  Medication Sig Dispense Refill   Ashwagandha 500 MG CAPS Take 2 capsules by mouth daily.     Barberry-Oreg Grape-Goldenseal (BERBERINE COMPLEX) 200-200-50 MG CAPS Take by mouth.     buPROPion (WELLBUTRIN XL) 150 MG 24 hr tablet TAKE 1 TABLET(150 MG) BY MOUTH DAILY 30 tablet 6   Lactobacillus-Inulin (CULTURELLE DIGESTIVE DAILY PO) Take by mouth.     Pyridoxine HCl (VITAMIN B-6 PO) Take by mouth.     rizatriptan (MAXALT) 10 MG tablet Take 1 tablet (10 mg total) by mouth as needed for migraine. May repeat in 2 hours if needed 10 tablet 6   Vitamin D, Cholecalciferol, 50 MCG  (2000 UT) CAPS Take 2 capsules by mouth daily.     No current facility-administered medications on file prior to visit.    Past Surgical History:  Procedure Laterality Date   AUGMENTATION MAMMAPLASTY Bilateral    BREAST SURGERY     augmentation   ELBOW SURGERY     HARDWARE REMOVAL Right 02/01/2015   Procedure: RIGHT ELBOW HARDWARE REMOVAL;  Surgeon: Teryl Lucy, MD;  Location: [facility];  Service: Orthopedics;  Laterality: Right;   TONSILLECTOMY     TONSILLECTOMY     WRIST ARTHROSCOPY      No Known Allergies  BP 110/82   Ht 5' 4.5" (1.638 m)   Wt 133 lb (60.3 kg)   BMI 22.48 kg/m       No data to display              No data to display              Objective:  Physical Exam:  Gen: NAD, comfortable in exam room MSK: Right hip joint Inspection: no gross deformity, no associated erythema or edema noted Palpation: no point tenderness noted upon palpation ROM: full active ROM with pain elicited more prominently with abduction and external rotation without crepitus  Strength: 5/5 LE strength bilaterally  Special testing: positive FADIR testing, negative FABER, negative normal  Trendelenburg straight leg raise bilaterally Neuro: gross sensation intact, normal gait    Assessment & Plan:  1. Right hip pain: unclear as to exact etiology although seems most consistent with hip joint dysfunction. Differentials considered include sciatica, SI joint dysfunction, piriformis syndrome but these seem less likely given exam. Also does not seem consistent with lumbar radiculopathy. Patient maintains full strength and remains very active, it is concerning since at this point her symptoms are affecting her quality of life. Will obtain pelvic x-ray to evaluate hip joint, possibly more concerning for osteoarthritic or degenerative changes. Can also rule out psoas etiology or labral tear. Prescribed meloxicam for temporary relief, may consider MRI if pain does not  improve and x-ray noncontributory.

## 2022-07-21 ENCOUNTER — Other Ambulatory Visit: Payer: Self-pay

## 2022-07-21 DIAGNOSIS — M25551 Pain in right hip: Secondary | ICD-10-CM

## 2022-07-29 ENCOUNTER — Ambulatory Visit (INDEPENDENT_AMBULATORY_CARE_PROVIDER_SITE_OTHER): Payer: Managed Care, Other (non HMO) | Admitting: Family Medicine

## 2022-07-29 VITALS — BP 104/68 | Ht 64.5 in | Wt 132.0 lb

## 2022-07-29 DIAGNOSIS — M25551 Pain in right hip: Secondary | ICD-10-CM

## 2022-07-29 DIAGNOSIS — M5416 Radiculopathy, lumbar region: Secondary | ICD-10-CM

## 2022-07-29 MED ORDER — PREDNISONE 10 MG PO TABS: ORAL_TABLET | ORAL | 0 refills | Status: DC

## 2022-07-29 NOTE — Progress Notes (Signed)
PCP: Sheliah Hatch, MD  Subjective:   HPI: Patient is a 47 y.o. female here for right hip pain.  Patient came in today to primarily go over MRI results of her right hip and to consider intraarticular injection. Her MRI is essentially normal - no evidence arthropathy or labral tear.  She has some mild gluteal tendinopathy but nothing to explain her right hip pain. She reports this started about a year ago and has been worsening since then. Was posterior then since has been wrapping around anteriorly into groin. Nothing seems to make it better or worse so she's just been pushing through with her normal activities including tennis. No bowel/bladder dysfunction. No numbness/tingling. No back pain. Active with pilates, strengthening of core. Meloxicam and otc medications have not helped.  Past Medical History:  Diagnosis Date   Allergy    Anxiety    Benign thyroid cyst    aspirated   Depression    Pt denies depression   GERD (gastroesophageal reflux disease)    Headache    History of chicken pox     Current Outpatient Medications on File Prior to Visit  Medication Sig Dispense Refill   Ashwagandha 500 MG CAPS Take 2 capsules by mouth daily.     Barberry-Oreg Grape-Goldenseal (BERBERINE COMPLEX) 200-200-50 MG CAPS Take by mouth.     buPROPion (WELLBUTRIN XL) 150 MG 24 hr tablet TAKE 1 TABLET(150 MG) BY MOUTH DAILY 30 tablet 6   Lactobacillus-Inulin (CULTURELLE DIGESTIVE DAILY PO) Take by mouth.     meloxicam (MOBIC) 15 MG tablet Take 1 tablet daily with food for 5 days. Then take as needed. 40 tablet 1   Pyridoxine HCl (VITAMIN B-6 PO) Take by mouth.     rizatriptan (MAXALT) 10 MG tablet Take 1 tablet (10 mg total) by mouth as needed for migraine. May repeat in 2 hours if needed 10 tablet 6   Vitamin D, Cholecalciferol, 50 MCG (2000 UT) CAPS Take 2 capsules by mouth daily.     No current facility-administered medications on file prior to visit.    Past Surgical History:   Procedure Laterality Date   AUGMENTATION MAMMAPLASTY Bilateral    BREAST SURGERY     augmentation   ELBOW SURGERY     HARDWARE REMOVAL Right 02/01/2015   Procedure: RIGHT ELBOW HARDWARE REMOVAL;  Surgeon: Teryl Lucy, MD;  Location:  S[facility]ERY [facility];  Service: Orthopedics;  Laterality: Right;   TONSILLECTOMY     TONSILLECTOMY     WRIST ARTHROSCOPY      No Known Allergies  BP 104/68   Ht 5' 4.5" (1.638 m)   Wt 132 lb (59.9 kg)   BMI 22.31 kg/m       No data to display              No data to display              Objective:  Physical Exam:  Gen: NAD, comfortable in exam room  Back: No gross deformity, scoliosis. No TTP paraspinal muscles.  No midline or bony TTP. FROM. Strength LEs 5/5 all muscle groups.   2+ MSRs in patellar and achilles tendons, equal bilaterally. Negative SLRs. Sensation intact to light touch bilaterally.  Right hip: No deformity. FROM with 5/5 strength including hip abduction and no pain with this. No tenderness to palpation greater trochanter. NVI distally. Negative logroll Negative faber, fadir, and piriformis stretches.   Assessment & Plan:  1. Right leg pain - MRI of hip  reviewed and no source of her pain identified.  While this may miss a small labral tear her exam today and unrelenting nature of pain would seem to go against that as the cause though.  Concern for an atypical lumbar radiculopathy, specifically at L2 or L3 level.  FH positive for back issues and surgery in her mom.  No red flags on history or exam.  Will proceed with prednisone dose pack as well as lumbar spine MRI.  Did offer diagnostic/therapeutic intraarticular hip injection but given how her MRI looks and her exam, it's unlikely to be beneficial and agree with looking for different source of her pain.

## 2022-07-29 NOTE — Patient Instructions (Addendum)
We will go ahead with an MRI of your lumbar spine - I suspect this is referred pain from a pinched nerve in your back which would account for the MRI being essentially normal, the severity of your pain, and the distribution. Start prednisone dose pack as directed x 6 days. We will call you with results and next steps.

## 2022-08-04 ENCOUNTER — Other Ambulatory Visit: Payer: Self-pay | Admitting: Obstetrics and Gynecology

## 2022-08-04 DIAGNOSIS — Z9189 Other specified personal risk factors, not elsewhere classified: Secondary | ICD-10-CM

## 2022-08-06 ENCOUNTER — Encounter: Payer: Self-pay | Admitting: *Deleted

## 2022-08-19 ENCOUNTER — Telehealth: Payer: Managed Care, Other (non HMO) | Admitting: Family Medicine

## 2022-08-19 DIAGNOSIS — M5416 Radiculopathy, lumbar region: Secondary | ICD-10-CM

## 2022-08-20 ENCOUNTER — Other Ambulatory Visit: Payer: Self-pay

## 2022-08-20 DIAGNOSIS — M5416 Radiculopathy, lumbar region: Secondary | ICD-10-CM

## 2022-08-20 NOTE — Progress Notes (Signed)
PCP: Sheliah Hatch, MD  Subjective:   HPI: Patient is a 47 y.o. female here for right hip pain.  4/9: Patient is a 47 y.o. female here for right hip pain that started a little over a year ago. Unsure of where the pain originates but it seems to be in her right hip and radiating down to her gluteal muscle and sometimes into her upper medial thigh. She does pilates 5x a week. She feels that this is affecting her core strength. Describes pain as a deep bone pain, denies neither sharp or dull, aching pain. Feels like a throbbing sensation that is present most of the time. Aggravating factors include certain positions and movements including participating in her pilates classes. Over the past weekend, she did not experience any pain and felt good but later that night went to a restaurant where she sat at a barstool and it started to ache again. Denies numbness or tingling. She cannot seem to find any relieving factors as nothing seems to improve the pain. Gets pain during the night. Has tried tylenol and ibuprofen which used to provide relief and now which has progressively worsened over the past few months.   5/1: Patient came in today to primarily go over MRI results of her right hip and to consider intraarticular injection. Her MRI is essentially normal - no evidence arthropathy or labral tear.  She has some mild gluteal tendinopathy but nothing to explain her right hip pain. She reports this started about a year ago and has been worsening since then. Was posterior then since has been wrapping around anteriorly into groin. Nothing seems to make it better or worse so she's just been pushing through with her normal activities including tennis. No bowel/bladder dysfunction. No numbness/tingling. No back pain. Active with pilates, strengthening of core. Meloxicam and otc medications have not helped.  5/22: Patient follows up virtually today from her kitchen to discuss her progress. She's  continued to stay active, been doing home exercises now since initial visit on 4/9 for her back and hip. Prednisone helped some with pain into her right hip but back pain and radiation here has persisted. Again difficulty getting comfortable and just pushing through pain. No bowel/bladder dysfunction. No numbness/tingling.  Past Medical History:  Diagnosis Date   Allergy    Anxiety    Benign thyroid cyst    aspirated   Depression    Pt denies depression   GERD (gastroesophageal reflux disease)    Headache    History of chicken pox     Current Outpatient Medications on File Prior to Visit  Medication Sig Dispense Refill   Ashwagandha 500 MG CAPS Take 2 capsules by mouth daily.     Barberry-Oreg Grape-Goldenseal (BERBERINE COMPLEX) 200-200-50 MG CAPS Take by mouth.     buPROPion (WELLBUTRIN XL) 150 MG 24 hr tablet TAKE 1 TABLET(150 MG) BY MOUTH DAILY 30 tablet 6   Lactobacillus-Inulin (CULTURELLE DIGESTIVE DAILY PO) Take by mouth.     meloxicam (MOBIC) 15 MG tablet Take 1 tablet daily with food for 5 days. Then take as needed. 40 tablet 1   predniSONE (DELTASONE) 10 MG tablet 6 tabs po day 1, 5 tabs po day 2, 4 tabs po day 3, 3 tabs po day 4, 2 tabs po day 5, 1 tab po day 6 21 tablet 0   Pyridoxine HCl (VITAMIN B-6 PO) Take by mouth.     rizatriptan (MAXALT) 10 MG tablet Take 1 tablet (10 mg total) by  mouth as needed for migraine. May repeat in 2 hours if needed 10 tablet 6   Vitamin D, Cholecalciferol, 50 MCG (2000 UT) CAPS Take 2 capsules by mouth daily.     No current facility-administered medications on file prior to visit.    Past Surgical History:  Procedure Laterality Date   AUGMENTATION MAMMAPLASTY Bilateral    BREAST SURGERY     augmentation   ELBOW SURGERY     HARDWARE REMOVAL Right 02/01/2015   Procedure: RIGHT ELBOW HARDWARE REMOVAL;  Surgeon: Teryl Lucy, MD;  Location: [facility];  Service: Orthopedics;  Laterality: Right;   TONSILLECTOMY      TONSILLECTOMY     WRIST ARTHROSCOPY      No Known Allergies  There were no vitals taken for this visit.      No data to display              No data to display              Objective:  Physical Exam:  Gen: NAD, comfortable at home.  Back: No evidence scoliosis, deformity.  Assessment & Plan:  1. Back pain radiating into right lower extremity - imaging of hip including x-rays and MRI without source of her pain.  Distribution more consistent with mid-lumbar radiculopathy.  She has completed 6 weeks of home exercise program provided by our office/[facility] and continues to be active otherwise.  Only mild benefit with prednisone dose pack.  Will obtain lumbar spine radiographs and, if normal as expected, will proceed with MRI to assess for disc herniation in preparation for epidural injections vs surgical referral.

## 2022-08-25 ENCOUNTER — Ambulatory Visit
Admission: RE | Admit: 2022-08-25 | Discharge: 2022-08-25 | Disposition: A | Discharge status: Home / Self Care | Payer: Managed Care, Other (non HMO) | Source: Ambulatory Visit | Attending: Family Medicine | Admitting: Family Medicine

## 2022-08-25 DIAGNOSIS — M5416 Radiculopathy, lumbar region: Secondary | ICD-10-CM

## 2022-08-31 ENCOUNTER — Other Ambulatory Visit: Payer: Self-pay | Admitting: Obstetrics and Gynecology

## 2022-08-31 DIAGNOSIS — Z1231 Encounter for screening mammogram for malignant neoplasm of breast: Secondary | ICD-10-CM

## 2022-09-18 ENCOUNTER — Encounter: Payer: Self-pay | Admitting: Family Medicine

## 2022-09-18 NOTE — Progress Notes (Unsigned)
MRI reviewed and discussed with patient.  Her pain continues and is fairly severe.  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  Other degenerative findings to the left.  She denies any intraabdominal, GI, GI symptoms.  Advised we go ahead with trial of facet injection right L4-5 - call us a week after injection to let us know how she's doing.  If still not improving would consider follow-up with PCP for abdominal/pelvic evaluation of referred pain.

## 2022-09-21 ENCOUNTER — Other Ambulatory Visit: Payer: Self-pay | Admitting: Family Medicine

## 2022-09-21 ENCOUNTER — Ambulatory Visit: Payer: Managed Care, Other (non HMO) | Admitting: Family Medicine

## 2022-09-21 ENCOUNTER — Other Ambulatory Visit: Payer: Self-pay

## 2022-09-21 DIAGNOSIS — M5416 Radiculopathy, lumbar region: Secondary | ICD-10-CM

## 2022-10-05 ENCOUNTER — Other Ambulatory Visit: Payer: Managed Care, Other (non HMO)

## 2022-10-08 ENCOUNTER — Ambulatory Visit
Admission: RE | Admit: 2022-10-08 | Discharge: 2022-10-08 | Disposition: A | Discharge status: Home / Self Care | Payer: Managed Care, Other (non HMO) | Source: Ambulatory Visit | Attending: Obstetrics and Gynecology | Admitting: Obstetrics and Gynecology

## 2022-10-08 ENCOUNTER — Ambulatory Visit: Payer: Managed Care, Other (non HMO)

## 2022-10-08 ENCOUNTER — Inpatient Hospital Stay: Payer: Managed Care, Other (non HMO)

## 2022-10-08 ENCOUNTER — Inpatient Hospital Stay: Payer: Managed Care, Other (non HMO) | Attending: Hematology and Oncology | Admitting: Hematology and Oncology

## 2022-10-08 ENCOUNTER — Other Ambulatory Visit: Payer: Self-pay

## 2022-10-08 VITALS — BP 128/84 | HR 79 | Temp 97.5°F | Resp 18 | Ht 64.5 in | Wt 140.6 lb

## 2022-10-08 DIAGNOSIS — Z1231 Encounter for screening mammogram for malignant neoplasm of breast: Secondary | ICD-10-CM

## 2022-10-08 DIAGNOSIS — Z1501 Genetic susceptibility to malignant neoplasm of breast: Secondary | ICD-10-CM | POA: Insufficient documentation

## 2022-10-08 DIAGNOSIS — Z1589 Genetic susceptibility to other disease: Secondary | ICD-10-CM

## 2022-10-08 DIAGNOSIS — Z9189 Other specified personal risk factors, not elsewhere classified: Secondary | ICD-10-CM

## 2022-10-08 NOTE — Assessment & Plan Note (Addendum)
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 Based on NCCN guidelines, this is a pathogenic mutation that has been associated with risk of not only breast cancer but also colon, thyroid and kidney cancers.   Pathogenesis: I discussed with the patient that CHEK-2 encodes for a serine-threonine tyrosine kinase involved in the DNA repair in combination with ATM, BRCA1, P53 genes. Patients with mutation of the CHEK-2 gene results in the damaged DNAgetting replicated and increase the patient's risk for cancers.   Surveillance: I recommended annual mammograms and annual breast MRIs combined with monthly self breast examinations and breast examinations with her physician. Patient would like to continue the surveillance plan under the care of her gynecologist. I also discussed different other options including prophylactic bilateral mastectomies. Based on NCCN guidelines, there is no data to support the role of prophylactic bilateral mastectomy for CHEK-2 mutation. However given her familial history, if she chooses to undergo bilateral mastectomy it would not be unreasonable.   Other cancer surveillance: Apart of breast cancer, there are no definite surveillance approaches to kidney cancer. For colon cancer she already had a colonoscopy which was clear.  Tamoxifen discussion: We discussed extensively about 5 mg of tamoxifen for risk reduction.  She is contemplating on it.  We also discussed interaction of tamoxifen with Wellbutrin.  We may have to switch her from Wellbutrin to Effexor before we start tamoxifen.  She will think about all of this and will inform us of her decision.  If she decides to take tamoxifen then we will need to follow  Under periodic basis.  If she decides not to take the tamoxifen at this time then she  could be seen on an as-needed basis.  Dr. Huntley Dec has already set her up for mammograms and breast MRIs.

## 2022-10-08 NOTE — Progress Notes (Signed)
[facility] CONSULT NOTE  Patient Care Team: Sheliah Hatch, MD as PCP - General (Family Medicine)  CHIEF COMPLAINTS/PURPOSE OF CONSULTATION:  CHEK2 mutation  HISTORY OF PRESENTING ILLNESS:  [facility] 47 y.o. female is here because of recent diagnosis of CHEK2 mutation.  Patient's mother had breast cancer and she is going through menopausal changes and Dr. Huntley Dec recommended obtaining genetic testing before starting her on estrogen replacement therapy.  The test result came back surprisingly as positive for CHEK2 mutation.  [facility] calculated her risk to be around 55%.  She was referred to [place] for discussion regarding risk reduction and surveillance.  I reviewed her records extensively and collaborated the history with the patient.  MEDICAL HISTORY:  Past Medical History:  Diagnosis Date   Allergy    Anxiety    Benign thyroid cyst    aspirated   Depression    Pt denies depression   GERD (gastroesophageal reflux disease)    Headache    History of chicken pox     SURGICAL HISTORY: Past Surgical History:  Procedure Laterality Date   AUGMENTATION MAMMAPLASTY Bilateral    BREAST SURGERY     augmentation   ELBOW SURGERY     HARDWARE REMOVAL Right 02/01/2015   Procedure: RIGHT ELBOW HARDWARE REMOVAL;  Surgeon: Teryl Lucy, MD;  Location: [facility];  Service: Orthopedics;  Laterality: Right;   TONSILLECTOMY     TONSILLECTOMY     WRIST ARTHROSCOPY      SOCIAL HISTORY: Social History   Socioeconomic History   Marital status: Married    Spouse name: Not on file   Number of children: Not on file   Years of education: Not on file   Highest education level: Not on file  Occupational History   Not on file  Tobacco Use   Smoking status: Former    Current packs/day: 0.00    Types: Cigarettes    Quit date: 04/14/1993    Years since quitting: 29.5   Smokeless tobacco: Never  Vaping Use   Vaping status: Never Used  Substance and  Sexual Activity   Alcohol use: Yes    Alcohol/week: 1.0 - 2.0 standard drink of alcohol    Types: 1 - 2 Standard drinks or equivalent per week    Comment: month   Drug use: No   Sexual activity: Yes  Other Topics Concern   Not on file  Social History Narrative   Not on file   Social Determinants of Health   Financial Resource Strain: Not on file  Food Insecurity: Not on file  Transportation Needs: Not on file  Physical Activity: Not on file  Stress: Not on file  Social Connections: Unknown (07/22/2022)   Received from [facility]   Social Network    Social Network: Not on file  Intimate Partner Violence: Unknown (07/22/2022)   Received from [facility]   HITS    Physically Hurt: Not on file    Insult or Talk Down To: Not on file    Threaten Physical Harm: Not on file    Scream or Curse: Not on file    FAMILY HISTORY: Family History  Problem Relation Age of Onset   Hypertension Mother    Cancer Mother        breast   Hyperlipidemia Mother    Breast cancer Mother 44       reoccurred at 72 with Paget's   Hypertension Father    Heart disease Father  Colon polyps Sister    Healthy Daughter    Cancer Maternal Grandmother        breast   Hypertension Maternal Grandmother    Stroke Maternal Grandmother    Cancer Maternal Grandfather        colon, liver   Early death Maternal Grandfather    Cancer Paternal Grandmother        lung   Healthy Son    Heart attack Neg Hx    Diabetes Neg Hx    Colon cancer Neg Hx    Rectal cancer Neg Hx    Stomach cancer Neg Hx    Esophageal cancer Neg Hx     ALLERGIES:  has No Known Allergies.  MEDICATIONS:  Current Outpatient Medications  Medication Sig Dispense Refill   Ashwagandha 500 MG CAPS Take 2 capsules by mouth daily.     Barberry-Oreg Grape-Goldenseal (BERBERINE COMPLEX) 200-200-50 MG CAPS Take by mouth.     buPROPion (WELLBUTRIN XL) 150 MG 24 hr tablet TAKE 1 TABLET(150 MG) BY MOUTH DAILY 30 tablet 6    Lactobacillus-Inulin (CULTURELLE DIGESTIVE DAILY PO) Take by mouth.     meloxicam (MOBIC) 15 MG tablet Take 1 tablet daily with food for 5 days. Then take as needed. 40 tablet 1   predniSONE (DELTASONE) 10 MG tablet 6 tabs po day 1, 5 tabs po day 2, 4 tabs po day 3, 3 tabs po day 4, 2 tabs po day 5, 1 tab po day 6 21 tablet 0   Pyridoxine HCl (VITAMIN B-6 PO) Take by mouth.     rizatriptan (MAXALT) 10 MG tablet Take 1 tablet (10 mg total) by mouth as needed for migraine. May repeat in 2 hours if needed 10 tablet 6   Vitamin D, Cholecalciferol, 50 MCG (2000 UT) CAPS Take 2 capsules by mouth daily.     No current facility-administered medications for this visit.    REVIEW OF SYSTEMS:   Constitutional: Denies fevers, chills or abnormal night sweats   All other systems were reviewed with the patient and are negative.  PHYSICAL EXAMINATION: ECOG PERFORMANCE STATUS: 0 - Asymptomatic  Vitals:   10/08/22 1537  BP: 128/84  Pulse: 79  Resp: 18  Temp: (!) 97.5 F (36.4 C)  SpO2: 95%   Filed Weights   10/08/22 1537  Weight: 140 lb 9.6 oz (63.8 kg)    GENERAL:alert, no distress and comfortable    LABORATORY DATA:  I have reviewed the data as listed Lab Results  Component Value Date   WBC 6.7 05/28/2022   HGB 13.7 05/28/2022   HCT 40.5 05/28/2022   MCV 90.1 05/28/2022   PLT 201.0 05/28/2022   Lab Results  Component Value Date   NA 137 05/28/2022   K 3.9 05/28/2022   CL 102 05/28/2022   CO2 27 05/28/2022    RADIOGRAPHIC STUDIES: I have personally reviewed the radiological reports and agreed with the findings in the report.  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 Based on NCCN guidelines, this is a pathogenic mutation that has been  associated with risk of not only breast cancer but also colon, thyroid and kidney cancers.   Pathogenesis: I discussed with the patient that CHEK-2 encodes for a serine-threonine  tyrosine kinase involved in the DNA repair in combination with ATM, BRCA1, P53 genes. Patients with mutation of the CHEK-2 gene results in the damaged DNAgetting replicated and increase the patient's risk for cancers.   Surveillance: I recommended annual mammograms and annual breast MRIs combined with monthly self breast examinations and breast examinations with her physician. Patient would like to continue the surveillance plan under the care of her gynecologist. I also discussed different other options including prophylactic bilateral mastectomies. Based on NCCN guidelines, there is no data to support the role of prophylactic bilateral mastectomy for CHEK-2 mutation. However given her familial history, if she chooses to undergo bilateral mastectomy it would not be unreasonable.   Other cancer surveillance: Apart of breast cancer, there are no definite surveillance approaches to kidney cancer. For colon cancer she already had a colonoscopy which was clear.  Tamoxifen discussion: We discussed extensively about 5 mg of tamoxifen for risk reduction.  She is contemplating on it.  We also discussed interaction of tamoxifen with Wellbutrin.  We may have to switch her from Wellbutrin to Effexor before we start tamoxifen.  She will think about all of this and will inform us of her decision.  If she decides to take tamoxifen then we will need to follow  Under periodic basis.  If she decides not to take the tamoxifen at this time then she could be seen on an as-needed basis.  Dr. Huntley Dec has already set her up for mammograms and breast MRIs.   All questions were answered. The patient knows to call the clinic with any problems, questions or concerns.    Tamsen Meek, MD 10/08/22

## 2022-10-15 ENCOUNTER — Encounter: Payer: Self-pay | Admitting: Family Medicine

## 2022-10-16 ENCOUNTER — Encounter: Payer: Self-pay | Admitting: Family Medicine

## 2022-10-20 NOTE — Discharge Instructions (Signed)

## 2022-10-21 ENCOUNTER — Ambulatory Visit
Admission: RE | Admit: 2022-10-21 | Discharge: 2022-10-21 | Disposition: A | Discharge status: Home / Self Care | Payer: Managed Care, Other (non HMO) | Source: Ambulatory Visit | Attending: Family Medicine | Admitting: Family Medicine

## 2022-10-21 DIAGNOSIS — M5416 Radiculopathy, lumbar region: Secondary | ICD-10-CM

## 2022-10-21 MED ORDER — METHYLPREDNISOLONE ACETATE 40 MG/ML INJ SUSP (RADIOLOG
80.0000 mg | Freq: Once | INTRAMUSCULAR | Status: AC
  Administered 2022-10-21: 60 mg via INTRA_ARTICULAR

## 2022-10-21 MED ORDER — IOPAMIDOL (ISOVUE-M 200) INJECTION 41%
1.0000 mL | Freq: Once | INTRAMUSCULAR | Status: AC
  Administered 2022-10-21: 1 mL via INTRA_ARTICULAR

## 2022-10-28 ENCOUNTER — Encounter (INDEPENDENT_AMBULATORY_CARE_PROVIDER_SITE_OTHER): Payer: Self-pay

## 2022-11-17 ENCOUNTER — Ambulatory Visit (HOSPITAL_BASED_OUTPATIENT_CLINIC_OR_DEPARTMENT_OTHER)
Admission: RE | Admit: 2022-11-17 | Discharge: 2022-11-17 | Disposition: A | Discharge status: Home / Self Care | Payer: Managed Care, Other (non HMO) | Source: Ambulatory Visit | Attending: Hematology and Oncology | Admitting: Hematology and Oncology

## 2022-11-17 DIAGNOSIS — Z9189 Other specified personal risk factors, not elsewhere classified: Secondary | ICD-10-CM | POA: Insufficient documentation

## 2022-11-23 ENCOUNTER — Telehealth: Payer: Self-pay

## 2022-11-23 ENCOUNTER — Ambulatory Visit: Payer: Managed Care, Other (non HMO) | Admitting: Family Medicine

## 2022-11-23 MED ORDER — METHOCARBAMOL 500 MG PO TABS: 500.0000 mg | ORAL_TABLET | Freq: Three times a day (TID) | ORAL | 1 refills | Status: DC | PRN

## 2022-11-23 NOTE — Telephone Encounter (Signed)
Glad that's improved.  I expect the spasms will lessen and resolve.  But I have sent in robaxin to take as needed up to 3 times a day -can make her sleepy.  Focus on heat 15 minutes at a time, stretching as well and keep [place] updated.  If pain starts to recur we can consider repeating the injection.

## 2022-11-24 ENCOUNTER — Encounter: Payer: Self-pay | Admitting: Family Medicine

## 2022-11-25 ENCOUNTER — Ambulatory Visit (INDEPENDENT_AMBULATORY_CARE_PROVIDER_SITE_OTHER): Payer: Managed Care, Other (non HMO) | Admitting: Family Medicine

## 2022-11-25 ENCOUNTER — Encounter: Payer: Self-pay | Admitting: Family Medicine

## 2022-11-25 VITALS — BP 104/72 | HR 66 | Temp 97.8°F | Resp 17 | Ht 64.5 in | Wt 137.2 lb

## 2022-11-25 DIAGNOSIS — Z1589 Genetic susceptibility to other disease: Secondary | ICD-10-CM

## 2022-11-25 DIAGNOSIS — G43829 Menstrual migraine, not intractable, without status migrainosus: Secondary | ICD-10-CM

## 2022-11-25 MED ORDER — UBRELVY 100 MG PO TABS: 1.0000 | ORAL_TABLET | Freq: Every day | ORAL | 3 refills | Status: DC | PRN

## 2022-11-25 NOTE — Assessment & Plan Note (Signed)
New to provider.  Genetic testing was done this summer and returned +.  She has seen Oncology and discussed this with surgery.  Both are recommending prophylactic Tamoxifen to lower her risk.  She is very apprehensive about this b/c she is currently feeling well and doesn't want to deal w/ side effects from the Tamoxifen.  But knows that if she doesn't take the medication and she develops breast cancer, she would feel guilty.  Will wean her off Wellbutrin in order for her to start low dose Tamoxifen and see if she is able to tolerate this.  Will hold on starting additional SSRI or SNRI so pt will be able to determine if she is having side effects or not.  If she finds that she needs something for anxiety or depression, it was recommended she start Venlafaxine.  Will follow closely.

## 2022-11-25 NOTE — Progress Notes (Signed)
   Subjective:    Patient ID: [facility], female    DOB: 1975-10-18, 47 y.o.   MRN: [id number]  HPI CHEK2 gene mutation- pt was struggling w/ menopausal sxs and GYN recommended genetic testing prior to starting any hormones b/c mom and grandmother had breast cancer.  Pt was CHEK2 positive which increases risk of breast cancers and other malignancies.  GYN referred to oncology for consultation.  Tamoxifen has been recommended as a preventative measure.  Tamoxifen and Wellbutrin interact and she was told that she either needs to come off the Wellbutrin or increase her dose of Tamoxifen.  She is anxious about starting Tamoxifen b/c she doesn't want to have worsening hot flashes, weight gain, or other sxs.  But knows that if she doesn't do anything and develops cancer, she will blame herself.  Migraines- reports her migraines are hormonally triggered.  She finds that she is frequently having to take a 2nd dose of Ubrelvy and is then running out of medication.   Review of Systems For ROS see HPI     Objective:   Physical Exam Vitals reviewed.  Constitutional:      General: She is not in acute distress.    Appearance: Normal appearance. She is not ill-appearing.  HENT:     Head: Normocephalic and atraumatic.  Skin:    General: Skin is warm and dry.  Neurological:     General: No focal deficit present.     Mental Status: She is alert and oriented to person, place, and time.  Psychiatric:        Mood and Affect: Mood normal.        Behavior: Behavior normal.        Thought Content: Thought content normal.           Assessment & Plan:

## 2022-11-25 NOTE — Patient Instructions (Signed)
Follow up as needed or as scheduled Decrease the Wellbutrin to every other day for 2 weeks and then stop IF coming off the Wellbutrin is difficult upon stopping, let me know and we can do a short term short acting 75mg  dose Call with any questions or concerns Hang in there!!!

## 2022-11-25 NOTE — Assessment & Plan Note (Signed)
Ongoing issue.  Will increase Ubrelvy to 100mg  prn.  Pt expressed understanding and is in agreement w/ plan.

## 2023-01-29 ENCOUNTER — Encounter: Payer: Self-pay | Admitting: Family Medicine

## 2023-02-23 ENCOUNTER — Other Ambulatory Visit: Payer: Self-pay

## 2023-02-23 DIAGNOSIS — M5416 Radiculopathy, lumbar region: Secondary | ICD-10-CM

## 2023-03-02 ENCOUNTER — Other Ambulatory Visit: Payer: Self-pay | Admitting: Family Medicine

## 2023-03-02 DIAGNOSIS — M5416 Radiculopathy, lumbar region: Secondary | ICD-10-CM

## 2023-03-03 ENCOUNTER — Encounter: Payer: Self-pay | Admitting: Obstetrics and Gynecology

## 2023-03-07 ENCOUNTER — Ambulatory Visit
Admission: RE | Admit: 2023-03-07 | Discharge: 2023-03-07 | Disposition: A | Discharge status: Home / Self Care | Payer: Managed Care, Other (non HMO) | Source: Ambulatory Visit | Attending: Obstetrics and Gynecology | Admitting: Obstetrics and Gynecology

## 2023-03-07 DIAGNOSIS — Z9189 Other specified personal risk factors, not elsewhere classified: Secondary | ICD-10-CM

## 2023-03-07 MED ORDER — GADOPICLENOL 0.5 MMOL/ML IV SOLN
6.0000 mL | Freq: Once | INTRAVENOUS | Status: AC | PRN
  Administered 2023-03-07: 6 mL via INTRAVENOUS

## 2023-03-09 ENCOUNTER — Other Ambulatory Visit: Payer: Self-pay | Admitting: Obstetrics and Gynecology

## 2023-03-09 DIAGNOSIS — N6310 Unspecified lump in the right breast, unspecified quadrant: Secondary | ICD-10-CM

## 2023-03-11 ENCOUNTER — Other Ambulatory Visit: Payer: Self-pay | Admitting: Obstetrics and Gynecology

## 2023-03-11 ENCOUNTER — Ambulatory Visit
Admission: RE | Admit: 2023-03-11 | Discharge: 2023-03-11 | Disposition: A | Discharge status: Home / Self Care | Payer: Managed Care, Other (non HMO) | Source: Ambulatory Visit | Attending: Obstetrics and Gynecology | Admitting: Obstetrics and Gynecology

## 2023-03-11 DIAGNOSIS — N6310 Unspecified lump in the right breast, unspecified quadrant: Secondary | ICD-10-CM

## 2023-03-11 HISTORY — PX: BREAST BIOPSY: SHX20

## 2023-03-11 NOTE — Discharge Instructions (Signed)

## 2023-03-12 ENCOUNTER — Other Ambulatory Visit: Payer: Self-pay | Admitting: Pulmonary Disease

## 2023-03-12 ENCOUNTER — Ambulatory Visit
Admission: RE | Admit: 2023-03-12 | Discharge: 2023-03-12 | Disposition: A | Discharge status: Home / Self Care | Payer: Managed Care, Other (non HMO) | Source: Ambulatory Visit | Attending: Family Medicine | Admitting: Family Medicine

## 2023-03-12 ENCOUNTER — Other Ambulatory Visit: Payer: Self-pay | Admitting: Obstetrics and Gynecology

## 2023-03-12 DIAGNOSIS — R928 Other abnormal and inconclusive findings on diagnostic imaging of breast: Secondary | ICD-10-CM

## 2023-03-12 DIAGNOSIS — M5416 Radiculopathy, lumbar region: Secondary | ICD-10-CM

## 2023-03-12 LAB — SURGICAL PATHOLOGY

## 2023-03-12 MED ORDER — IOPAMIDOL (ISOVUE-M 200) INJECTION 41%
1.0000 mL | Freq: Once | INTRAMUSCULAR | Status: AC
  Administered 2023-03-12: 1 mL via INTRA_ARTICULAR

## 2023-03-12 MED ORDER — METHYLPREDNISOLONE ACETATE 40 MG/ML INJ SUSP (RADIOLOG
80.0000 mg | Freq: Once | INTRAMUSCULAR | Status: AC
  Administered 2023-03-12: 80 mg via INTRA_ARTICULAR

## 2023-03-13 ENCOUNTER — Other Ambulatory Visit: Payer: Managed Care, Other (non HMO)

## 2023-03-15 ENCOUNTER — Ambulatory Visit: Payer: Self-pay | Admitting: Surgery

## 2023-03-15 DIAGNOSIS — D0511 Intraductal carcinoma in situ of right breast: Secondary | ICD-10-CM | POA: Insufficient documentation

## 2023-03-17 ENCOUNTER — Telehealth: Payer: Self-pay | Admitting: Hematology and Oncology

## 2023-03-17 ENCOUNTER — Inpatient Hospital Stay
Admission: RE | Admit: 2023-03-17 | Discharge: 2023-03-17 | Discharge status: Home / Self Care | Payer: Managed Care, Other (non HMO) | Source: Ambulatory Visit | Attending: Obstetrics and Gynecology | Admitting: Obstetrics and Gynecology

## 2023-03-17 DIAGNOSIS — R928 Other abnormal and inconclusive findings on diagnostic imaging of breast: Secondary | ICD-10-CM

## 2023-03-17 MED ORDER — GADOPICLENOL 0.5 MMOL/ML IV SOLN
6.0000 mL | Freq: Once | INTRAVENOUS | Status: AC | PRN
  Administered 2023-03-17: 6 mL via INTRAVENOUS

## 2023-03-17 NOTE — Telephone Encounter (Signed)
Left patient a message in regards to scheduled visit, left callback number for scheduling

## 2023-03-18 ENCOUNTER — Encounter: Payer: Self-pay | Admitting: *Deleted

## 2023-03-18 ENCOUNTER — Inpatient Hospital Stay: Payer: Managed Care, Other (non HMO) | Attending: Hematology and Oncology | Admitting: Hematology and Oncology

## 2023-03-18 ENCOUNTER — Other Ambulatory Visit: Payer: Managed Care, Other (non HMO)

## 2023-03-18 VITALS — BP 133/85 | HR 80 | Temp 97.2°F | Resp 18 | Ht 64.5 in | Wt 131.0 lb

## 2023-03-18 DIAGNOSIS — Z1589 Genetic susceptibility to other disease: Secondary | ICD-10-CM | POA: Diagnosis not present

## 2023-03-18 DIAGNOSIS — Z1501 Genetic susceptibility to malignant neoplasm of breast: Secondary | ICD-10-CM | POA: Diagnosis present

## 2023-03-18 LAB — SURGICAL PATHOLOGY

## 2023-03-18 NOTE — Progress Notes (Signed)
Patient Care Team: Sheliah Hatch, MD as PCP - General (Family Medicine)  DIAGNOSIS:  Encounter Diagnosis  Name Primary?   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 This diagnosis was confirmed through biopsy and the results were discussed with the patient. The patient reports that this is a new finding, as previous mammograms had been clear. The patient has been managing well, having successfully weaned off Wellbutrin and maintaining an active lifestyle with regular Pilates. However, the recent diagnosis has caused some stress and weight loss. The patient is scheduled for a double mastectomy and is considering the implications of the surgery, including potential loss of sensation in the breast.         ALLERGIES:  has no known allergies.  MEDICATIONS:  Current Outpatient Medications  Medication Sig Dispense Refill   buPROPion (WELLBUTRIN XL) 150 MG 24 hr tablet TAKE 1 TABLET(150 MG) BY MOUTH DAILY 30 tablet 6   meloxicam (MOBIC) 15 MG tablet Take 1 tablet daily with food for 5 days. Then take as needed. 40 tablet 1   methocarbamol (ROBAXIN) 500 MG tablet Take 1 tablet (500 mg total) by mouth every 8 (eight) hours as needed for muscle spasms. 60 tablet 1   Pyridoxine HCl (VITAMIN B-6 PO) Take by mouth.     Ubrogepant (UBRELVY) 100 MG TABS Take 1 tablet (100 mg total) by mouth daily as needed (migraine.  May repeat in 2 hrs if needed). 30 tablet 3   Vitamin D, Cholecalciferol, 50 MCG (2000 UT) CAPS Take 2 capsules by mouth daily.     No current facility-administered medications for this visit.    PHYSICAL EXAMINATION: ECOG PERFORMANCE STATUS: 1 - Symptomatic but completely ambulatory  Vitals:   03/18/23  1213  BP: 133/85  Pulse: 80  Resp: 18  Temp: (!) 97.2 F (36.2 C)  SpO2: 100%   Filed Weights   03/18/23 1213  Weight: 131 lb (59.4 kg)    LABORATORY DATA:  I have reviewed the data as listed    Latest Ref Rng & Units 05/28/2022    2:06 PM 04/29/2021    8:53 AM 04/24/2020    1:18 PM  CMP  Glucose 70 - 99 mg/dL 83  84  81   BUN 6 - 23 mg/dL 13  25  19    Creatinine 0.40 - 1.20 mg/dL 1.61  0.96  0.45   Sodium 135 - 145 mEq/L 137  139  136   Potassium 3.5 - 5.1 mEq/L 3.9  3.8  4.0   Chloride 96 - 112 mEq/L 102  104  103   CO2 19 - 32 mEq/L 27  30  28    Calcium 8.4 - 10.5 mg/dL 9.7  8.9  9.5   Total Protein 6.0 - 8.3 g/dL 7.1  6.2  7.0   Total Bilirubin 0.2 - 1.2 mg/dL 0.7  0.5  0.7   Alkaline Phos 39 - 117 U/L 42  39  41   AST 0 - 37 U/L 23  20  19    ALT 0 - 35 U/L 15  14  11      Lab Results  Component Value Date   WBC 6.7 05/28/2022   HGB 13.7 05/28/2022  HCT 40.5 05/28/2022   MCV 90.1 05/28/2022   PLT 201.0 05/28/2022   NEUTROABS 4.0 05/28/2022    ASSESSMENT & PLAN:  CHEK2 gene mutation positive [facility] my risk genetic test: May 2024: 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 ER and PR positive, HER2 status pending. Patient has a CHEK2 gene mutation. Discussed the plan for bilateral mastectomies and the potential need for antiestrogen therapy post-surgery. - Proceed with bilateral mastectomies. - Await final surgical pathology to determine need for antiestrogen therapy.  General Health Maintenance Discussed  the importance of regular breast cancer screening for patient's sister given family history and high breast density. - Consider contrast-enhanced mammograms for patient's sister.  Follow-up Plan to review final surgical pathology and discuss potential need for antiestrogen therapy. - Schedule follow-up appointment on 04/15/2023.          No orders of the defined types were placed in this encounter.  The patient has a good understanding of the overall plan. she agrees with it. she will call with any problems that may develop before the next visit here. Total time spent: 30 mins including face to face time and time spent for planning, charting and co-ordination of care   Tamsen Meek, MD 03/18/23

## 2023-03-18 NOTE — Assessment & Plan Note (Signed)
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  Return to clinic on an as-needed basis.

## 2023-03-19 ENCOUNTER — Inpatient Hospital Stay: Payer: Managed Care, Other (non HMO) | Admitting: Hematology and Oncology

## 2023-03-19 ENCOUNTER — Encounter: Payer: Self-pay | Admitting: *Deleted

## 2023-03-19 ENCOUNTER — Other Ambulatory Visit: Payer: Self-pay

## 2023-03-19 ENCOUNTER — Encounter (HOSPITAL_BASED_OUTPATIENT_CLINIC_OR_DEPARTMENT_OTHER): Payer: Self-pay | Admitting: Surgery

## 2023-03-22 ENCOUNTER — Encounter: Payer: Self-pay | Admitting: *Deleted

## 2023-03-22 ENCOUNTER — Ambulatory Visit: Payer: Self-pay | Admitting: Surgery

## 2023-03-23 ENCOUNTER — Telehealth: Payer: Self-pay | Admitting: Hematology and Oncology

## 2023-03-23 NOTE — Telephone Encounter (Signed)
Patient is aware of scheduled appointment times/dates

## 2023-03-23 NOTE — H&P (Signed)
ubjective Patient ID: Kristina King is a 47 y.o. female.     HPI   Returns for follow up discussion breast reconstruction. Patient's mother and MGM with breast ca. Patient obtained genetic testing earlier this year demonstrating CHEK2 mutation. MMG 09/2022 BIRAD1. Patient had been in process of starting tamoxifen for risk reduction when screening MRI 02/2023 demonstrated 1.1 cm right UOQ mass and right UOQ NME 4 x 1.5 x 1.5 cm. US showed masslike area in the right breast at 10 o'clock measuring 5 cm, felt to correspond to the NME on MRI. This extends to implant capsule. No sonographic correlate for the mass identified on MRI. US guided biopsy labeled right breast 10 o clock showed intermediate grade DCIS ER/PR+. MR guided biopsy labeled right breast outer showed microscopic focus IDC with DCIS.    Patient has elected for bilateral mastectomies. She has discussed NSM with Dr. Luisa Hart. She will require capsulectomy on right given span on imaging.   Patient underwent augmentation mammaplasty with Dr. Benna Dunks in 2009 submuscular saline. Current implants Mentor smooth round moderate profile 325 ml implants, fill volume 350 ml REF [phone] bilateral.   Prior to augmentation"little B." [name], happy with current volume.   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 Lives with spouse that accompanies her and has two kids in college. Older is at [employer] and will graduate in May, [name]     Review of Systems  All other systems reviewed and are negative.     Objective Physical Exam  Cardiovascular: Normal rate, regular rhythm and normal heart sounds.    Pulmonary/Chest Effort normal and breath sounds normal.    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   Reviewed options prosthesis and provided brochure for [facility]. Discussed immediate vs delayed, autologous vs implant based reconstruction. Reviewed incisions, drains, OR length, hospital stay and recovery for each. Discussed process of expansion and implant based risks including rupture, imaging surveillance for silicone implants, infection requiring surgery or removal, contracture.  Discussed TRAM vs DIEP, latter would require treatment at tertiary center. Discussed abdominal based complications including failure flap, abdominal bulge or hernia. Patient has minimal soft tissue donor sites for reconstruction. If patient desires purely autologous I anticipate she would require multiple donor sites and that she should consult with a microsurgeon. Discussed future surgery dependent on adjuvant treatments.   Reviewed SSM vs NSM, both will be asensate and not stimulate. Reviewed with both risks mastectomy flap necrosis requiring additional surgery. Reviewed with NSM some women experience elevation nipples at all times. Patient states that this has already been present since time of augmentation. We also discussed nerve grafting is not offered by myself or in local area. Provided names of provider at [facility] that may offer this. Counseled that most often nerve grafting done at time of mastectomies.    Patient is interested in DTI. Counseled that even with DTI it  is common to have additional surgery for procedures such as fat grafting. Reviewed my preference for TE placement as I can control the pressure applied to mastectomy flaps. Reviewed if significant skin flap necrosis occurs whether with DTI or TE may require removal devices  and delayed reconstruction. She has elected for immediate TE placement.   Reviewed her animation present will be more noticeable post mastectomy if device placed in subpectoral position. Reviewed muscle will adhere to skin flap and skin flap with develop depression and implant will move with pectoralis contraction. Alternative to switch to prepectoral position. Reviewed use of ADM in either case. Discussed use of acellular dermis in reconstruction, cadaveric source, incorporation over several weeks, risk that if has seroma or infection can act as additional nidus for infection if not incorporated. This is off label use of ADM. Discussed with patient and benefit of prepectoral positions is no animation deformity, may be less pain. Risk may be more visible rippling over upper poles, greater need of ADM. Plan move to prepectoral position.   Additional risks including but not limited to bleeding hematoma seroma damage to adjacent structures need for additional procedures unacceptable cosmetic result blood clots in legs or lungs asymmetry infection reviewed.   Drain teaching completed. Rx for oxycodone Bactrim and robaxin given.   Glenna Fellows, MD [facility] Plastic & Reconstructive Surgery  Office/ physician access line after hours [phone]

## 2023-04-02 ENCOUNTER — Ambulatory Visit (HOSPITAL_BASED_OUTPATIENT_CLINIC_OR_DEPARTMENT_OTHER): Payer: Managed Care, Other (non HMO) | Admitting: Anesthesiology

## 2023-04-02 ENCOUNTER — Encounter (HOSPITAL_BASED_OUTPATIENT_CLINIC_OR_DEPARTMENT_OTHER)
Admission: RE | Disposition: A | Discharge status: Home / Self Care | Payer: Self-pay | Source: Home / Self Care | Attending: Plastic Surgery

## 2023-04-02 ENCOUNTER — Other Ambulatory Visit: Payer: Self-pay

## 2023-04-02 ENCOUNTER — Encounter (HOSPITAL_BASED_OUTPATIENT_CLINIC_OR_DEPARTMENT_OTHER): Payer: Self-pay | Admitting: Surgery

## 2023-04-02 ENCOUNTER — Ambulatory Visit: Payer: Self-pay | Admitting: Surgery

## 2023-04-02 ENCOUNTER — Ambulatory Visit (HOSPITAL_BASED_OUTPATIENT_CLINIC_OR_DEPARTMENT_OTHER)
Admission: RE | Admit: 2023-04-02 | Discharge: 2023-04-03 | Disposition: A | Discharge status: Home / Self Care | Payer: Managed Care, Other (non HMO) | Attending: Plastic Surgery | Admitting: Plastic Surgery

## 2023-04-02 DIAGNOSIS — C50411 Malignant neoplasm of upper-outer quadrant of right female breast: Secondary | ICD-10-CM | POA: Insufficient documentation

## 2023-04-02 DIAGNOSIS — Z1721 Progesterone receptor positive status: Secondary | ICD-10-CM | POA: Diagnosis not present

## 2023-04-02 DIAGNOSIS — Z1501 Genetic susceptibility to malignant neoplasm of breast: Secondary | ICD-10-CM | POA: Insufficient documentation

## 2023-04-02 DIAGNOSIS — Z9882 Breast implant status: Secondary | ICD-10-CM | POA: Insufficient documentation

## 2023-04-02 DIAGNOSIS — R519 Headache, unspecified: Secondary | ICD-10-CM | POA: Insufficient documentation

## 2023-04-02 DIAGNOSIS — Z170 Estrogen receptor positive status [ER+]: Secondary | ICD-10-CM | POA: Diagnosis not present

## 2023-04-02 DIAGNOSIS — Z803 Family history of malignant neoplasm of breast: Secondary | ICD-10-CM | POA: Insufficient documentation

## 2023-04-02 DIAGNOSIS — Z87891 Personal history of nicotine dependence: Secondary | ICD-10-CM | POA: Diagnosis not present

## 2023-04-02 DIAGNOSIS — Z1502 Genetic susceptibility to malignant neoplasm of ovary: Secondary | ICD-10-CM

## 2023-04-02 DIAGNOSIS — N6032 Fibrosclerosis of left breast: Secondary | ICD-10-CM | POA: Diagnosis not present

## 2023-04-02 DIAGNOSIS — D0511 Intraductal carcinoma in situ of right breast: Secondary | ICD-10-CM | POA: Diagnosis not present

## 2023-04-02 DIAGNOSIS — C50919 Malignant neoplasm of unspecified site of unspecified female breast: Secondary | ICD-10-CM

## 2023-04-02 HISTORY — PX: BREAST RECONSTRUCTION WITH PLACEMENT OF TISSUE EXPANDER AND ALLODERM: SHX6805

## 2023-04-02 HISTORY — PX: SIMPLE MASTECTOMY WITH AXILLARY SENTINEL NODE BIOPSY: SHX6098

## 2023-04-02 HISTORY — DX: Intraductal carcinoma in situ of unspecified breast: D05.10

## 2023-04-02 HISTORY — PX: NIPPLE SPARING MASTECTOMY: SHX6537

## 2023-04-02 LAB — POCT PREGNANCY, URINE: Preg Test, Ur: NEGATIVE

## 2023-04-02 SURGERY — MASTECTOMY, NIPPLE SPARING
Anesthesia: General | Site: Breast | Laterality: Right

## 2023-04-02 MED ORDER — CEFAZOLIN SODIUM-DEXTROSE 2-4 GM/100ML-% IV SOLN
INTRAVENOUS | Status: AC
  Filled 2023-04-02: qty 100

## 2023-04-02 MED ORDER — ONDANSETRON HCL 4 MG/2ML IJ SOLN
INTRAMUSCULAR | Status: DC | PRN
  Administered 2023-04-02: 4 mg via INTRAVENOUS

## 2023-04-02 MED ORDER — PROPOFOL 10 MG/ML IV BOLUS
INTRAVENOUS | Status: DC | PRN
  Administered 2023-04-02: 130 mg via INTRAVENOUS

## 2023-04-02 MED ORDER — PROPOFOL 10 MG/ML IV BOLUS
INTRAVENOUS | Status: AC
  Filled 2023-04-02: qty 20

## 2023-04-02 MED ORDER — ACETAMINOPHEN 10 MG/ML IV SOLN
INTRAVENOUS | Status: AC
  Filled 2023-04-02: qty 100

## 2023-04-02 MED ORDER — ONDANSETRON HCL 4 MG/2ML IJ SOLN: 4.0000 mg | Freq: Four times a day (QID) | INTRAMUSCULAR | Status: DC | PRN

## 2023-04-02 MED ORDER — HYDROMORPHONE HCL 1 MG/ML IJ SOLN
0.5000 mg | INTRAMUSCULAR | Status: DC | PRN
  Administered 2023-04-02: 0.5 mg via INTRAVENOUS

## 2023-04-02 MED ORDER — DEXAMETHASONE SODIUM PHOSPHATE 10 MG/ML IJ SOLN
INTRAMUSCULAR | Status: AC
  Filled 2023-04-02: qty 1

## 2023-04-02 MED ORDER — SODIUM CHLORIDE 0.9 % IV SOLN
INTRAVENOUS | Status: DC | PRN
  Administered 2023-04-02: 500 mL

## 2023-04-02 MED ORDER — MIDAZOLAM HCL 2 MG/2ML IJ SOLN
INTRAMUSCULAR | Status: AC
  Filled 2023-04-02: qty 2

## 2023-04-02 MED ORDER — BUPIVACAINE-EPINEPHRINE (PF) 0.25% -1:200000 IJ SOLN
INTRAMUSCULAR | Status: DC | PRN
Start: 2023-04-02 — End: 2023-04-02
  Administered 2023-04-02 (×2): 20 mL via PERINEURAL

## 2023-04-02 MED ORDER — ONDANSETRON HCL 4 MG/2ML IJ SOLN
INTRAMUSCULAR | Status: AC
  Filled 2023-04-02: qty 2

## 2023-04-02 MED ORDER — UBROGEPANT 100 MG PO TABS: 1.0000 | ORAL_TABLET | Freq: Every day | ORAL | Status: DC | PRN

## 2023-04-02 MED ORDER — LACTATED RINGERS IV SOLN
INTRAVENOUS | Status: DC
Start: 2023-04-02 — End: 2023-04-02

## 2023-04-02 MED ORDER — OXYCODONE HCL 5 MG PO TABS
5.0000 mg | ORAL_TABLET | ORAL | Status: DC | PRN
  Administered 2023-04-02 – 2023-04-03 (×2): 10 mg via ORAL
  Filled 2023-04-02 (×3): qty 2

## 2023-04-02 MED ORDER — KETOROLAC TROMETHAMINE 30 MG/ML IJ SOLN
30.0000 mg | Freq: Three times a day (TID) | INTRAMUSCULAR | Status: DC
  Administered 2023-04-02 – 2023-04-03 (×2): 30 mg via INTRAVENOUS
  Filled 2023-04-02: qty 1

## 2023-04-02 MED ORDER — BUPIVACAINE LIPOSOME 1.3 % IJ SUSP
INTRAMUSCULAR | Status: DC | PRN
  Administered 2023-04-02 (×2): 10 mL via PERINEURAL

## 2023-04-02 MED ORDER — LIDOCAINE 2% (20 MG/ML) 5 ML SYRINGE
INTRAMUSCULAR | Status: DC | PRN
  Administered 2023-04-02: 60 mg via INTRAVENOUS

## 2023-04-02 MED ORDER — ROCURONIUM BROMIDE 10 MG/ML (PF) SYRINGE
PREFILLED_SYRINGE | INTRAVENOUS | Status: AC
  Filled 2023-04-02: qty 10

## 2023-04-02 MED ORDER — CEFAZOLIN SODIUM-DEXTROSE 2-4 GM/100ML-% IV SOLN
2.0000 g | Freq: Three times a day (TID) | INTRAVENOUS | Status: AC
  Administered 2023-04-02: 2 g via INTRAVENOUS
  Filled 2023-04-02 (×2): qty 100

## 2023-04-02 MED ORDER — SUGAMMADEX SODIUM 200 MG/2ML IV SOLN
INTRAVENOUS | Status: DC | PRN
  Administered 2023-04-02: 200 mg via INTRAVENOUS

## 2023-04-02 MED ORDER — FENTANYL CITRATE (PF) 100 MCG/2ML IJ SOLN
100.0000 ug | Freq: Once | INTRAMUSCULAR | Status: AC
  Administered 2023-04-02: 100 ug via INTRAVENOUS

## 2023-04-02 MED ORDER — ROCURONIUM BROMIDE 10 MG/ML (PF) SYRINGE
PREFILLED_SYRINGE | INTRAVENOUS | Status: DC | PRN
  Administered 2023-04-02: 50 mg via INTRAVENOUS
  Administered 2023-04-02: 30 mg via INTRAVENOUS

## 2023-04-02 MED ORDER — PHENYLEPHRINE 80 MCG/ML (10ML) SYRINGE FOR IV PUSH (FOR BLOOD PRESSURE SUPPORT)
PREFILLED_SYRINGE | INTRAVENOUS | Status: AC
Start: 2023-04-02 — End: ?
  Filled 2023-04-02: qty 10

## 2023-04-02 MED ORDER — DEXAMETHASONE SODIUM PHOSPHATE 10 MG/ML IJ SOLN
INTRAMUSCULAR | Status: DC | PRN
  Administered 2023-04-02: 10 mg via INTRAVENOUS

## 2023-04-02 MED ORDER — CEFAZOLIN SODIUM-DEXTROSE 2-4 GM/100ML-% IV SOLN
2.0000 g | INTRAVENOUS | Status: AC
  Administered 2023-04-02: 2 g via INTRAVENOUS

## 2023-04-02 MED ORDER — FENTANYL CITRATE (PF) 100 MCG/2ML IJ SOLN
INTRAMUSCULAR | Status: AC
  Filled 2023-04-02: qty 2

## 2023-04-02 MED ORDER — SODIUM CHLORIDE 0.9 % IV SOLN
INTRAVENOUS | Status: AC
  Filled 2023-04-02: qty 10

## 2023-04-02 MED ORDER — ENOXAPARIN SODIUM 40 MG/0.4ML IJ SOSY
40.0000 mg | PREFILLED_SYRINGE | INTRAMUSCULAR | Status: DC
  Administered 2023-04-03: 40 mg via SUBCUTANEOUS
  Filled 2023-04-02: qty 0.4

## 2023-04-02 MED ORDER — ACETAMINOPHEN 10 MG/ML IV SOLN
INTRAVENOUS | Status: DC | PRN
  Administered 2023-04-02: 1000 mg via INTRAVENOUS

## 2023-04-02 MED ORDER — SCOPOLAMINE 1 MG/3DAYS TD PT72
MEDICATED_PATCH | TRANSDERMAL | Status: AC
  Filled 2023-04-02: qty 1

## 2023-04-02 MED ORDER — MAGTRACE LYMPHATIC TRACER
INTRAMUSCULAR | Status: DC | PRN
  Administered 2023-04-02: 2 mL via INTRAMUSCULAR

## 2023-04-02 MED ORDER — ONDANSETRON 4 MG PO TBDP: 4.0000 mg | ORAL_TABLET | Freq: Four times a day (QID) | ORAL | Status: DC | PRN

## 2023-04-02 MED ORDER — HYDROMORPHONE HCL 1 MG/ML IJ SOLN
INTRAMUSCULAR | Status: DC | PRN
  Administered 2023-04-02: .5 mg via INTRAVENOUS

## 2023-04-02 MED ORDER — HYDROMORPHONE HCL 1 MG/ML IJ SOLN
INTRAMUSCULAR | Status: AC
  Filled 2023-04-02: qty 0.5

## 2023-04-02 MED ORDER — METHOCARBAMOL 500 MG PO TABS
500.0000 mg | ORAL_TABLET | Freq: Four times a day (QID) | ORAL | Status: DC | PRN
  Administered 2023-04-02 – 2023-04-03 (×2): 500 mg via ORAL
  Filled 2023-04-02 (×2): qty 1

## 2023-04-02 MED ORDER — KETOROLAC TROMETHAMINE 30 MG/ML IJ SOLN
INTRAMUSCULAR | Status: AC
  Filled 2023-04-02: qty 1

## 2023-04-02 MED ORDER — LIDOCAINE 2% (20 MG/ML) 5 ML SYRINGE
INTRAMUSCULAR | Status: AC
  Filled 2023-04-02: qty 5

## 2023-04-02 MED ORDER — KETAMINE HCL 50 MG/5ML IJ SOSY
PREFILLED_SYRINGE | INTRAMUSCULAR | Status: DC | PRN
Start: 2023-04-02 — End: 2023-04-02
  Administered 2023-04-02: 25 mg via INTRAVENOUS

## 2023-04-02 MED ORDER — MIDAZOLAM HCL 5 MG/5ML IJ SOLN
INTRAMUSCULAR | Status: DC | PRN
  Administered 2023-04-02: 2 mg via INTRAVENOUS

## 2023-04-02 MED ORDER — SCOPOLAMINE 1 MG/3DAYS TD PT72
MEDICATED_PATCH | TRANSDERMAL | Status: DC | PRN
  Administered 2023-04-02: 1 via TRANSDERMAL

## 2023-04-02 MED ORDER — POVIDONE-IODINE 10 % EX SOLN
CUTANEOUS | Status: DC | PRN
  Administered 2023-04-02: 1 via TOPICAL

## 2023-04-02 MED ORDER — 0.9 % SODIUM CHLORIDE (POUR BTL) OPTIME
TOPICAL | Status: DC | PRN
  Administered 2023-04-02: 1000 mL

## 2023-04-02 MED ORDER — PHENYLEPHRINE HCL-NACL 20-0.9 MG/250ML-% IV SOLN
INTRAVENOUS | Status: DC | PRN
  Administered 2023-04-02: 50 ug via INTRAVENOUS
  Administered 2023-04-02: 40 ug/min via INTRAVENOUS
  Administered 2023-04-02: 50 ug via INTRAVENOUS
  Administered 2023-04-02: 100 ug via INTRAVENOUS
  Administered 2023-04-02: 50 ug via INTRAVENOUS

## 2023-04-02 MED ORDER — FENTANYL CITRATE (PF) 100 MCG/2ML IJ SOLN
INTRAMUSCULAR | Status: DC | PRN
  Administered 2023-04-02 (×2): 50 ug via INTRAVENOUS

## 2023-04-02 MED ORDER — KETAMINE HCL 50 MG/5ML IJ SOSY
PREFILLED_SYRINGE | INTRAMUSCULAR | Status: AC
Start: 2023-04-02 — End: ?
  Filled 2023-04-02: qty 5

## 2023-04-02 MED ORDER — MIDAZOLAM HCL 2 MG/2ML IJ SOLN
2.0000 mg | Freq: Once | INTRAMUSCULAR | Status: AC
  Administered 2023-04-02: 2 mg via INTRAVENOUS

## 2023-04-02 SURGICAL SUPPLY — 93 items
ALLOGRAFT PERF 16X20 1.6+/-0.4 (Tissue) IMPLANT
APPLIER CLIP 11 MED OPEN (CLIP)
APPLIER CLIP 9.375 MED OPEN (MISCELLANEOUS) ×4
BAG DECANTER FOR FLEXI CONT (MISCELLANEOUS) ×4 IMPLANT
BENZOIN TINCTURE PRP APPL 2/3 (GAUZE/BANDAGES/DRESSINGS) IMPLANT
BINDER BREAST LRG (GAUZE/BANDAGES/DRESSINGS) IMPLANT
BINDER BREAST MEDIUM (GAUZE/BANDAGES/DRESSINGS) IMPLANT
BINDER BREAST XLRG (GAUZE/BANDAGES/DRESSINGS) IMPLANT
BINDER BREAST XXLRG (GAUZE/BANDAGES/DRESSINGS) IMPLANT
BIOPATCH RED 1 DISK 7.0 (GAUZE/BANDAGES/DRESSINGS) IMPLANT
BLADE HEX COATED 2.75 (ELECTRODE) ×4 IMPLANT
BLADE SURG 10 STRL SS (BLADE) ×4 IMPLANT
BLADE SURG 15 STRL LF DISP TIS (BLADE) ×4 IMPLANT
BNDG ELASTIC 6INX 5YD STR LF (GAUZE/BANDAGES/DRESSINGS) ×4 IMPLANT
BNDG GAUZE DERMACEA FLUFF 4 (GAUZE/BANDAGES/DRESSINGS) IMPLANT
CANISTER SUCT 1200ML W/VALVE (MISCELLANEOUS) ×4 IMPLANT
CHLORAPREP W/TINT 26 (MISCELLANEOUS) ×4 IMPLANT
CLIP APPLIE 11 MED OPEN (CLIP) IMPLANT
CLIP APPLIE 9.375 MED OPEN (MISCELLANEOUS) ×4 IMPLANT
COVER BACK TABLE 60X90IN (DRAPES) ×4 IMPLANT
COVER MAYO STAND STRL (DRAPES) ×4 IMPLANT
COVER PROBE CYLINDRICAL 5X96 (MISCELLANEOUS) ×4 IMPLANT
DERMABOND ADVANCED .7 DNX12 (GAUZE/BANDAGES/DRESSINGS) ×8 IMPLANT
DRAIN CHANNEL 15F RND FF W/TCR (WOUND CARE) IMPLANT
DRAIN CHANNEL 19F RND (DRAIN) ×4 IMPLANT
DRAPE INCISE IOBAN 66X45 STRL (DRAPES) IMPLANT
DRAPE LAPAROSCOPIC ABDOMINAL (DRAPES) ×4 IMPLANT
DRAPE TOP ARMCOVERS (MISCELLANEOUS) ×4 IMPLANT
DRAPE U-SHAPE 76X120 STRL (DRAPES) ×4 IMPLANT
DRAPE UTILITY XL STRL (DRAPES) ×4 IMPLANT
DRSG TEGADERM 2-3/8X2-3/4 SM (GAUZE/BANDAGES/DRESSINGS) IMPLANT
DRSG TEGADERM 4X10 (GAUZE/BANDAGES/DRESSINGS) ×4 IMPLANT
DURAPREP 26ML APPLICATOR (WOUND CARE) IMPLANT
ELECT BLADE 4.0 EZ CLEAN MEGAD (MISCELLANEOUS) ×4
ELECT COATED BLADE 2.86 ST (ELECTRODE) ×4 IMPLANT
ELECT REM PT RETURN 9FT ADLT (ELECTROSURGICAL) ×4
ELECTRODE BLDE 4.0 EZ CLN MEGD (MISCELLANEOUS) ×4 IMPLANT
ELECTRODE REM PT RTRN 9FT ADLT (ELECTROSURGICAL) ×4 IMPLANT
EVACUATOR SILICONE 100CC (DRAIN) ×4 IMPLANT
EXPANDER TISSUE FV FOURTE 400 (Prosthesis & Implant Plastic) IMPLANT
GAUZE PAD ABD 8X10 STRL (GAUZE/BANDAGES/DRESSINGS) ×8 IMPLANT
GAUZE SPONGE 4X4 12PLY STRL LF (GAUZE/BANDAGES/DRESSINGS) IMPLANT
GLOVE BIO SURGEON STRL SZ 6 (GLOVE) ×4 IMPLANT
GLOVE BIOGEL PI IND STRL 8 (GLOVE) ×4 IMPLANT
GLOVE ECLIPSE 8.0 STRL XLNG CF (GLOVE) ×4 IMPLANT
GOWN STRL REUS W/ TWL LRG LVL3 (GOWN DISPOSABLE) ×8 IMPLANT
GOWN STRL REUS W/ TWL XL LVL3 (GOWN DISPOSABLE) ×4 IMPLANT
LIGHT WAVEGUIDE WIDE FLAT (MISCELLANEOUS) IMPLANT
MARKER SKIN DUAL TIP RULER LAB (MISCELLANEOUS) IMPLANT
NDL HYPO 25X1 1.5 SAFETY (NEEDLE) ×8 IMPLANT
NDL SAFETY ECLIPSE 18X1.5 (NEEDLE) ×4 IMPLANT
NEEDLE HYPO 25X1 1.5 SAFETY (NEEDLE) ×4 IMPLANT
NS IRRIG 1000ML POUR BTL (IV SOLUTION) IMPLANT
PACK BASIN DAY SURGERY FS (CUSTOM PROCEDURE TRAY) ×4 IMPLANT
PENCIL SMOKE EVACUATOR (MISCELLANEOUS) ×4 IMPLANT
PIN SAFETY STERILE (MISCELLANEOUS) ×4 IMPLANT
PUNCH BIOPSY 4MM DISP (MISCELLANEOUS) IMPLANT
SHEET MEDIUM DRAPE 40X70 STRL (DRAPES) ×4 IMPLANT
SLEEVE SCD COMPRESS KNEE MED (STOCKING) ×4 IMPLANT
SPIKE FLUID TRANSFER (MISCELLANEOUS) IMPLANT
SPONGE T-LAP 18X18 ~~LOC~~+RFID (SPONGE) ×8 IMPLANT
SPONGE T-LAP 4X18 ~~LOC~~+RFID (SPONGE) IMPLANT
STAPLER SKIN PROX WIDE 3.9 (STAPLE) ×4 IMPLANT
STRIP CLOSURE SKIN 1/2X4 (GAUZE/BANDAGES/DRESSINGS) IMPLANT
SUT CHROMIC 3 0 SH 27 (SUTURE) IMPLANT
SUT CHROMIC 4 0 RB 1X27 (SUTURE) IMPLANT
SUT ETHILON 2 0 FS 18 (SUTURE) ×4 IMPLANT
SUT MNCRL AB 3-0 PS2 18 (SUTURE) ×4 IMPLANT
SUT MNCRL AB 4-0 PS2 18 (SUTURE) ×4 IMPLANT
SUT MON AB 4-0 PC3 18 (SUTURE) IMPLANT
SUT PDS AB 2-0 CT2 27 (SUTURE) IMPLANT
SUT SILK 2 0 PERMA HAND 18 BK (SUTURE) ×4 IMPLANT
SUT SILK 2 0 SH (SUTURE) IMPLANT
SUT VIC AB 3-0 54X BRD REEL (SUTURE) ×4 IMPLANT
SUT VIC AB 3-0 SH 27X BRD (SUTURE) IMPLANT
SUT VIC AB 4-0 PS2 18 (SUTURE) IMPLANT
SUT VICRYL 0 CT-2 (SUTURE) IMPLANT
SUT VICRYL 3-0 CR8 SH (SUTURE) ×8 IMPLANT
SUT VICRYL RAPIDE 4/0 PS 2 (SUTURE) IMPLANT
SUT VLOC 180 0 24IN GS25 (SUTURE) IMPLANT
SYR 10ML LL (SYRINGE) ×4 IMPLANT
SYR 50ML LL SCALE MARK (SYRINGE) ×4 IMPLANT
SYR BULB IRRIG 60ML STRL (SYRINGE) ×4 IMPLANT
SYR CONTROL 10ML LL (SYRINGE) ×8 IMPLANT
TAPE MEASURE VINYL STERILE (MISCELLANEOUS) IMPLANT
TISSUE EXPNDR FV FOURTE 400 (Prosthesis & Implant Plastic) ×8 IMPLANT
TOWEL GREEN STERILE FF (TOWEL DISPOSABLE) ×8 IMPLANT
TRACER MAGTRACE VIAL (MISCELLANEOUS) IMPLANT
TRAY DSU PREP LF (CUSTOM PROCEDURE TRAY) IMPLANT
TRAY FAXITRON CT DISP (TRAY / TRAY PROCEDURE) ×4 IMPLANT
TUBE CONNECTING 20X1/4 (TUBING) ×4 IMPLANT
UNDERPAD 30X36 HEAVY ABSORB (UNDERPADS AND DIAPERS) ×8 IMPLANT
YANKAUER SUCT BULB TIP NO VENT (SUCTIONS) ×4 IMPLANT

## 2023-04-02 NOTE — Interval H&P Note (Signed)
 History and Physical Interval Note:  04/02/2023 11:33 AM  Kristina King Billing  has presented today for surgery, with the diagnosis of DUCTAL CARCINOMA OF RIGHT BREAST.  The various methods of treatment have been discussed with the patient and family. After consideration of risks, benefits and other options for treatment, the patient has consented to  bilateral breast reconstruction with tissue expanders acellular dermis as a surgical intervention.  The patient's history has been reviewed, patient examined, no change in status, stable for surgery.  I have reviewed the patient's chart and labs.  Questions were answered to the patient's satisfaction.     [name]

## 2023-04-02 NOTE — Progress Notes (Signed)
Assisted Dr. Stephannie Peters with left, right, pectoralis, ultrasound guided block. Side rails up, monitors on throughout procedure. See vital signs in flow sheet. Tolerated Procedure well.

## 2023-04-02 NOTE — Op Note (Signed)
 Operative Note   DATE OF OPERATION: 1.3.2025  LOCATION: [facility]-observation  SURGICAL DIVISION: Plastic Surgery  PREOPERATIVE DIAGNOSES:  1. Right breast DCIS with microinvasion of invasive ductal carcinoma upper outer quadrant 2. CHEK2 mutation  POSTOPERATIVE DIAGNOSES:  same  PROCEDURE:  1. Bilateral breast reconstruction with tissue expanders 2. Acellular dermis (Alloderm) to bilateral chest 600 cm2  SURGEON: Earlis Ranks MD MBA  ASSISTANT: none  ANESTHESIA:  General.   EBL: 200 ml for entire procedure  COMPLICATIONS: None immediate.   INDICATIONS FOR PROCEDURE:  The patient, Kristina King, is a 48 y.o. female born on 12-02-1975, is here for immediate prepectoral tissue expander based reconstruction following nipple sparing mastectomies. Patient has a history of submuscular saline augmentation.   FINDINGS: Removed intact smooth round saline implants bilateral. Natrelle 133S FV-12-T 400 ml tissue expanders placed bilateral initial fill volume 150 ml air bilateral. RIGHT SN [id number] LEFT SN [id number]  DESCRIPTION OF PROCEDURE:  The patient was marked with the patient in the preoperative area to mark sternal notch, chest midline, anterior axillary lines and inframammary folds. Following induction, Foley catheter placed. The patient's operative site was prepped and draped in a sterile fashion. A time out was performed and all information was confirmed to be correct. I assisted in mastectomies with exposure and retraction. Following completion of mastectomies, reconstruction began on the left side. Hemostasis ensured. Cavity irrigated with saline solution containing Ancef , gentamicin , and Betadine . The pectoralis muscle was inset to chest wall with interrupted 2-0 PDS suture in figure of eight fashion. A 19 Fr drain was placed in subcutaneous position laterally and a 15 Fr drain placed along inframammary fold. Each secured to skin with 2-0 nylon. The tissue expanders were  prepared on back table prior in insertion. The expander was filled with air. Perforated acellular dermis was draped over anterior surface expander. The ADM was then secured to itself over posterior surface of expander with 4-0 chromic. Redundant folds acellular dermis excised so that the ADM lay flat without folds over air filled expander. The expander was secured to fascia over lateral sternal border with a 0 vicryl. The lateral tab was also secured to pectoralis muscle with 0-vicryl. The ADM was secured to pectoralis muscle and chest wall along inferior border at inframammary fold with 0 V-lock suture. Laterally the mastectomy flap over posterior axillary line was advanced anteriorly and the subcutaneous tissue and superficial fascia was secured to pectoralis and serratus muscles with 0-vicryl. Skin closure completed with 3-0 vicryl in fascial layer and 4-0 vicryl in dermis. Skin closure completed with 4-0 monocryl subcuticular.   I then directed my attention to right chest where similar irrigation and drain placement completed. The pectoralis muscle was inset to chest wall with interrupted 2-0 PDS suture in figure of eight fashion. The prepared expander with ADM secured over anterior surface was placed in right chest and tabs secured to chest wall and pectoralis muscle with 0- vicryl suture. The acellular dermis at inframammary fold was secured to chest wall with 0 V-lock suture. Laterally the mastectomy flap over posterior axillary line was advanced anteriorly and the subcutaneous tissue and superficial fascia was secured to pectoralis and serratus muscles with 0-vicryl. Skin closure completed with 3-0 vicryl in fascial layer and 4-0 vicryl in dermis. Skin closure completed with 4-0 monocryl subcuticular. Patient brought to upright sitting position, and the mastectomy flaps were redraped so that NAC was symmetric from sternal notch and chest midline. Dermabond applied over incisions. Tegaderm dressings applied  followed by  dry dressing, breast binder.   The patient was allowed to wake from anesthesia, extubated and taken to the recovery room in satisfactory condition.   SPECIMENS: none  DRAINS: 15 and 19 Fr JP in right and left subcutaneous chest  Earlis Ranks, MD [facility] Plastic & Reconstructive Surgery  Office/ physician access line after hours [phone]

## 2023-04-02 NOTE — H&P (Signed)
 istory of Present Illness: Kristina King is a 48 y.o. female who is seen today as an office consultation for evaluation of NEW BREAST CANCER  Pleasant 48 year old female who was found on high risk MRI screening to have an area density roughly 5 cm in maximal diameter as well as a 1 cm nodule. These are in the upper outer quadrants. Reviewing the MRI shows that disease disease extends down to the capsule of the right implant. Core biopsy of this area of density was DCIS intermediate grade. The 1 cm densities to be scheduled for biopsy this week. She presents today to discuss surgical options. She has the desire for bilateral mastectomies with reconstruction due to family history as well as her own high elevated risk.  Review of Systems: A complete review of systems was obtained from the patient. I have reviewed this information and discussed as appropriate with the patient. See HPI as well for other ROS.    Medical History: Past Medical History:  Diagnosis Date  Anxiety  Arthritis   There is no problem list on file for this patient.  Past Surgical History:  Procedure Laterality Date  Breast surgery  Elbow surgery  TONSILLECTOMY    No Known Allergies  Current Outpatient Medications on File Prior to Visit  Medication Sig Dispense Refill  ASHWAGANDHA EXTRACT ORAL Take by mouth  cholecalciferol (VITAMIN D3) 2,000 unit capsule Take 2 capsules by mouth once daily  CHROMIUM ORAL Take by mouth  GREEN TEA EXTRACT ORAL Take by mouth  loratadine (CLARITIN) 10 mg tablet Take 10 mg by mouth once daily  magnesium citrate oral solution Take 296 mLs by mouth once  meloxicam  (MOBIC ) 15 MG tablet Take 1 tablet daily with food for 5 days. Then take as needed.  pyridoxine, vitamin B6, (VITAMIN B-6) 50 MG tablet Take 50 mg by mouth once daily  UBRELVY  50 mg Tab   No current facility-administered medications on file prior to visit.   Family History  Problem Relation Age of Onset  Skin cancer  Mother  High blood pressure (Hypertension) Mother  Hyperlipidemia (Elevated cholesterol) Mother  Breast cancer Mother  High blood pressure (Hypertension) Father  Coronary Artery Disease (Blocked arteries around heart) Father    Social History   Tobacco Use  Smoking Status Never  Smokeless Tobacco Never    Social History   Socioeconomic History  Marital status: Married  Tobacco Use  Smoking status: Never  Smokeless tobacco: Never  Substance and Sexual Activity  Alcohol use: Never  Drug use: Never   Social Drivers of Health   Received from [employer]  Social Network   Objective:   Vitals:  03/15/23 1331  BP: 128/80  Pulse: 94  Temp: 36.8 C (98.3 F)  SpO2: 99%  Weight: 61.2 kg (135 lb)  Height: 163.8 cm (5' 4.5)  PainSc: 0-No pain  PainLoc: Breast   Body mass index is 22.81 kg/m.  Physical Exam Exam conducted with a chaperone present.  HENT:  Head: Normocephalic.  Cardiovascular:  Rate and Rhythm: Normal rate.  Pulmonary:  Effort: Pulmonary effort is normal.  Chest:  Comments: Bilateral implants in place. Bruising from biopsy noted. No mass. Musculoskeletal:  General: Normal range of motion.  Cervical back: Normal range of motion.  Lymphadenopathy:  Upper Body:  Right upper body: No axillary adenopathy.  Left upper body: No axillary adenopathy.  Skin: General: Skin is warm.  Neurological:  General: No focal deficit present.  Mental Status: She is alert.  Psychiatric:  Mood  and Affect: Mood normal.     Labs, Imaging and Diagnostic Testing: FINAL DIAGNOSIS  1. Breast, right, needle core biopsy, 10:00 5 cmfn : DUCTAL CARCINOMA IN SITU, INTERMEDIATE GRADE, CRIBRIFORM TYPES NECROSIS: PRESENT CALCIFICATIONS: NOT PRESENT DCIS LENGTH: 0.3 CM SEE NOTE  Diagnosis Note : Dr. Belvie reviewed the case and concurs with the interpretation. A breast prognostic profile (ER and PR) is pending and will be reported in an addendum. The breast  center of [facility] was notified on 03/12/2023.  DATE SIGNED OUT: 03/12/2023 ELECTRONIC SIGNATURE : Pepper Dutton Md, Pathologist, Electronic Signature  MICROSCOPIC DESCRIPTION  CASE COMMENTS STAINS USED IN DIAGNOSIS: H&E-2 H&E-3 H&E-4 H&E *RECUT 1 SLIDE Stains used in diagnosis 1 ER-ACIS, 1 PR-ACIS Estrogen receptor (6F11), immunohistochemical stains are performed on formalin fixed, paraffin embedded tissue using a 3,3-diaminobenzidine (DAB) chromogen and Leica Bond Autostainer System. The staining intensity of the nucleus is scored manually and is reported as the percentage of tumor cell nuclei demonstrating specific nuclear staining.Specimens are fixed in 10% Neutral Buffered Formalin for at least 6 hours and up to 72 hours. These tests have not be validated on decalcified tissue. Results should be interpreted with caution given the possibility of false negative results on decalcified specimens. PR progesterone receptor (16), immunohistochemical stains are performed on formalin fixed, paraffin embedded tissue using a 3,3-diaminobenzidine (DAB) chromogen and Leica Bond Autostainer System. The staining intensity of the nucleus is scored manually and is reported as the percentage of tumor cell nuclei demonstrating specific nuclear staining.Specimens are fixed in 10% Neutral Buffered Formalin for at least 6 hours and up to 72 hours. These tests have not be validated on decalcified tissue. Results should be interpreted with caution given the possibility of false negative results on decalcified specimens.  ADDENDUM Breast, right, needle core biopsy, 10:00 5 cmfn PROGNOSTIC INDICATORS  Results: IMMUNOHISTOCHEMICAL AND MORPHOMETRIC ANALYSIS PERFORMED MANUALLY Estrogen Receptor: 90%, POSITIVE, STRONG STAINING INTENSITY Progesterone Receptor: 90%, POSITIVE, STRONG STAINING INTENSITY REFERENCE RANGE ESTROGEN RECEPTOR NEGATIVE 0% POSITIVE =>1% REFERENCE RANGE PROGESTERONE  RECEPTOR NEGATIVE 0% POSITIVE =>1% All controls stained appropriately Stuart Come, [place]haoli, Sports Administrator, International Aid/development Worker ( Signed [phone] 2024)  CLINICAL HISTORY  SPECIMEN(S) OBTAINED 1. Breast, right, needle core biopsy, 10:00 5 Cmfn  SPECIMEN COMMENTS: 1. TIF: 4:15 pm, CIT: < 1 min, mass like area SPECIMEN CLINICAL INFORMATION: 1. 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 In formalin labeled [place], Madeliene and Rt Br 10 o'clk 5cmfn (TIF 1615 CIT <80min) are 3 cores of gray-white to yellow-red soft to firm tissue which range from 0.8 x 0.1 x 0.1 cm to 1.3 x 0.1 x 0.1 cm, submitted in 1 block. (SW 03/11/2023)  Report signed out from the following location(s) [place] [address] [place], [place] [address] CLIA #: [id number]  [facility] [address] [place], [place] [address] CLIA #: [id number]   CLINICAL DATA: 48 year old female presents for high risk screening breast MRI.  EXAM: BILATERAL BREAST MRI WITH AND WITHOUT CONTRAST  TECHNIQUE: Multiplanar, multisequence MR images of both breasts were obtained prior to and following the intravenous administration of 6 ml of Vueway   Three-dimensional MR images were rendered by post-processing of the original MR data on an independent workstation. The three-dimensional MR images were interpreted, and findings are reported in the following complete MRI report for this study. Three dimensional images were evaluated at the independent interpreting workstation using the DynaCAD thin client.  COMPARISON: Prior mammograms  FINDINGS: Breast composition: d. Extreme fibroglandular tissue.  Background parenchymal enhancement: Mild  Right breast: A 0.6 x 1.1 cm enhancing mass within the anterior OUTER RIGHT breast is identified (image 6: Series 12).  A 4 x 1.5 x 1.5 cm (AP x transverse x CC) area of non  masslike enhancement is identified within the posterior to middle depth UPPER-OUTER RIGHT breast (50-70: 12).  No other suspicious mass or worrisome enhancement noted.  A retropectoral implant is noted.  Left breast: No suspicious mass or worrisome enhancement. A retropectoral implant is noted.  Lymph nodes: No abnormal appearing lymph nodes.  Ancillary findings: None.  IMPRESSION: 1. 1.1 cm anterior OUTER RIGHT breast mass. 2nd-look ultrasound and possible biopsy recommended. 2. Indeterminate 4 x 1.5 x 1.5 cm UPPER-OUTER RIGHT breast non masslike enhancement. 2nd-look ultrasound recommended but this area may be difficult to visualize sonographically. 3. No suspicious MR findings within the LEFT breast. No abnormal appearing axillary lymph nodes.  RECOMMENDATION: 2nd-look RIGHT breast ultrasound and possible biopsies.  BI-RADS CATEGORY 4: Suspicious.   Electronically Signed By: Reyes Phi M.D. On: 03/08/2023 14:59  Assessment and Plan:   Diagnoses and all orders for this visit:  Ductal carcinoma in situ (DCIS) of right breast - Ambulatory Referral to Plastic Surgery   Reviewed options of treatment today. She does have implants that are retropectoral. She does want reconstruction. I think she would be a good candidate for a nipple sparing mastectomy and either expansion or replacement of her implants depending on plastic surgery review of this. She wishes to do both sides with the left being risk reducing due to lifetime risk well over 35 to 40%. She does not want radiation therapy nor breast conserving surgery. Refer to plastic surgery for evaluation of implant based reconstruction and options for that. Plan will be for bilateral nipple sparing mastectomies with the injection of mag trace for potential sentinel lymph node mapping if needed. This was all discussed with the patient and her husband today at great length. Risk of surgery reviewed with the patient specially  cosmetic outcome. Risk of bleeding, infection, nipple loss, skin loss, implant loss, injury to major blood vessels and nerves, change in function or weakness requiring physical therapy and lymphedema are all reviewed today.   DEBBY CURTISTINE SHIPPER, MD

## 2023-04-02 NOTE — Anesthesia Procedure Notes (Signed)
 Anesthesia Regional Block: Pectoralis block   Pre-Anesthetic Checklist: , timeout performed,  Correct Patient, Correct Site, Correct Laterality,  Correct Procedure, Correct Position, site marked,  Risks and benefits discussed,  Pre-op evaluation,  At surgeon's request and post-op pain management  Laterality: Right  Prep: Maximum Sterile Barrier Precautions used, chloraprep       Needles:  Injection technique: Single-shot  Needle Type: Echogenic Stimulator Needle     Needle Length: 9cm  Needle Gauge: 22     Additional Needles:   Procedures:,,,, ultrasound used (permanent image in chart),,    Narrative:  Start time: 04/02/2023 12:06 PM End time: 04/02/2023 12:07 PM Injection made incrementally with aspirations every 5 mL.  Performed by: Personally  Anesthesiologist: Paul Lamarr BRAVO, MD  Additional Notes: Risks, benefits, and alternative discussed. Patient gave consent for procedure. Patient prepped and draped in sterile fashion. Sedation administered, patient remains easily responsive to voice. Relevant anatomy identified with ultrasound guidance. Local anesthetic given in 5cc increments with no signs or symptoms of intravascular injection. No pain or paraesthesias with injection. Patient monitored throughout procedure with signs of LAST or immediate complications. Tolerated well. Ultrasound image placed in chart.  LANEY Paul, MD

## 2023-04-02 NOTE — Anesthesia Procedure Notes (Signed)
 Anesthesia Regional Block: Pectoralis block   Pre-Anesthetic Checklist: , timeout performed,  Correct Patient, Correct Site, Correct Laterality,  Correct Procedure, Correct Position, site marked,  Risks and benefits discussed,  Pre-op evaluation,  At surgeon's request and post-op pain management  Laterality: Left  Prep: Maximum Sterile Barrier Precautions used, chloraprep       Needles:  Injection technique: Single-shot  Needle Type: Echogenic Stimulator Needle     Needle Length: 9cm  Needle Gauge: 22     Additional Needles:   Procedures:,,,, ultrasound used (permanent image in chart),,    Narrative:  Start time: 04/02/2023 12:04 PM End time: 04/02/2023 12:06 PM Injection made incrementally with aspirations every 5 mL.  Performed by: Personally  Anesthesiologist: Paul Lamarr BRAVO, MD  Additional Notes: Risks, benefits, and alternative discussed. Patient gave consent for procedure. Patient prepped and draped in sterile fashion. Sedation administered, patient remains easily responsive to voice. Relevant anatomy identified with ultrasound guidance. Local anesthetic given in 5cc increments with no signs or symptoms of intravascular injection. No pain or paraesthesias with injection. Patient monitored throughout procedure with signs of LAST or immediate complications. Tolerated well. Ultrasound image placed in chart.  LANEY Paul, MD

## 2023-04-02 NOTE — Anesthesia Procedure Notes (Signed)
 Procedure Name: Intubation Date/Time: 04/02/2023 12:41 PM  Performed by: Denton Niels CROME, CRNAPre-anesthesia Checklist: Patient identified, Emergency Drugs available, Suction available and Patient being monitored Patient Re-evaluated:Patient Re-evaluated prior to induction Oxygen Delivery Method: Circle system utilized Preoxygenation: Pre-oxygenation with 100% oxygen Induction Type: IV induction Ventilation: Mask ventilation without difficulty Grade View: Grade II Tube type: Oral Tube size: 6.5 mm Number of attempts: 1 Airway Equipment and Method: Stylet and Oral airway Placement Confirmation: ETT inserted through vocal cords under direct vision, positive ETCO2 and breath sounds checked- equal and bilateral Secured at: 22 cm Tube secured with: Tape Dental Injury: Teeth and Oropharynx as per pre-operative assessment

## 2023-04-02 NOTE — Anesthesia Preprocedure Evaluation (Addendum)
 Anesthesia Evaluation  Patient identified by MRN, date of birth, ID band Patient awake    Reviewed: Allergy & Precautions, NPO status , Patient's Chart, lab work & pertinent test results  History of Anesthesia Complications Negative for: history of anesthetic complications  Airway Mallampati: II  TM Distance: >3 FB Neck ROM: Full    Dental no notable dental hx.    Pulmonary former smoker   Pulmonary exam normal        Cardiovascular negative cardio ROS Normal cardiovascular exam     Neuro/Psych  Headaches  Anxiety Depression       GI/Hepatic Neg liver ROS,GERD  Controlled,,  Endo/Other  negative endocrine ROS    Renal/GU negative Renal ROS  negative genitourinary   Musculoskeletal negative musculoskeletal ROS (+)    Abdominal   Peds  Hematology negative hematology ROS (+)   Anesthesia Other Findings DUCTAL CARCINOMA OF RIGHT BREAST  Reproductive/Obstetrics negative OB ROS                             Anesthesia Physical Anesthesia Plan  ASA: 2  Anesthesia Plan: General   Post-op Pain Management: Tylenol  PO (pre-op)* and Regional block*   Induction: Intravenous  PONV Risk Score and Plan: 3 and Treatment may vary due to age or medical condition, Ondansetron , Dexamethasone , Midazolam  and Scopolamine  patch - Pre-op  Airway Management Planned: Oral ETT  Additional Equipment: None  Intra-op Plan:   Post-operative Plan: Extubation in OR  Informed Consent: I have reviewed the patients History and Physical, chart, labs and discussed the procedure including the risks, benefits and alternatives for the proposed anesthesia with the patient or authorized representative who has indicated his/her understanding and acceptance.     Dental advisory given  Plan Discussed with: CRNA  Anesthesia Plan Comments:        Anesthesia Quick Evaluation

## 2023-04-02 NOTE — Interval H&P Note (Signed)
 History and Physical Interval Note:  04/02/2023 12:09 PM  Kristina King Billing  has presented today for surgery, with the diagnosis of DUCTAL CARCINOMA OF RIGHT BREAST.  The various methods of treatment have been discussed with the patient and family. After consideration of risks, benefits and other options for treatment, the patient has consented to  Procedure(s): RIGHT NIPPLE SPARING MASTECTOMY (Right) LEFT RISK REDUCING MASTECTOMY (Left) SENTINEL LYMPH NODE MAPPING (Right) BREAST RECONSTRUCTION WITH PLACEMENT OF TISSUE EXPANDER AND ALLODERM POSSIBLE SILICONE IMPLANT (N/A) as a surgical intervention.  The patient's history has been reviewed, patient examined, no change in status, stable for surgery.  I have reviewed the patient's chart and labs.  Questions were answered to the patient's satisfaction.     Delorese Sellin A Marjie Chea

## 2023-04-02 NOTE — Transfer of Care (Signed)
 Immediate Anesthesia Transfer of Care Note  Patient: Kristina King  Procedure(s) Performed: RIGHT NIPPLE SPARING MASTECTOMY (Right: Breast) LEFT RISK REDUCING MASTECTOMY (Left: Breast) SENTINEL LYMPH NODE MAPPING (Right: Axilla) BREAST RECONSTRUCTION WITH PLACEMENT OF TISSUE EXPANDER AND ALLODERM POSSIBLE SILICONE IMPLANT (Bilateral: Breast)  Patient Location: PACU  Anesthesia Type:GA combined with regional for post-op pain  Level of Consciousness: drowsy, patient cooperative, and responds to stimulation  Airway & Oxygen Therapy: Patient Spontanous Breathing and Patient connected to face mask oxygen  Post-op Assessment: Report given to RN and Post -op Vital signs reviewed and stable  Post vital signs: Reviewed and stable  Last Vitals:  Vitals Value Taken Time  BP    Temp    Pulse    Resp    SpO2      Last Pain:  Vitals:   04/02/23 1109  TempSrc: Oral  PainSc: 0-No pain      Patients Stated Pain Goal: 6 (04/02/23 1109)  Complications: No notable events documented.

## 2023-04-02 NOTE — Op Note (Signed)
 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 Arelia MD   Specimen: Bilateral breast oriented with suture and 1 right deep axillary sentinel node The surgical and non surgical options have been discussed with the patient.  Risks of surgery include bleeding,  Infection,  Flap necrosis,  Tissue loss,  Chronic pain, death, Numbness,  And the need for additional procedures.  Reconstruction options also have been discussed with the patient as well.  The patient agrees to proceed.   EBL: 50 cc  Indications for procedure: The patient is a 48 year old female with a family history of breast cancer and high risk state for developing breast cancer who on screening MRI was found to have an area of density in the right breast core biopsy proven to DCIS.  There is a second area as well biopsied which showed microinvasion.  She had opted for bilateral nipple preserving mastectomies.  She had previous implants in place was seen by plastic surgery evaluated.  She wished to proceed with breast reconstruction with nipple preserving bilateral mastectomies and right axillary sentinel lymph node mapping due to microinvasion.The surgical and non surgical options have been discussed with the patient.  Risks of surgery include bleeding,  Infection,  Flap necrosis,  Tissue loss,  Chronic pain, death, Numbness,  And the need for additional procedures.  Reconstruction options also have been discussed with the patient as well.  The patient agrees to proceed. Sentinel lymph node mapping and dissection has been discussed with the patient.  Risk of bleeding,  Infection,   Seroma formation,  Additional procedures,,  Shoulder weakness ,  Shoulder stiffness,  Nerve and blood vessel injury and reaction to the mapping dyes have been discussed.  Alternatives to surgery have been discussed with the patient.  The patient agrees to proceed.       Description of procedure: The patient was met in the holding area and questions were answered.  She underwent bilateral pectoral blocks per anesthesia.  She was then taken to the operating room.  She was placed supine upon the operative room table.  After induction of general anesthesia, the right breast was prepped sterilely with alcohol and 2 cc of mag trace were injected in a subareolar position.  This was massaged.  Both breasts were then prepped and draped in a sterile fashion.  A second timeout was performed.  The left breast was first addressed.  The previous scar was marked.  This was extended with the assistance of the plastic surgeon for standpoint symmetry.  Dissection was carried down to where the implant was easily visible below the left pectoralis muscle and the implant was removed.  We then dissected the skin off the anterior portion of the breast tissue circumferentially from the inframammary fold up to the clavicle, laterally to the axilla and then medially to the midline.  We then identified the pectoralis muscle.  A plane was developed on top of this between the capsule and the pectoralis muscle.  The capsule was removed.  This was taken in a similar fashion.  Once the superior attachment of the breast were encountered these were divided.  The breast then was oriented with sutures and passed off the field.  The left nipple was  biopsied and was found to be benign.    The mag trace probe was used to identify a lymph node with increased uptake in the right axilla.  Incision was made in the right axilla.  Dissection was carried down into the deep level 1 contents.  A single node was identified as a sentinel node and this was  removed.  Hemostasis was achieved.  Deep tissue planes were approximated with 3-0 Vicryl.  4 Monocryl was used to close the skin.  In a similar fashion the right mastectomy was performed.  The previous breast augmentation scar was noted on the right.  Incision was made through this down to the implant.  The implant was removed.  The anterior skin flap was developed in a sharp fashion.  This was taken over to the nipple.  The nipple was then biopsied and sent for frozen section was benign.  We continued the dissection up toward the superior pole of the breast as well as the axilla and over toward the midline anteriorly preserving the skin as well as the nipple areolar complex.  We then identified the right pectoralis muscle a plane was developed between the implant and the right muscle to include the capsule.  The capsule was removed over the lateral portion which corresponded to where the implant had migrated inferolaterally as expected.  This capsule was taken with the breast tissue which would be the capsule noted on the MRI on the right.  The area of disease had a large hematoma we were careful not to disrupt that.  Once the entire right breast was dissected out and removed we placed this for intraoperative imaging which showed both clips to be in the specimen from previous biopsies.  Hemostasis achieved in both sides with cautery.  At this point in time, plastic surgery took over the case to complete the reconstruction portion.  Please see their note.  The patient was hemodynamically stable.  All counts were found to be correct.

## 2023-04-03 DIAGNOSIS — C50411 Malignant neoplasm of upper-outer quadrant of right female breast: Secondary | ICD-10-CM | POA: Diagnosis not present

## 2023-04-03 MED ORDER — CEFAZOLIN SODIUM-DEXTROSE 2-4 GM/100ML-% IV SOLN
INTRAVENOUS | Status: AC
  Filled 2023-04-03: qty 100

## 2023-04-03 NOTE — Discharge Summary (Signed)
 Physician Discharge Summary  Patient ID: Kristina King MRN: [id number] DOB/AGE: [birth year] 48 y.o.  Admit date: 04/02/2023 Discharge date: 04/03/2023  Admission Diagnoses: Right breast cancer CHEK 2  Discharge Diagnoses:  same  Discharged Condition: stable  Hospital Course: Post operatively patient tolerated diet pain controlled and was ambulatory with minimal assist. Patient instructed on drain care and bathing.  Treatments: surgery: bilateral nipple sparing mastectomies right sentinel node bilateral tissue expander acellular dermis reconstruction 1.3.2025  Discharge Exam: Blood pressure 105/79, pulse 60, temperature 97.9 F (36.6 C), resp. rate 16, height 5' 4.5 (1.638 m), weight 57.7 kg, last menstrual period 03/31/2023, SpO2 100%. Incision/Wound: incisions dry intact Tegaderms in place no hematoma echhymoses skin flaps drains serosanguinous  Disposition: Discharge disposition: 01-Home or Self Care       Discharge Instructions     Call MD for:  redness, tenderness, or signs of infection (pain, swelling, bleeding, redness, odor or green/yellow discharge around incision site)   Complete by: As directed    Call MD for:  temperature >100.5   Complete by: As directed    Discharge instructions   Complete by: As directed    Ok to remove dressings and shower am 1.5.25. Soap and water ok, pat Tegaderms dry. Do not remove Tegaderms. No creams or ointments over incisions. Do not let drains dangle in shower, attach to lanyard or similar. Strip and record drains twice daily and bring log to clinic visit.  Breast binder or soft compression bra all other times.  Ok to raise arms above shoulders for bathing and dressing.  No house yard work or exercise until cleared by MD.   Recommend ibuprofen with meals to aid with pain control. Also ok to use Tylenol  for pain. Patient received all Rx preop. Recommend Miralax or Dulcolax as needed for constipation.   Driving Restrictions   Complete  by: As directed    No driving for 2 weeks then no driving if taking prescription pain medication   Lifting restrictions   Complete by: As directed    No lifting > 5-10 lbs until cleared by MD   Resume previous diet   Complete by: As directed       Allergies as of 04/03/2023       Reactions   Chlorhexidine Itching, Rash        Medication List     TAKE these medications    Ashwagandha 300 MG Tabs Take 600 mg by mouth daily at 12 noon.   loratadine 10 MG tablet Commonly known as: CLARITIN Take 10 mg by mouth daily.   magnesium 30 MG tablet Take 150 mg by mouth 2 (two) times daily.   meloxicam  15 MG tablet Commonly known as: MOBIC  Take 1 tablet daily with food for 5 days. Then take as needed.   methocarbamol  500 MG tablet Commonly known as: ROBAXIN  Take 1 tablet (500 mg total) by mouth every 8 (eight) hours as needed for muscle spasms.   OVER THE COUNTER MEDICATION Take 1 tablet by mouth daily. Chromium 500 mcg, bifidobacterium 25 mg, green tea extract 895 mg (blended together tablet)   OVER THE COUNTER MEDICATION Take 1 Scoop by mouth daily. Collagen 11.5 g with tryptophan 140 mg   Ubrelvy  100 MG Tabs Generic drug: Ubrogepant  Take 1 tablet (100 mg total) by mouth daily as needed (migraine.  May repeat in 2 hrs if needed).   VITAMIN B-6 PO Take by mouth.   vitamin D3 50 MCG (2000 UT) Caps Take 2 capsules  by mouth daily.        Follow-up Information     Arelia Filippo, MD Follow up in 1 week(s).   Specialty: Plastic Surgery Why: as scheduled Contact information: [address] [place] [address] [phone]         Vanderbilt Ned, MD Follow up on 04/16/2023.   Specialty: General Surgery Why: as scheduled Contact information: [address] [place] [address] [phone]                 Signed: Filippo Arelia 04/03/2023, 8:04 AM

## 2023-04-03 NOTE — Anesthesia Postprocedure Evaluation (Signed)
 Anesthesia Post Note  Patient: Kristina King  Procedure(s) Performed: RIGHT NIPPLE SPARING MASTECTOMY (Right: Breast) LEFT RISK REDUCING MASTECTOMY (Left: Breast) SENTINEL LYMPH NODE MAPPING (Right: Axilla) BREAST RECONSTRUCTION WITH PLACEMENT OF TISSUE EXPANDER AND ALLODERM POSSIBLE SILICONE IMPLANT (Bilateral: Breast)     Patient location during evaluation: PACU Anesthesia Type: General Level of consciousness: awake and alert Pain management: pain level controlled Vital Signs Assessment: post-procedure vital signs reviewed and stable Respiratory status: spontaneous breathing, nonlabored ventilation, respiratory function stable and patient connected to nasal cannula oxygen Cardiovascular status: blood pressure returned to baseline and stable Postop Assessment: no apparent nausea or vomiting Anesthetic complications: no   No notable events documented.  Last Vitals:    Last Pain:                 Garnette FORBES Skillern

## 2023-04-05 ENCOUNTER — Encounter (HOSPITAL_BASED_OUTPATIENT_CLINIC_OR_DEPARTMENT_OTHER): Payer: Self-pay | Admitting: Surgery

## 2023-04-05 ENCOUNTER — Encounter: Payer: Self-pay | Admitting: Surgery

## 2023-04-05 ENCOUNTER — Emergency Department (HOSPITAL_BASED_OUTPATIENT_CLINIC_OR_DEPARTMENT_OTHER)
Admission: EM | Admit: 2023-04-05 | Discharge: 2023-04-06 | Disposition: A | Discharge status: Home / Self Care | Payer: Managed Care, Other (non HMO) | Attending: Emergency Medicine | Admitting: Emergency Medicine

## 2023-04-05 ENCOUNTER — Other Ambulatory Visit: Payer: Self-pay

## 2023-04-05 ENCOUNTER — Emergency Department (HOSPITAL_BASED_OUTPATIENT_CLINIC_OR_DEPARTMENT_OTHER): Payer: Managed Care, Other (non HMO)

## 2023-04-05 DIAGNOSIS — I2699 Other pulmonary embolism without acute cor pulmonale: Secondary | ICD-10-CM | POA: Diagnosis present

## 2023-04-05 DIAGNOSIS — D649 Anemia, unspecified: Secondary | ICD-10-CM | POA: Diagnosis not present

## 2023-04-05 DIAGNOSIS — Z7901 Long term (current) use of anticoagulants: Secondary | ICD-10-CM | POA: Diagnosis not present

## 2023-04-05 DIAGNOSIS — Z20822 Contact with and (suspected) exposure to covid-19: Secondary | ICD-10-CM | POA: Insufficient documentation

## 2023-04-05 DIAGNOSIS — R0602 Shortness of breath: Secondary | ICD-10-CM | POA: Diagnosis present

## 2023-04-05 DIAGNOSIS — R5381 Other malaise: Secondary | ICD-10-CM

## 2023-04-05 DIAGNOSIS — Z853 Personal history of malignant neoplasm of breast: Secondary | ICD-10-CM | POA: Insufficient documentation

## 2023-04-05 LAB — BASIC METABOLIC PANEL
Anion gap: 8 (ref 5–15)
BUN: 16 mg/dL (ref 6–20)
CO2: 28 mmol/L (ref 22–32)
Calcium: 8.8 mg/dL — ABNORMAL LOW (ref 8.9–10.3)
Chloride: 100 mmol/L (ref 98–111)
Creatinine, Ser: 0.75 mg/dL (ref 0.44–1.00)
GFR, Estimated: >60 mL/min (ref >60)
Glucose, Bld: 146 mg/dL — ABNORMAL HIGH (ref 70–99)
Potassium: 3.5 mmol/L (ref 3.5–5.1)
Sodium: 136 mmol/L (ref 135–145)

## 2023-04-05 LAB — CBC WITH DIFFERENTIAL/PLATELET
Abs Immature Granulocytes: 0.03 10*3/uL (ref 0.00–0.07)
Basophils Absolute: 0 10*3/uL (ref 0.0–0.1)
Basophils Relative: 0 %
Eosinophils Absolute: 0 10*3/uL (ref 0.0–0.5)
Eosinophils Relative: 0 %
HCT: 33.1 % — ABNORMAL LOW (ref 36.0–46.0)
Hemoglobin: 11.4 g/dL — ABNORMAL LOW (ref 12.0–15.0)
Immature Granulocytes: 0 %
Lymphocytes Relative: 9 %
Lymphs Abs: 0.8 10*3/uL (ref 0.7–4.0)
MCH: 30.9 pg (ref 26.0–34.0)
MCHC: 34.4 g/dL (ref 30.0–36.0)
MCV: 89.7 fL (ref 80.0–100.0)
Monocytes Absolute: 0.5 10*3/uL (ref 0.1–1.0)
Monocytes Relative: 5 %
Neutro Abs: 7.7 10*3/uL (ref 1.7–7.7)
Neutrophils Relative %: 86 %
Platelets: 157 10*3/uL (ref 150–400)
RBC: 3.69 MIL/uL — ABNORMAL LOW (ref 3.87–5.11)
RDW: 13.1 % (ref 11.5–15.5)
WBC: 9 10*3/uL (ref 4.0–10.5)
nRBC: 0 % (ref 0.0–0.2)

## 2023-04-05 LAB — RESP PANEL BY RT-PCR (RSV, FLU A&B, COVID)  RVPGX2
Influenza A by PCR: NEGATIVE
Influenza B by PCR: NEGATIVE
Resp Syncytial Virus by PCR: NEGATIVE
SARS Coronavirus 2 by RT PCR: NEGATIVE

## 2023-04-05 LAB — TROPONIN I (HIGH SENSITIVITY): Troponin I (High Sensitivity): 4 ng/L (ref <18)

## 2023-04-05 LAB — SURGICAL PATHOLOGY

## 2023-04-05 LAB — BRAIN NATRIURETIC PEPTIDE: B Natriuretic Peptide: 38.6 pg/mL (ref 0.0–100.0)

## 2023-04-05 LAB — HCG, SERUM, QUALITATIVE: Preg, Serum: NEGATIVE

## 2023-04-05 MED ORDER — METHOCARBAMOL 500 MG PO TABS: 500.0000 mg | ORAL_TABLET | Freq: Three times a day (TID) | ORAL | Status: DC | PRN

## 2023-04-05 MED ORDER — APIXABAN (ELIQUIS) EDUCATION KIT FOR DVT/PE PATIENTS: PACK | Freq: Once | Status: AC

## 2023-04-05 MED ORDER — LORAZEPAM 1 MG PO TABS
1.0000 mg | ORAL_TABLET | Freq: Once | ORAL | Status: AC
Start: 2023-04-05 — End: 2023-04-05
  Administered 2023-04-05: 1 mg via ORAL
  Filled 2023-04-05: qty 1

## 2023-04-05 MED ORDER — OXYCODONE-ACETAMINOPHEN 5-325 MG PO TABS
1.0000 | ORAL_TABLET | Freq: Once | ORAL | Status: AC
  Administered 2023-04-05: 1 via ORAL
  Filled 2023-04-05: qty 1

## 2023-04-05 MED ORDER — IOHEXOL 350 MG/ML SOLN
100.0000 mL | Freq: Once | INTRAVENOUS | Status: AC | PRN
  Administered 2023-04-05: 75 mL via INTRAVENOUS

## 2023-04-05 MED ORDER — APIXABAN 2.5 MG PO TABS
10.0000 mg | ORAL_TABLET | Freq: Once | ORAL | Status: AC
  Administered 2023-04-05: 10 mg via ORAL
  Filled 2023-04-05: qty 4

## 2023-04-05 MED ORDER — APIXABAN (ELIQUIS) VTE STARTER PACK (10MG AND 5MG): ORAL_TABLET | ORAL | 0 refills | Status: DC

## 2023-04-05 NOTE — ED Notes (Signed)
 Pt took own robaxin 500

## 2023-04-05 NOTE — ED Provider Notes (Addendum)
 [place] EMERGENCY DEPARTMENT AT [facility] Provider Note   CSN: [id number] Arrival date & time: 04/05/23  1437     History  Chief Complaint  Patient presents with   Post-op Problem    Kristina King is a 48 y.o. female.  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  The patient states that she underwent breast reconstructive surgery on Friday 1/3.  She has 4 drains in place, 2 on each side bilaterally that up and putting out serous fluid.  Today she developed shortness of breath.  She denies any chest discomfort.  No fevers, chills or cough.  She denies any history of DVT or PE.   Home Medications Prior to Admission medications   Medication Sig Start Date End Date Taking? Authorizing Provider  APIXABAN  (ELIQUIS ) VTE STARTER PACK (10MG  AND 5MG ) Take as directed on package: start with two-5mg  tablets twice daily for 7 days. On day 8, switch to one-5mg  tablet twice daily. 04/05/23  Yes Jerrol Agent, MD  Ashwagandha 300 MG TABS Take 600 mg by mouth daily at 12 noon.    [provider]  loratadine (CLARITIN) 10 MG tablet Take 10 mg by mouth daily.    [provider]  magnesium 30 MG tablet Take 150 mg by mouth 2 (two) times daily.    [provider]  meloxicam  (MOBIC ) 15 MG tablet Take 1 tablet daily with food for 5 days. Then take as needed. 07/07/22   Arvell Evalene SAUNDERS, DO  methocarbamol  (ROBAXIN ) 500 MG tablet Take 1 tablet (500 mg total) by mouth every 8 (eight) hours as needed for muscle spasms. 11/23/22   Hudnall, Ludie SAUNDERS, MD  OVER THE COUNTER MEDICATION Take 1 tablet by mouth daily. Chromium 500 mcg, bifidobacterium 25 mg, green tea extract 895 mg (blended together tablet)    [provider]  OVER THE COUNTER MEDICATION Take 1 Scoop by mouth daily. Collagen 11.5 g with tryptophan 140  mg    [provider]  Pyridoxine HCl (VITAMIN B-6 PO) Take by mouth.    [provider]  Ubrogepant  (UBRELVY ) 100 MG TABS Take 1 tablet (100 mg total) by mouth daily as needed (migraine.  May repeat in 2 hrs if needed). 11/25/22   Mahlon Comer BRAVO, MD  Vitamin D , Cholecalciferol, 50 MCG (2000 UT) CAPS Take 2 capsules by mouth daily.    [provider]      Allergies    Chlorhexidine    Review of Systems   Review of Systems  Respiratory:  Positive for shortness of breath.   All other systems reviewed and are negative.   Physical Exam Updated Vital Signs BP 133/73   Pulse 80   Temp 98.6 F (37 C) (Oral)   Resp 20   LMP 03/31/2023 (Exact Date) Comment: UPT neg DOS  SpO2 99%  Physical Exam Vitals and nursing note reviewed.  Constitutional:      General: She is not in acute distress. HENT:     Head: Normocephalic and atraumatic.  Eyes:     Conjunctiva/sclera: Conjunctivae normal.     Pupils: Pupils are equal, round, and reactive to light.  Cardiovascular:     Rate and Rhythm: Normal rate and regular rhythm.     Heart sounds: Normal heart sounds. No murmur heard. Pulmonary:     Effort: Pulmonary effort is normal. No respiratory  distress.     Breath sounds: Normal breath sounds.  Chest:     Comments: Bilateral post op drains in place, 2 on each side draining bloody/serous fluid Abdominal:     General: There is no distension.     Tenderness: There is no guarding.  Musculoskeletal:        General: No deformity or signs of injury.     Cervical back: Neck supple.  Skin:    Findings: No lesion or rash.  Neurological:     General: No focal deficit present.     Mental Status: She is alert. Mental status is at baseline.     ED Results / Procedures / Treatments   Labs (all labs ordered are listed, but only abnormal results are displayed) Labs Reviewed  BASIC METABOLIC PANEL - Abnormal; Notable for the following components:      Result Value    Glucose, Bld 146 (*)    Calcium 8.8 (*)    All other components within normal limits  CBC WITH DIFFERENTIAL/PLATELET - Abnormal; Notable for the following components:   RBC 3.69 (*)    Hemoglobin 11.4 (*)    HCT 33.1 (*)    All other components within normal limits  RESP PANEL BY RT-PCR (RSV, FLU A&B, COVID)  RVPGX2  HCG, SERUM, QUALITATIVE  BRAIN NATRIURETIC PEPTIDE  TROPONIN I (HIGH SENSITIVITY)    EKG EKG Interpretation Date/Time:  Monday April 05 2023 14:59:13 EST Ventricular Rate:  84 PR Interval:  116 QRS Duration:  78 QT Interval:  338 QTC Calculation: 399 R Axis:   69  Text Interpretation: Normal sinus rhythm Anteroseptal infarct , age undetermined Abnormal ECG No previous ECGs available Confirmed by Jerrol Agent ([id number]) on 04/05/2023 3:02:57 PM  Radiology CT Angio Chest PE W and/or Wo Contrast Result Date: 04/05/2023 CLINICAL DATA:  Postop for bilateral mastectomy on Friday with weakness and shortness of breath. EXAM: CT ANGIOGRAPHY CHEST WITH CONTRAST TECHNIQUE: Multidetector CT imaging of the chest was performed using the standard protocol during bolus administration of intravenous contrast. Multiplanar CT image reconstructions and MIPs were obtained to evaluate the vascular anatomy. RADIATION DOSE REDUCTION: This exam was performed according to the departmental dose-optimization program which includes automated exposure control, adjustment of the mA and/or kV according to patient size and/or use of iterative reconstruction technique. CONTRAST:  75mL OMNIPAQUE  IOHEXOL  350 MG/ML SOLN COMPARISON:  11/17/2022 cardiac CTA. FINDINGS: Cardiovascular: The quality of this exam for evaluation of pulmonary embolism is moderate to good. Mild limitations secondary to patient arm position, not raised above the head. Lateral right lower lobe segmental to subsegmental pulmonary emboli including on images 133/5, 139/5. Subsegmental embolus within the lingula on 150/5. Normal aortic caliber.  Normal heart size, without pericardial effusion. Mediastinum/Nodes: Ill-defined right axillary gas is presumably postoperative. No axillary adenopathy. No mediastinal or hilar adenopathy. Lungs/Pleura: No pleural fluid. Dependent bibasilar atelectasis in both lower lobes. Upper Abdomen: Dependent 6 mm gallstone. Artifact continuing into the upper abdomen. Normal imaged portions of the liver, spleen, stomach, pancreas, adrenal glands, kidneys. Musculoskeletal: Soft tissue expanders in the chest wall status post mastectomy. No acute osseous abnormality. Review of the MIP images confirms the above findings. IMPRESSION: 1. Small volume bilateral pulmonary emboli. 2. Degraded exam secondary to patient arm position. 3. Cholelithiasis These results were called by telephone at the time of interpretation on 04/05/2023 at 6:07 pm to provider [facility] , who verbally acknowledged these results. Electronically Signed   By: Rockey Marthann HERO.D.  On: 04/05/2023 18:12    Procedures Procedures    Medications Ordered in ED Medications  oxyCODONE -acetaminophen  (PERCOCET/ROXICET) 5-325 MG per tablet 1 tablet (1 tablet Oral Not Given 04/05/23 1750)  iohexol  (OMNIPAQUE ) 350 MG/ML injection 100 mL (75 mLs Intravenous Contrast Given 04/05/23 1656)  apixaban  (ELIQUIS ) Education Kit for DVT/PE patients ( Does not apply Given 04/05/23 1858)  apixaban  (ELIQUIS ) tablet 10 mg (10 mg Oral Given 04/05/23 1856)    ED Course/ Medical Decision Making/ A&P                                 Medical Decision Making Amount and/or Complexity of Data Reviewed Labs: ordered. Radiology: ordered.  Risk Prescription drug management. Decision regarding hospitalization.    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  The patient states that she underwent breast reconstructive surgery on Friday  1/3.  She has 4 drains in place, 2 on each side bilaterally that up and putting out serous fluid.  Today she developed shortness of breath.  She denies any chest discomfort.  No fevers, chills or cough.  She denies any history of DVT or PE.   On arrival, the patient was vitally stable, afebrile, temperature nine 9.3, not tachycardic or tachypneic, BP 177/105, saturating 99% on room air.  Physical exam revealed clear lungs bilaterally.  An EKG was performed revealed sinus rhythm, ventricular rate 84, no acute ischemic changes.  Laboratory evaluation revealed COVID-19, influenza, RSV PCR testing negative, CBC without a leukocytosis, mild anemia 11.4 status post surgery, BNP normal, hCG negative, BMP without significant electrolyte abnormality.  A cardiac troponin was normal.  CTA PE: IMPRESSION:  1. Small volume bilateral pulmonary emboli.  2. Degraded exam secondary to patient arm position.  3. Cholelithiasis    These results were called by telephone at the time of interpretation  on 04/05/2023 at 6:07 pm to provider [facility] , who verbally  acknowledged these results.   Patient vitally stable, not hypoxic or tachycardic, hemodynamically stable, no evidence of right heart strain and normal BNP and cardiac troponins.  Given a single dose of Eliquis  in the emergency department and an Eliquis  starter pack was prescribed.  Patient advised outpatient follow-up with her primary care physician.  Prior to discharge, the patient endorsed persistent symptoms of dyspnea, anxiety related to her discharge and prescription medication.  An ambulatory pulse oximetry was performed, with no desaturations and no elevation in heart rate.  She did endorse profound weakness and fatigue and dyspnea. I believe some of this is due to her post operative state. She is requesting admission for observation. Discussed the care of the patient with hospitalist medicine, Dr. Franky who accepted the patient in admission for  observation..   Final Clinical Impression(s) / ED Diagnoses Final diagnoses:  Acute pulmonary embolism without acute cor pulmonale, unspecified pulmonary embolism type (HCC)    Rx / DC Orders ED Discharge Orders          Ordered    APIXABAN  (ELIQUIS ) VTE STARTER PACK (10MG  AND 5MG )       Note to Pharmacy: If starter pack unavailable, substitute with seventy-four 5 mg apixaban  tabs following the above SIG directions.   04/05/23 1813                  Jerrol Agent, MD 04/05/23 2125

## 2023-04-05 NOTE — ED Triage Notes (Signed)
 Post op double breast surgery on Friday  Now feeling weak and sob

## 2023-04-05 NOTE — Discharge Instructions (Addendum)
 You have evidence of small blood clots in your lungs but no evidence of damage to your heart or right heart strain with normal cardiac troponin.  Your CT scan and vitals was also reassuring pointing away from a need from admission.  Please take Eliquis  and follow-up with your surgeon and primary care provider.  Return for any severe worsening symptoms.

## 2023-04-06 ENCOUNTER — Other Ambulatory Visit (HOSPITAL_BASED_OUTPATIENT_CLINIC_OR_DEPARTMENT_OTHER): Payer: Self-pay

## 2023-04-06 MED ORDER — LORAZEPAM 1 MG PO TABS
1.0000 mg | ORAL_TABLET | Freq: Once | ORAL | Status: AC
  Administered 2023-04-06: 1 mg via ORAL
  Filled 2023-04-06: qty 1

## 2023-04-06 MED ORDER — APIXABAN (ELIQUIS) VTE STARTER PACK (10MG AND 5MG)
ORAL_TABLET | ORAL | 0 refills | Status: DC
  Filled 2023-04-06: qty 74, 28d supply, fill #0

## 2023-04-06 MED ORDER — METHOCARBAMOL 500 MG PO TABS
1000.0000 mg | ORAL_TABLET | Freq: Once | ORAL | Status: AC
Start: 2023-04-06 — End: 2023-04-06
  Administered 2023-04-06: 1000 mg via ORAL
  Filled 2023-04-06: qty 2

## 2023-04-06 MED ORDER — OXYCODONE HCL 5 MG PO TABS
5.0000 mg | ORAL_TABLET | Freq: Once | ORAL | Status: AC
  Administered 2023-04-06: 5 mg via ORAL
  Filled 2023-04-06: qty 1

## 2023-04-06 NOTE — ED Notes (Signed)
 Husband came out of room stating they were ready to go home on Eliquis and didn't want to be admitted after speaking with her surgeon. ED MD aware and at bedside with patient now.

## 2023-04-06 NOTE — ED Notes (Signed)
 Pt given discharge instructions and reviewed prescriptions. Opportunities given for questions. Pt verbalizes understanding. PIV removed x1. Jillyn Hidden, RN

## 2023-04-06 NOTE — ED Provider Notes (Signed)
 Assumed care of the patient at 0 700, briefly the patient is a 48 year old female with a history of breast cancer recently status post bilateral mastectomy here with chest pressure and difficulty breathing.  Found to have small bilateral PEs.  Patient was awaiting admission but she had talked with her neighbor who is a development worker, international aid and after some discussion she decided that she would rather go home.  I discussion with her at bedside.  Discussed risks and benefits of admission.  She would like to go home at this time.  Will follow-up with her oncologist and PCP.   Emil Share, DO 04/06/23 [phone]

## 2023-04-09 ENCOUNTER — Encounter: Payer: Self-pay | Admitting: *Deleted

## 2023-04-11 ENCOUNTER — Emergency Department (HOSPITAL_COMMUNITY): Payer: Managed Care, Other (non HMO)

## 2023-04-11 ENCOUNTER — Emergency Department (HOSPITAL_COMMUNITY)
Admission: EM | Admit: 2023-04-11 | Discharge: 2023-04-11 | Disposition: A | Discharge status: Home / Self Care | Payer: Managed Care, Other (non HMO) | Attending: Emergency Medicine | Admitting: Emergency Medicine

## 2023-04-11 ENCOUNTER — Other Ambulatory Visit: Payer: Self-pay

## 2023-04-11 ENCOUNTER — Encounter (HOSPITAL_COMMUNITY): Payer: Self-pay | Admitting: Emergency Medicine

## 2023-04-11 DIAGNOSIS — Z7901 Long term (current) use of anticoagulants: Secondary | ICD-10-CM | POA: Diagnosis not present

## 2023-04-11 DIAGNOSIS — H5702 Anisocoria: Secondary | ICD-10-CM | POA: Insufficient documentation

## 2023-04-11 DIAGNOSIS — H5789 Other specified disorders of eye and adnexa: Secondary | ICD-10-CM | POA: Diagnosis present

## 2023-04-11 DIAGNOSIS — Z853 Personal history of malignant neoplasm of breast: Secondary | ICD-10-CM | POA: Insufficient documentation

## 2023-04-11 DIAGNOSIS — R519 Headache, unspecified: Secondary | ICD-10-CM | POA: Diagnosis not present

## 2023-04-11 LAB — CBC WITH DIFFERENTIAL/PLATELET
Abs Immature Granulocytes: 0.06 10*3/uL (ref 0.00–0.07)
Basophils Absolute: 0 10*3/uL (ref 0.0–0.1)
Basophils Relative: 0 %
Eosinophils Absolute: 0.1 10*3/uL (ref 0.0–0.5)
Eosinophils Relative: 1 %
HCT: 36.8 % (ref 36.0–46.0)
Hemoglobin: 12.6 g/dL (ref 12.0–15.0)
Immature Granulocytes: 1 %
Lymphocytes Relative: 15 %
Lymphs Abs: 1.5 10*3/uL (ref 0.7–4.0)
MCH: 30.7 pg (ref 26.0–34.0)
MCHC: 34.2 g/dL (ref 30.0–36.0)
MCV: 89.5 fL (ref 80.0–100.0)
Monocytes Absolute: 0.6 10*3/uL (ref 0.1–1.0)
Monocytes Relative: 6 %
Neutro Abs: 7.4 10*3/uL (ref 1.7–7.7)
Neutrophils Relative %: 77 %
Platelets: 239 10*3/uL (ref 150–400)
RBC: 4.11 MIL/uL (ref 3.87–5.11)
RDW: 12.7 % (ref 11.5–15.5)
WBC: 9.6 10*3/uL (ref 4.0–10.5)
nRBC: 0 % (ref 0.0–0.2)

## 2023-04-11 LAB — BASIC METABOLIC PANEL
Anion gap: 11 (ref 5–15)
BUN: 15 mg/dL (ref 6–20)
CO2: 26 mmol/L (ref 22–32)
Calcium: 9.4 mg/dL (ref 8.9–10.3)
Chloride: 98 mmol/L (ref 98–111)
Creatinine, Ser: 0.66 mg/dL (ref 0.44–1.00)
GFR, Estimated: >60 mL/min (ref >60)
Glucose, Bld: 102 mg/dL — ABNORMAL HIGH (ref 70–99)
Potassium: 3.4 mmol/L — ABNORMAL LOW (ref 3.5–5.1)
Sodium: 135 mmol/L (ref 135–145)

## 2023-04-11 LAB — PROTIME-INR
INR: 1.1 (ref 0.8–1.2)
Prothrombin Time: 14 s (ref 11.4–15.2)

## 2023-04-11 MED ORDER — IOHEXOL 350 MG/ML SOLN
75.0000 mL | Freq: Once | INTRAVENOUS | Status: AC | PRN
  Administered 2023-04-11: 50 mL via INTRAVENOUS

## 2023-04-11 NOTE — Discharge Instructions (Addendum)
 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  CT scan today did not show any concerning finding however please call and follow-up closely with your eye specialist for thorough evaluation.  If you develop severe headache, focal numbness or weakness, or change in vision please return for reassessment.

## 2023-04-11 NOTE — ED Notes (Signed)
 Patient transported to CT

## 2023-04-11 NOTE — ED Triage Notes (Signed)
 Pt from home, her right pupil is dialiated to a 5.  Pt has no pain or vision changes other than the brightness.  She did have double breast surgery on Friday the 3rd and found small blood clots on 6th.  She is now on blood thinners

## 2023-04-11 NOTE — ED Provider Notes (Signed)
 [facility] Provider Note   CSN: [id number] Arrival date & time: 04/11/23  1846     History  Chief Complaint  Patient presents with   Eye Problem    Kristina King is a 48 y.o. female.  The history is provided by the patient, the spouse and medical records. No language interpreter was used.  Eye Problem  48 year old female significant history of breast cancer status post double mastectomy recently, with subsequent PE now on Eliquis  for the past 5 days who presents today with complaints of eye changes.  An hour ago, patient noticed scarring of light in her right eye and husband noticed that her pupil was much larger on the right eye compared to the left.  This is new.  She is without any other complaint.  She has signs of mild headache last night which is not unusual for her.  She took her headache medication that has since resolved.  She does not endorse any diplopia or loss of vision no focal numbness or focal weakness no neck pain.  She does not wear contact lens or prescription eyewear.    Home Medications Prior to Admission medications   Medication Sig Start Date End Date Taking? Authorizing Provider  APIXABAN  (ELIQUIS ) VTE STARTER PACK (10MG  AND 5MG ) Take as directed on package: start with two-5mg  tablets twice daily for 7 days. On day 8, switch to one-5mg  tablet twice daily. 04/06/23   Emil Share, DO  Ashwagandha 300 MG TABS Take 600 mg by mouth daily at 12 noon.    [provider]  loratadine (CLARITIN) 10 MG tablet Take 10 mg by mouth daily.    [provider]  magnesium 30 MG tablet Take 150 mg by mouth 2 (two) times daily.    [provider]  meloxicam  (MOBIC ) 15 MG tablet Take 1 tablet daily with food for 5 days. Then take as needed. 07/07/22   Arvell Evalene SAUNDERS, DO  methocarbamol  (ROBAXIN ) 500 MG tablet Take 1 tablet (500 mg total) by mouth every 8 (eight) hours as needed for muscle spasms. 11/23/22    Hudnall, Ludie SAUNDERS, MD  OVER THE COUNTER MEDICATION Take 1 tablet by mouth daily. Chromium 500 mcg, bifidobacterium 25 mg, green tea extract 895 mg (blended together tablet)    [provider]  OVER THE COUNTER MEDICATION Take 1 Scoop by mouth daily. Collagen 11.5 g with tryptophan 140 mg    [provider]  Pyridoxine HCl (VITAMIN B-6 PO) Take by mouth.    [provider]  Ubrogepant  (UBRELVY ) 100 MG TABS Take 1 tablet (100 mg total) by mouth daily as needed (migraine.  May repeat in 2 hrs if needed). 11/25/22   Tabori, Katherine E, MD  Vitamin D , Cholecalciferol, 50 MCG (2000 UT) CAPS Take 2 capsules by mouth daily.    [provider]      Allergies    Chlorhexidine    Review of Systems   Review of Systems  All other systems reviewed and are negative.   Physical Exam Updated Vital Signs BP 138/78 (BP Location: Right Arm)   Pulse 93   Temp 98.2 F (36.8 C) (Oral)   Resp 16   Ht 5' 4.5 (1.638 m)   Wt 58.1 kg   LMP 03/31/2023 (Exact Date) Comment: UPT neg DOS  SpO2 100%   BMI 21.63 kg/m  Physical Exam Vitals and nursing note reviewed.  Constitutional:      General: She is  not in acute distress.    Appearance: She is well-developed.  HENT:     Head: Normocephalic and atraumatic.  Eyes:     General: Lids are normal. Lids are everted, no foreign bodies appreciated. Vision grossly intact. Gaze aligned appropriately. No visual field deficit or scleral icterus.    Extraocular Movements: Extraocular movements intact.     Right eye: Normal extraocular motion and no nystagmus.     Left eye: Normal extraocular motion and no nystagmus.     Conjunctiva/sclera: Conjunctivae normal.     Comments: Right pupil is 5 mm and nonreactive Left pupil 2 mm and reactive  Cardiovascular:     Rate and Rhythm: Normal rate and regular rhythm.  Pulmonary:     Effort: Pulmonary effort is normal.  Abdominal:     Palpations: Abdomen is soft.  Musculoskeletal:         General: Normal range of motion.     Cervical back: Neck supple.     Comments: 5 out of 5 strength to all 4 extremities with equal grip strength.  Skin:    Findings: No rash.  Neurological:     Mental Status: She is alert and oriented to person, place, and time.     GCS: GCS eye subscore is 4. GCS verbal subscore is 5. GCS motor subscore is 6.     Cranial Nerves: No cranial nerve deficit, dysarthria or facial asymmetry.     Sensory: Sensation is intact.     Motor: Motor function is intact.     Coordination: Coordination is intact.  Psychiatric:        Mood and Affect: Mood normal.     ED Results / Procedures / Treatments   Labs (all labs ordered are listed, but only abnormal results are displayed) Labs Reviewed  BASIC METABOLIC PANEL - Abnormal; Notable for the following components:      Result Value   Potassium 3.4 (*)    Glucose, Bld 102 (*)    All other components within normal limits  CBC WITH DIFFERENTIAL/PLATELET  PROTIME-INR    EKG None  Radiology CT ANGIO HEAD NECK W WO CM Result Date: 04/11/2023 CLINICAL DATA:  Initial evaluation for carotid dissection. Dilated right pupil. EXAM: CT ANGIOGRAPHY HEAD AND NECK WITH AND WITHOUT CONTRAST TECHNIQUE: Multidetector CT imaging of the head and neck was performed using the standard protocol during bolus administration of intravenous contrast. Multiplanar CT image reconstructions and MIPs were obtained to evaluate the vascular anatomy. Carotid stenosis measurements (when applicable) are obtained utilizing NASCET criteria, using the distal internal carotid diameter as the denominator. RADIATION DOSE REDUCTION: This exam was performed according to the departmental dose-optimization program which includes automated exposure control, adjustment of the mA and/or kV according to patient size and/or use of iterative reconstruction technique. CONTRAST:  50mL OMNIPAQUE  IOHEXOL  350 MG/ML SOLN COMPARISON:  Prior study from earlier the same  day. FINDINGS: CTA NECK FINDINGS Aortic arch: Standard branching. Imaged portion shows no evidence of aneurysm or dissection. No significant stenosis of the major arch vessel origins. Right carotid system: No evidence of dissection, stenosis (50% or greater), or occlusion. Left carotid system: No evidence of dissection, stenosis (50% or greater), or occlusion. Vertebral arteries: Both vertebral arteries arise from subclavian arteries. Left vertebral artery strongly dominant with a diffusely hypoplastic right vertebral artery. Vertebral arteries patent without stenosis or dissection. Skeleton: No worrisome osseous lesions. Other neck: No other acute finding. Few scattered thyroid  nodules noted, largest of which measures 9 mm on  the right. These are of doubtful significance given size and patient age, with no follow-up imaging recommended (ref: J Am Coll Radiol. 2015 Feb;12(2): 143-50). Upper chest: No other acute finding. Review of the MIP images confirms the above findings CTA HEAD FINDINGS Anterior circulation: Both internal carotid arteries widely patent to the termini without stenosis. A1 segments widely patent. Normal anterior communicating artery complex. Both anterior cerebral arteries widely patent to their distal aspects without stenosis. No M1 stenosis or occlusion. Normal MCA bifurcations. Distal MCA branches well perfused and symmetric. Posterior circulation: Both V4 segments patent without significant stenosis. Neither PICA origin well visualized. Basilar patent without stenosis. Superior cerebellar and posterior cerebral arteries patent bilaterally. Venous sinuses: Patent allowing for timing the contrast bolus. Anatomic variants: Strongly dominant left vertebral artery with hypoplastic right vertebral artery. No aneurysm. Review of the MIP images confirms the above findings IMPRESSION: Normal CTA of the head and neck. No large vessel occlusion, hemodynamically significant stenosis, or other acute  vascular abnormality. No dissection. Electronically Signed   By: Morene Hoard M.D.   On: 04/11/2023 21:03   CT Head Wo Contrast Result Date: 04/11/2023 CLINICAL DATA:  Dilated right pupil EXAM: CT HEAD WITHOUT CONTRAST TECHNIQUE: Contiguous axial images were obtained from the base of the skull through the vertex without intravenous contrast. RADIATION DOSE REDUCTION: This exam was performed according to the departmental dose-optimization program which includes automated exposure control, adjustment of the mA and/or kV according to patient size and/or use of iterative reconstruction technique. COMPARISON:  None Available. FINDINGS: Brain: No evidence of acute infarction, hemorrhage, hydrocephalus, extra-axial collection or mass lesion/mass effect. Mild subcortical white matter and periventricular small vessel ischemic changes. Vascular: No hyperdense vessel or unexpected calcification. Skull: Normal. Negative for fracture or focal lesion. Sinuses/Orbits: The visualized paranasal sinuses are essentially clear. The mastoid air cells are unopacified. Other: None. IMPRESSION: No acute intracranial abnormality. Mild small vessel ischemic changes. Electronically Signed   By: Pinkie Pebbles M.D.   On: 04/11/2023 19:57    Procedures Procedures    Medications Ordered in ED Medications  iohexol  (OMNIPAQUE ) 350 MG/ML injection 75 mL (50 mLs Intravenous Contrast Given 04/11/23 2024)    ED Course/ Medical Decision Making/ A&P                                 Medical Decision Making Amount and/or Complexity of Data Reviewed Labs: ordered. Radiology: ordered.  Risk Prescription drug management.   BP 138/78 (BP Location: Right Arm)   Pulse 93   Temp 98.2 F (36.8 C) (Oral)   Resp 16   Ht 5' 4.5 (1.638 m)   Wt 58.1 kg   LMP 03/31/2023 (Exact Date) Comment: UPT neg DOS  SpO2 100%   BMI 21.63 kg/m   40:71 PM 48 year old female significant history of breast cancer status post double  mastectomy recently, with subsequent PE now on Eliquis  for the past 5 days who presents today with complaints of eye changes.  An hour ago, patient noticed scarring of light in her right eye and husband noticed that her pupil was much larger on the right eye compared to the left.  This is new.  She is without any other complaint.  She has signs of mild headache last night which is not unusual for her.  She took her headache medication that has since resolved.  She does not endorse any diplopia or loss of vision no focal numbness  or focal weakness no neck pain.  She does not wear contact lens or prescription eyewear.  Exam notable for a dilated right pupil at 5 mm and nonreactive compared to her left pupil that is 2 mm and reactive.  Patient does not have any disconjugate gaze, her extraocular movements intact.  Visual fields intact.  I discussed care with Dr. Dasie, will obtain head CT and will consult ophthalmology for recommendation.    I have also consulted neurology Dr. Deedra who recommend head and neck CTA to rule out dissection.  I have also reached out to the thumb allergist Dr. Tobie who felt it is important to also rule out Pancoast tumor which can potentially contribute to her symptoms.  CT scan of the head demonstrated no acute intracranial abnormality however mild small vessel ischemic changes were noted.  Head and neck CTA without any evidence of stenosis or dissection.  I did reach out to talk to CT radiologist Dr. Nicholes who reviewed images and does not think there are any evidence concerning for Pancoast tumor.  I will consult neurology for further workup.  Anticipate brain MRI without contrast to rule out a small lacunar infarct.  -Labs ordered, independently viewed and interpreted by me.  Labs remarkable for reassuring labs -The patient was maintained on a cardiac monitor.  I personally viewed and interpreted the cardiac monitored which showed an underlying rhythm of: NSR -Imaging  independently viewed and interpreted by me and I agree with radiologist's interpretation.  Result remarkable for head CT showing no acute intracranial abnormality however there are small vessel ischemic changes were noted.  Head/neck CTA without concerning finding, no dissection or stenosis, no evidence of pancoast tumor according to radiologist Dr. Nicholes -This patient presents to the ED for concern of pupil changes, this involves an extensive number of treatment options, and is a complaint that carries with it a high risk of complications and morbidity.  The differential diagnosis includes stroke, dissection, Pancoast tumor, drug reaction -Co morbidities that complicate the patient evaluation includes breast CA, PE -Treatment includes monitoring -Reevaluation of the patient after these medicines showed that the patient stayed the same -PCP office notes or outside notes reviewed -Discussion with specialist neurologist Dr. Jerrie, and Dr. Arora as well as ophthalmologist Dr. Tobie -Escalation to admission/observation considered: patients feels much better, is comfortable with discharge, and will follow up with ophthalmology -Prescription medication considered, patient comfortable with continuing her Eliquis  -Social Determinant of Health considered   I spoke with neurology Dr. Jerrie who felt which that patient would best benefit from outpatient follow-up with ophthalmology for further evaluation of her dilated right pupil.  In the setting of no associated headache, or other complaints, patient is stable to be discharged however if she develop any other abnormal concerning symptoms to return as lumbar puncture may be beneficial.  Patient will follow-up closely with her eye specialist for further care.        Final Clinical Impression(s) / ED Diagnoses Final diagnoses:  Anisocoria    Rx / DC Orders ED Discharge Orders     None         Nivia Lonni RIGGERS 04/11/23 2218    Dasie Faden, MD 04/16/23 [phone]

## 2023-04-22 ENCOUNTER — Telehealth: Payer: Self-pay

## 2023-04-22 ENCOUNTER — Encounter: Payer: Self-pay | Admitting: *Deleted

## 2023-04-22 ENCOUNTER — Inpatient Hospital Stay: Payer: Managed Care, Other (non HMO) | Attending: Hematology and Oncology | Admitting: Hematology and Oncology

## 2023-04-22 VITALS — BP 126/80 | HR 72 | Temp 98.2°F | Resp 18 | Ht 64.5 in | Wt 125.0 lb

## 2023-04-22 DIAGNOSIS — F419 Anxiety disorder, unspecified: Secondary | ICD-10-CM | POA: Insufficient documentation

## 2023-04-22 DIAGNOSIS — Z170 Estrogen receptor positive status [ER+]: Secondary | ICD-10-CM | POA: Insufficient documentation

## 2023-04-22 DIAGNOSIS — C50919 Malignant neoplasm of unspecified site of unspecified female breast: Secondary | ICD-10-CM

## 2023-04-22 DIAGNOSIS — D0511 Intraductal carcinoma in situ of right breast: Secondary | ICD-10-CM | POA: Diagnosis present

## 2023-04-22 DIAGNOSIS — Z1589 Genetic susceptibility to other disease: Secondary | ICD-10-CM

## 2023-04-22 DIAGNOSIS — Z1502 Genetic susceptibility to malignant neoplasm of ovary: Secondary | ICD-10-CM | POA: Diagnosis not present

## 2023-04-22 DIAGNOSIS — I2699 Other pulmonary embolism without acute cor pulmonale: Secondary | ICD-10-CM | POA: Diagnosis not present

## 2023-04-22 DIAGNOSIS — Z1501 Genetic susceptibility to malignant neoplasm of breast: Secondary | ICD-10-CM | POA: Diagnosis not present

## 2023-04-22 DIAGNOSIS — Z7901 Long term (current) use of anticoagulants: Secondary | ICD-10-CM | POA: Diagnosis not present

## 2023-04-22 DIAGNOSIS — Z1509 Genetic susceptibility to other malignant neoplasm: Secondary | ICD-10-CM

## 2023-04-22 MED ORDER — BUPROPION HCL ER (XL) 150 MG PO TB24: 150.0000 mg | ORAL_TABLET | Freq: Every day | ORAL | 1 refills | Status: DC

## 2023-04-22 MED ORDER — APIXABAN 5 MG PO TABS: 5.0000 mg | ORAL_TABLET | Freq: Two times a day (BID) | ORAL | 1 refills | Status: DC

## 2023-04-22 NOTE — Assessment & Plan Note (Signed)
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 I discussed the margins.  We also discussed the final staging along with previously performed ER/PR testing.  Treatment plan: No role of radiation or antiestrogen therapy.

## 2023-04-22 NOTE — Telephone Encounter (Signed)
Received fax from [facility] for lab orders that patient requested to be done at her next appt with Dr. Beverely Low. Received the ok from Dr. Beverely Low to do these labs at her CPE on 05/31/23. Labs were future ordered by Dr. Pamelia Hoit.

## 2023-04-22 NOTE — Progress Notes (Signed)
Patient Care Team: Sheliah Hatch, MD as PCP - General (Family Medicine) Donnelly Angelica, RN as Oncology Nurse Navigator Pershing Proud, RN as Oncology Nurse Navigator Serena Croissant, MD as Consulting Physician (Hematology and Oncology) Harriette Bouillon, MD as Consulting Physician (General Surgery)  DIAGNOSIS:  Encounter Diagnoses  Name Primary?   CHEK2-related breast cancer (HCC) Yes   Other acute pulmonary embolism without acute cor pulmonale (HCC)     CHIEF COMPLIANT: F/U after recent Bil mastectomies  HISTORY OF PRESENT ILLNESS:  History of Present Illness   The patient, with a recent history of DCIS leading to bilateral mastectomy, presents with concerns about small blood clots discovered during a recent ER visit. The patient is currently on Eliquis for the blood clots and reports increased anxiety, heart palpitations, night sweats, and migraines. The patient is also concerned about the impact of Eliquis on her daily activities and other medications. The patient had stopped taking Wellbutrin in preparation for starting tamoxifen, which is no longer needed. The patient is also concerned about the timing of her exchange surgery with the plastic surgeon and the impact of Eliquis on this procedure.         ALLERGIES:  is allergic to chlorhexidine.  MEDICATIONS:  Current Outpatient Medications  Medication Sig Dispense Refill   apixaban (ELIQUIS) 5 MG TABS tablet Take 1 tablet (5 mg total) by mouth 2 (two) times daily. 60 tablet 1   buPROPion (WELLBUTRIN XL) 150 MG 24 hr tablet Take 1 tablet (150 mg total) by mouth daily. 30 tablet 1   acetaminophen (TYLENOL) 500 MG tablet Take 1,000 mg by mouth every 6 (six) hours as needed for moderate pain (pain score 4-6) or mild pain (pain score 1-3).     Ashwagandha 300 MG TABS Take 600 mg by mouth daily at 12 noon. (Patient not taking: Reported on 04/11/2023)     ibuprofen (ADVIL) 200 MG tablet Take 400 mg by mouth every 6 (six) hours  as needed for mild pain (pain score 1-3) or moderate pain (pain score 4-6).     loratadine (CLARITIN) 10 MG tablet Take 10 mg by mouth daily. (Patient not taking: Reported on 04/11/2023)     MAGNESIUM PO Take 150 mg by mouth 2 (two) times daily. (Patient not taking: Reported on 04/11/2023)     meloxicam (MOBIC) 15 MG tablet Take 1 tablet daily with food for 5 days. Then take as needed. (Patient not taking: Reported on 04/11/2023) 40 tablet 1   methocarbamol (ROBAXIN) 500 MG tablet Take 1 tablet (500 mg total) by mouth every 8 (eight) hours as needed for muscle spasms. 60 tablet 1   Multiple Vitamins-Minerals (EMERGEN-C IMMUNE PO) Take 1 Dose by mouth daily.     OVER THE COUNTER MEDICATION Take 1 tablet by mouth daily. Chromium 500 mcg, bifidobacterium 25 mg, green tea extract 895 mg (blended together tablet) (Patient not taking: Reported on 04/11/2023)     OVER THE COUNTER MEDICATION Take 1 Scoop by mouth daily. Collagen 11.5 g with tryptophan 140 mg     Pyridoxine HCl (VITAMIN B-6 PO) Take 1 tablet by mouth daily. (Patient not taking: Reported on 04/11/2023)     traMADol (ULTRAM) 50 MG tablet Take 50 mg by mouth every 6 (six) hours as needed for moderate pain (pain score 4-6).     Ubrogepant (UBRELVY) 100 MG TABS Take 1 tablet (100 mg total) by mouth daily as needed (migraine.  May repeat in 2 hrs if needed). 30 tablet  3   Vitamin D, Cholecalciferol, 50 MCG (2000 UT) CAPS Take 2 capsules by mouth daily. (Patient not taking: Reported on 04/11/2023)     No current facility-administered medications for this visit.    PHYSICAL EXAMINATION: ECOG PERFORMANCE STATUS: 1 - Symptomatic but completely ambulatory  Vitals:   04/22/23 0939  BP: 126/80  Pulse: 72  Resp: 18  Temp: 98.2 F (36.8 C)  SpO2: 100%   Filed Weights   04/22/23 0939  Weight: 125 lb (56.7 kg)      LABORATORY DATA:  I have reviewed the data as listed    Latest Ref Rng & Units 04/11/2023    7:29 PM 04/05/2023    3:03 PM 05/28/2022     2:06 PM  CMP  Glucose 70 - 99 mg/dL 562  130  83   BUN 6 - 20 mg/dL 15  16  13    Creatinine 0.44 - 1.00 mg/dL 8.65  7.84  6.96   Sodium 135 - 145 mmol/L 135  136  137   Potassium 3.5 - 5.1 mmol/L 3.4  3.5  3.9   Chloride 98 - 111 mmol/L 98  100  102   CO2 22 - 32 mmol/L 26  28  27    Calcium 8.9 - 10.3 mg/dL 9.4  8.8  9.7   Total Protein 6.0 - 8.3 g/dL   7.1   Total Bilirubin 0.2 - 1.2 mg/dL   0.7   Alkaline Phos 39 - 117 U/L   42   AST 0 - 37 U/L   23   ALT 0 - 35 U/L   15     Lab Results  Component Value Date   WBC 9.6 04/11/2023   HGB 12.6 04/11/2023   HCT 36.8 04/11/2023   MCV 89.5 04/11/2023   PLT 239 04/11/2023   NEUTROABS 7.4 04/11/2023    ASSESSMENT & PLAN:  CHEK2-related breast cancer (HCC) [facility] my risk genetic test: May 2024: 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 I discussed the margins.  We also discussed the final staging along with previously performed ER/PR testing.  Treatment plan: No role of radiation or antiestrogen therapy. ------------------------------------- Assessment and Plan    Breast Cancer Final pathology showed DCIS with no invasive cancer. Bilateral mastectomies performed with majority of breast tissue removed. No need for radiation or tamoxifen due to lack of invasive cancer and recent blood clots. -No further treatment needed for breast cancer.  Postoperative Blood Clots Recent blood  clots post bilateral mastectomies. Currently on Eliquis 5mg  BID. No known family history of blood clots. Discussed potential genetic testing but decided against it due to lack of family history and attributing clots to postoperative period. -Continue Eliquis 5mg  BID. -Plan to check D-dimer in 3 months (March 2025). If negative, will order CT scan to confirm absence of clots. -If planning any procedures (e.g., Botox), hold Eliquis 48 hours prior and consider bridging with short-acting anticoagulant.  Anxiety Reports increased anxiety, heart  palpitations, and headaches. Previously on Wellbutrin which was stopped in preparation for tamoxifen (now not needed). -Resume Wellbutrin 150mg  XL, 30 tablets with refill. -Consider follow-up with primary care provider for further management.  General Health Maintenance -Continue to avoid green tea and grapefruit while on Eliquis. -Consider restarting vitamins and supplements, avoiding those known to increase blood thinning (e.g., turmeric, ashwagandha). -Plan for follow-up appointment in March 2025.          Orders Placed This Encounter  Procedures   D-dimer, quantitative    Standing Status:   Future    Expected Date:   06/04/2023    Expiration Date:   04/21/2024   CMP (Cancer Center only)    Standing Status:   Future    Expiration Date:   04/21/2024   CBC with Differential (Cancer Center Only)    Standing Status:   Future    Expiration Date:   04/21/2024   The patient has a good understanding of the overall plan. she agrees with it. she will call with any problems that may develop before the next visit here. Total time spent: 30 mins including face to face time and time spent for planning, charting and co-ordination of care   Tamsen Meek, MD 04/22/23

## 2023-04-24 ENCOUNTER — Encounter: Payer: Self-pay | Admitting: Hematology and Oncology

## 2023-04-25 NOTE — Therapy (Signed)
OUTPATIENT PHYSICAL THERAPY  UPPER EXTREMITY ONCOLOGY EVALUATION  Patient Name: Kristina King MRN: [id number] DOB:October 01, 1975, 48 y.o., female Today's Date: 04/26/2023  END OF SESSION:  PT End of Session - 04/26/23 1607     Visit Number 1    Number of Visits 12    Date for PT Re-Evaluation 06/07/23    PT Start Time 1504    PT Stop Time 1604    PT Time Calculation (min) 60 min    Activity Tolerance Patient tolerated treatment well    Behavior During Therapy WFL for tasks assessed/performed             Past Medical History:  Diagnosis Date   Allergy    Anxiety    Benign thyroid cyst    aspirated   DCIS (ductal carcinoma in situ)    Depression    Pt denies depression   GERD (gastroesophageal reflux disease)    Headache    History of chicken pox    Past Surgical History:  Procedure Laterality Date   AUGMENTATION MAMMAPLASTY Bilateral    BREAST BIOPSY Right 03/11/2023   [place] RT BREAST BX W LOC DEV 1ST LESION IMG BX SPEC US GUIDE 03/11/2023 [facility]GRAP[facility]   BREAST RECONSTRUCTION WITH PLACEMENT OF TISSUE EXPANDER AND ALLODERM Bilateral 04/02/2023   Procedure: BREAST RECONSTRUCTION WITH PLACEMENT OF TISSUE EXPANDER AND ALLODERM POSSIBLE SILICONE IMPLANT;  Surgeon: Glenna Fellows, MD;  Location: [facility];  Service: Plastics;  Laterality: Bilateral;   BREAST SURGERY     augmentation   ELBOW SURGERY     HARDWARE REMOVAL Right 02/01/2015   Procedure: RIGHT ELBOW HARDWARE REMOVAL;  Surgeon: Teryl Lucy, MD;  Location: [facility];  Service: Orthopedics;  Laterality: Right;   NIPPLE SPARING MASTECTOMY Right 04/02/2023   Procedure: RIGHT NIPPLE SPARING MASTECTOMY;  Surgeon: Harriette Bouillon, MD;  Location: [facility];  Service: General;  Laterality: Right;   SIMPLE MASTECTOMY WITH AXILLARY SENTINEL NODE BIOPSY Left 04/02/2023   Procedure: LEFT RISK REDUCING MASTECTOMY;  Surgeon: Harriette Bouillon, MD;  Location: [facility];  Service: General;  Laterality: Left;   TONSILLECTOMY     TONSILLECTOMY     WRIST ARTHROSCOPY     Patient Active Problem List   Diagnosis Date Noted   Pulmonary embolism (HCC) 04/05/2023   CHEK2-related breast cancer (HCC) 04/02/2023   Menstrual migraine without status migrainosus, not intractable 11/25/2022   CHEK2 gene mutation positive 10/08/2022   Physical exam 04/24/2020   Anxiety 07/18/2019   Multiple thyroid nodules 04/14/2013   RUPTURE, QUADRICEPS TENDON 05/19/2010   LUMBAR SPRAIN AND STRAIN 05/19/2010   ITBS, RIGHT KNEE 04/03/2010    PCP: Neena Rhymes, MD   REFERRING PROVIDER: Billie Ruddy, mD  REFERRING DIAG: s/p right Breast Cancer  THERAPY DIAG:  Ductal carcinoma in situ of right breast  Status post bilateral mastectomy  Pulmonary infarction (HCC)  Stiffness of left shoulder, not elsewhere classified  Stiffness of right shoulder, not elsewhere classified  ONSET DATE: 03/15/2023  Rationale for Evaluation and Treatment: Rehabilitation  SUBJECTIVE:  SUBJECTIVE STATEMENT:  Every day I feel a little better. Coming off the narcotics was a turning point, and getting the drains out. Everything has been positive except the blood clot. On March 3 she has her annual physical and they will do the D-Dimer test and then she will talk to Dr. Pamelia Hoit about the blood test and a CT if needed to see if she can come off the blood thinners. Hoping for late March or into April for Expander exchange. No real pain, but have occasional discomfort. I am trying not to pick up heavy things and if I do my biceps get sore. Last pain Med on 04/06/2023. I feel like I have a lot of instability on the right side because of my prior right elbow ORIF. The right elbow has been more sore and achy since my  surgery because the nerves are more active,.  PERTINENT HISTORY:  Pt is s/p Right Nipple Sparing Mastectomy  with 0+/1 LN and Left risk reducing mastectomy with removal of prior implants and immediate tissue expander placement . Pathology revealed DCIS Cancer with no Invasive Cancer noted. She does  not need to have radiation or anti estrogens. She developed a PE on 04/05/2023 and is presently taking Elliquis. 2 Drains removed 04/08/2023, and 2 remained on the 04/15/2023. Had the first fill on 04/22/2023. She had an episode of Anisocoria and was seen in the ED for a pupil that was much larger than the other and that did not respond to light. It was determined to be from a scopolamine patch that she had on when she washed her hair.  She had a right elbow ORIF nearly 10 years ago and she does not feel as balanced since that.  PAIN:  Are you having pain? No, just discomfort  PRECAUTIONS: right UE lymphedema risk   RED FLAGS: None   WEIGHT BEARING RESTRICTIONS: No  FALLS:  Has patient fallen in last 6 months? No  LIVING ENVIRONMENT: Lives with: lives with their spouse, 2 children in college, [place], [place] Lives in: House/apartment   OCCUPATION: Manufacturing engineer to [employer]  LEISURE: physical activity, walking, pilates(5 D/week), tennis, reading,crafts  HAND DOMINANCE: right   PRIOR LEVEL OF FUNCTION: Independent  PATIENT GOALS: Get ROM back,    OBJECTIVE: Note: Objective measures were completed at Evaluation unless otherwise noted.  COGNITION: Overall cognitive status: Within functional limits for tasks assessed   PALPATION:   OBSERVATIONS / OTHER ASSESSMENTS: drain sites healed but tender, incisions under breasts continue with a lot of glue Right breast with enlarged pores greater than left side  SENSATION: Light touch: Deficits      POSTURE: forward head, rounded shoulders  UPPER EXTREMITY AROM/PROM:  A/PROM RIGHT   eval   Shoulder extension 50  Shoulder flexion 100   Shoulder abduction 88  Shoulder internal rotation   Shoulder external rotation     (Blank rows = not tested)  A/PROM LEFT   eval  Shoulder extension 52  Shoulder flexion 123  Shoulder abduction 100  Shoulder internal rotation   Shoulder external rotation     (Blank rows = not tested)  CERVICAL AROM: All within normal limits:      UPPER EXTREMITY STRENGTH:   LYMPHEDEMA ASSESSMENTS:   SURGERY TYPE/DATE: 04/02/2023 right Nipple Sparing Mastectomy, with removal of prior implants and immediate tissue expander placement.  NUMBER OF LYMPH NODES REMOVED: 0/1  CHEMOTHERAPY: NO  RADIATION:NO  HORMONE TREATMENT: NO  INFECTIONS: no   LYMPHEDEMA ASSESSMENTS:   LANDMARK  RIGHT  eval  At axilla  27.9  15 cm proximal to olecranon process   10 cm proximal to olecranon process 25.  Olecranon process 22.4  15 cm proximal to ulnar styloid process   10 cm proximal to ulnar styloid process 20.2  Just proximal to ulnar styloid process 14.2  Across hand at thumb web space 18.5  At base of 2nd digit 5.35  (Blank rows = not tested)  LANDMARK LEFT  eval  At axilla  27.2  15 cm proximal to olecranon process   10 cm proximal to olecranon process 25.5  Olecranon process 22.5  15 cm proximal to ulnar styloid process   10 cm proximal to ulnar styloid process 19.5  Just proximal to ulnar styloid process 13.7  Across hand at thumb web space 17.4  At base of 2nd digit 5.25  (Blank rows = not tested)   FUNCTIONAL TESTS:    GAIT: WNL   QUICK DASH SURVEY: 25%                                                                                                                            TREATMENT DATE:  04/26/2023 Educated in 4 post op exercises and practiced each x 4-5 reps. Reminded to breathe through exercises to allow muscles to relax, and not to force through pain.    PATIENT EDUCATION:  Education details: 4 post op exercises Person educated: Patient Education method:  Programmer, multimedia, Facilities manager, and Handouts Education comprehension: returned demonstration  HOME EXERCISE PROGRAM: 4 post op exercises taken to comfortable stretch  ASSESSMENT:  CLINICAL IMPRESSION: Patient is a 48 y.o. female who was seen today for physical therapy evaluation and treatment  s/p Right Nipple Sparing Mastectomy  with 0+/1 LN and Left risk reducing mastectomy with removal of prior implants and immediate tissue expander placement . Pathology revealed DCIS Cancer with no Invasive Cancer noted. She does  not need to have radiation or anti estrogens. She developed a PE on 04/05/2023 and is presently taking Elliquis. Two drains removed 04/08/2023, and 2 removed 04/15/2023. She had her first fill on 04/22/2023. She presents with limitations in bilateral shoulder ROM, limiting her functional daily activities, and preventing return to recreational activities. She will benefit from skilled PT to address deficits and return to PLOF.   OBJECTIVE IMPAIRMENTS: decreased activity tolerance, decreased knowledge of condition, decreased mobility, decreased ROM, impaired UE functional use, and postural dysfunction.   ACTIVITY LIMITATIONS: carrying, sleeping, reach over head, and hygiene/grooming  PARTICIPATION LIMITATIONS: laundry and shopping  PERSONAL FACTORS: 1 comorbidity: Bilateral Mastectomies with tissue expanders, PE  are also affecting patient's functional outcome.   REHAB POTENTIAL: Excellent  CLINICAL DECISION MAKING: Stable/uncomplicated  EVALUATION COMPLEXITY: Low  GOALS: Goals reviewed with patient? Yes  SHORT TERM GOALS: Target date: 06/07/2023  Pt will be independent with HEP for bilateral shoulder ROM and strength Baseline: Goal status: INITIAL  2.  Quick dash will be improved to no greater than 12%  to demonstrate improved function Baseline:  Goal status: INITIAL  3.  Pt will have bilateral shoulder flexion and abduction improved to atleast 150 degrees for improved  reaching Baseline:  Goal status: INITIAL  4.  Pt will be able to perform hair care and reach behind her back Baseline:  Goal status: INITIAL  5.  Pt will be independent in ABC strength handout Baseline:  Goal status: INITIAL  PLAN:  PT FREQUENCY: 2x/week  PT DURATION: 6 weeks  PLANNED INTERVENTIONS: 97164- PT Re-evaluation, 97110-Therapeutic exercises, 97530- Therapeutic activity, 97112- Neuromuscular re-education, 97535- Self Care, and 81191- Manual therapy, Dry Needling  PLAN FOR NEXT SESSION: review 4 post op exercises, progress to wand when ready, pulleys, Supine AROM, PROM, STM,  scar massage to drain sites,return to function. Does pt wish to do SOZO screens? ABC class?  Waynette Buttery, PT 04/26/2023, 8:41 PM

## 2023-04-26 ENCOUNTER — Ambulatory Visit: Payer: Managed Care, Other (non HMO) | Attending: Plastic Surgery

## 2023-04-26 ENCOUNTER — Other Ambulatory Visit: Payer: Self-pay | Admitting: *Deleted

## 2023-04-26 ENCOUNTER — Other Ambulatory Visit: Payer: Self-pay

## 2023-04-26 DIAGNOSIS — M25611 Stiffness of right shoulder, not elsewhere classified: Secondary | ICD-10-CM | POA: Insufficient documentation

## 2023-04-26 DIAGNOSIS — Z9013 Acquired absence of bilateral breasts and nipples: Secondary | ICD-10-CM | POA: Diagnosis present

## 2023-04-26 DIAGNOSIS — D0511 Intraductal carcinoma in situ of right breast: Secondary | ICD-10-CM | POA: Diagnosis present

## 2023-04-26 DIAGNOSIS — M25612 Stiffness of left shoulder, not elsewhere classified: Secondary | ICD-10-CM | POA: Insufficient documentation

## 2023-04-26 DIAGNOSIS — I2699 Other pulmonary embolism without acute cor pulmonale: Secondary | ICD-10-CM | POA: Insufficient documentation

## 2023-04-26 MED ORDER — APIXABAN 5 MG PO TABS: 5.0000 mg | ORAL_TABLET | Freq: Two times a day (BID) | ORAL | 1 refills | Status: DC

## 2023-04-27 ENCOUNTER — Telehealth: Payer: Self-pay

## 2023-04-27 NOTE — Telephone Encounter (Signed)
Notified Patient and Pharmacy of prior authorization approval for Eliquis 5 mg Tablets. Medication is approved through 04/26/2024. No other needs or concerns noted at this time.

## 2023-05-03 ENCOUNTER — Ambulatory Visit: Payer: Managed Care, Other (non HMO) | Attending: Plastic Surgery

## 2023-05-03 DIAGNOSIS — Z9013 Acquired absence of bilateral breasts and nipples: Secondary | ICD-10-CM | POA: Diagnosis not present

## 2023-05-03 DIAGNOSIS — I2699 Other pulmonary embolism without acute cor pulmonale: Secondary | ICD-10-CM

## 2023-05-03 DIAGNOSIS — D0511 Intraductal carcinoma in situ of right breast: Secondary | ICD-10-CM | POA: Diagnosis not present

## 2023-05-03 DIAGNOSIS — M25611 Stiffness of right shoulder, not elsewhere classified: Secondary | ICD-10-CM | POA: Diagnosis present

## 2023-05-03 DIAGNOSIS — M25612 Stiffness of left shoulder, not elsewhere classified: Secondary | ICD-10-CM

## 2023-05-03 NOTE — Therapy (Signed)
OUTPATIENT PHYSICAL THERAPY  UPPER EXTREMITY ONCOLOGY TREATMENT  Patient Name: Kristina King MRN: [id number] DOB:[birth year], 48 y.o., female Today's Date: 05/03/2023  END OF SESSION:  PT End of Session - 05/03/23 1201     Visit Number 2    Number of Visits 12    Date for PT Re-Evaluation 06/07/23    PT Start Time 1203    PT Stop Time 1302    PT Time Calculation (min) 59 min    Activity Tolerance Patient tolerated treatment well    Behavior During Therapy WFL for tasks assessed/performed             Past Medical History:  Diagnosis Date   Allergy    Anxiety    Benign thyroid cyst    aspirated   DCIS (ductal carcinoma in situ)    Depression    Pt denies depression   GERD (gastroesophageal reflux disease)    Headache    History of chicken pox    Past Surgical History:  Procedure Laterality Date   AUGMENTATION MAMMAPLASTY Bilateral    BREAST BIOPSY Right 03/11/2023   [place] RT BREAST BX W LOC DEV 1ST LESION IMG BX SPEC US GUIDE 03/11/2023 [facility]   BREAST RECONSTRUCTION WITH PLACEMENT OF TISSUE EXPANDER AND ALLODERM Bilateral 04/02/2023   Procedure: BREAST RECONSTRUCTION WITH PLACEMENT OF TISSUE EXPANDER AND ALLODERM POSSIBLE SILICONE IMPLANT;  Surgeon: Glenna Fellows, MD;  Location: [facility];  Service: Plastics;  Laterality: Bilateral;   BREAST SURGERY     augmentation   ELBOW SURGERY     HARDWARE REMOVAL Right 02/01/2015   Procedure: RIGHT ELBOW HARDWARE REMOVAL;  Surgeon: Teryl Lucy, MD;  Location: [facility];  Service: Orthopedics;  Laterality: Right;   NIPPLE SPARING MASTECTOMY Right 04/02/2023   Procedure: RIGHT NIPPLE SPARING MASTECTOMY;  Surgeon: Harriette Bouillon, MD;  Location: [facility];  Service: General;  Laterality: Right;   SIMPLE MASTECTOMY WITH AXILLARY SENTINEL NODE BIOPSY Left 04/02/2023   Procedure: LEFT RISK REDUCING MASTECTOMY;  Surgeon: Harriette Bouillon, MD;  Location: [facility];  Service: General;  Laterality: Left;   TONSILLECTOMY     TONSILLECTOMY     WRIST ARTHROSCOPY     Patient Active Problem List   Diagnosis Date Noted   Pulmonary embolism (HCC) 04/05/2023   CHEK2-related breast cancer (HCC) 04/02/2023   Menstrual migraine without status migrainosus, not intractable 11/25/2022   CHEK2 gene mutation positive 10/08/2022   Physical exam 04/24/2020   Anxiety 07/18/2019   Multiple thyroid nodules 04/14/2013   RUPTURE, QUADRICEPS TENDON 05/19/2010   LUMBAR SPRAIN AND STRAIN 05/19/2010   ITBS, RIGHT KNEE 04/03/2010    PCP: Neena Rhymes, MD   REFERRING PROVIDER: Billie Ruddy, mD  REFERRING DIAG: s/p right Breast Cancer  THERAPY DIAG:  Ductal carcinoma in situ of right breast  Status post bilateral mastectomy  Pulmonary infarction (HCC)  Stiffness of left shoulder, not elsewhere classified  Stiffness of right shoulder, not elsewhere classified  ONSET DATE: 03/15/2023  Rationale for Evaluation and Treatment: Rehabilitation  SUBJECTIVE:  SUBJECTIVE STATEMENT: Exercises are going well. I think there is a big difference in ROM. My biggest issue is the cold because I get tense. I am having trouble sleeping because I am a side sleeper. My right neck is really sore on the right from the way I slept. I have been doing scar massage to drain incisions and axillary  EVAL  Every day I feel a little better. Coming off the narcotics was a turning point, and getting the drains out. Everything has been positive except the blood clot. On March 3 she has her annual physical and they will do the D-Dimer test and then she will talk to Dr. Pamelia Hoit about the blood test and a CT if needed to see if she can come off the blood thinners. Hoping for late March or into April for  Expander exchange. No real pain, but have occasional discomfort. I am trying not to pick up heavy things and if I do my biceps get sore. Last pain Med on 04/06/2023. I feel like I have a lot of instability on the right side because of my prior right elbow ORIF. The right elbow has been more sore and achy since my surgery because the nerves are more active,.  PERTINENT HISTORY:  Pt is s/p Right Nipple Sparing Mastectomy  with 0+/1 LN and Left risk reducing mastectomy with removal of prior implants and immediate tissue expander placement . Pathology revealed DCIS Cancer with no Invasive Cancer noted. She does  not need to have radiation or anti estrogens. She developed a PE on 04/05/2023 and is presently taking Elliquis. 2 Drains removed 04/08/2023, and 2 remained on the 04/15/2023. Had the first fill on 04/22/2023. She had an episode of Anisocoria and was seen in the ED for a pupil that was much larger than the other and that did not respond to light. It was determined to be from a scopolamine patch that she had on when she washed her hair.  She had a right elbow ORIF nearly 10 years ago and she does not feel as balanced since that.  PAIN:  Are you having pain? No, just discomfort  PRECAUTIONS: right UE lymphedema risk   RED FLAGS: None   WEIGHT BEARING RESTRICTIONS: No  FALLS:  Has patient fallen in last 6 months? 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  LEISURE: physical activity, walking, pilates(5 D/week), tennis, reading,crafts  HAND DOMINANCE: right   PRIOR LEVEL OF FUNCTION: Independent  PATIENT GOALS: Get ROM back,    OBJECTIVE: Note: Objective measures were completed at Evaluation unless otherwise noted.  COGNITION: Overall cognitive status: Within functional limits for tasks assessed   PALPATION:   OBSERVATIONS / OTHER ASSESSMENTS: drain sites healed  but tender, incisions under breasts continue with a lot of glue Right breast with enlarged pores greater than left side  SENSATION: Light touch: Deficits      POSTURE: forward head, rounded shoulders  UPPER EXTREMITY AROM/PROM:  A/PROM RIGHT   eval   Shoulder extension 50  Shoulder flexion 100  Shoulder abduction 88  Shoulder internal rotation   Shoulder external rotation     (Blank rows = not tested)  A/PROM LEFT   eval  Shoulder extension 52  Shoulder flexion 123  Shoulder abduction 100  Shoulder internal rotation   Shoulder external rotation     (Blank rows = not tested)  CERVICAL AROM:  All within normal limits:      UPPER EXTREMITY STRENGTH:   LYMPHEDEMA ASSESSMENTS:   SURGERY TYPE/DATE: 04/02/2023 right Nipple Sparing Mastectomy, with removal of prior implants and immediate tissue expander placement.  NUMBER OF LYMPH NODES REMOVED: 0/1  CHEMOTHERAPY: NO  RADIATION:NO  HORMONE TREATMENT: NO  INFECTIONS: no   LYMPHEDEMA ASSESSMENTS:   LANDMARK RIGHT  eval  At axilla  27.9  15 cm proximal to olecranon process   10 cm proximal to olecranon process 25.  Olecranon process 22.4  15 cm proximal to ulnar styloid process   10 cm proximal to ulnar styloid process 20.2  Just proximal to ulnar styloid process 14.2  Across hand at thumb web space 18.5  At base of 2nd digit 5.35  (Blank rows = not tested)  LANDMARK LEFT  eval  At axilla  27.2  15 cm proximal to olecranon process   10 cm proximal to olecranon process 25.5  Olecranon process 22.5  15 cm proximal to ulnar styloid process   10 cm proximal to ulnar styloid process 19.5  Just proximal to ulnar styloid process 13.7  Across hand at thumb web space 17.4  At base of 2nd digit 5.25  (Blank rows = not tested)   FUNCTIONAL TESTS:    GAIT: WNL   QUICK DASH SURVEY: 25%                                                                                                                             TREATMENT DATE:  05/03/2023 Overhead pulleys flexion and abd x 2 min ea Ball rolls forward x 10 STM to bilateral neck, right pectorals, lateral trunk with Cocoa butter PROM bilateral shoulder flexion, scaption, abduction, , ER to restore ROM with VC's to relax Supine Bilateral AROM flexion, scaption, bilateral horizontal abd x 5   04/26/2023 Educated in 4 post op exercises and practiced each x 4-5 reps. Reminded to breathe through exercises to allow muscles to relax, and not to force through pain.    PATIENT EDUCATION:  Education details: 4 post op exercises Person educated: Patient Education method: Programmer, multimedia, Facilities manager, and Handouts Education comprehension: returned demonstration  HOME EXERCISE PROGRAM: 4 post op exercises taken to comfortable stretch  ASSESSMENT:  CLINICAL IMPRESSION: Pt did very well with new exercises instructed today and had greatly improved ROM. She does require VC's to relax for PROM.Tender Right UT but minimal tenderness in pectorals and lateral trunk.  OBJECTIVE IMPAIRMENTS: decreased activity tolerance, decreased knowledge of condition, decreased mobility, decreased ROM, impaired UE functional use, and postural dysfunction.   ACTIVITY LIMITATIONS: carrying, sleeping, reach over head, and hygiene/grooming  PARTICIPATION LIMITATIONS: laundry and shopping  PERSONAL FACTORS: 1 comorbidity: Bilateral Mastectomies with tissue expanders, PE  are also affecting patient's functional outcome.   REHAB POTENTIAL: Excellent  CLINICAL DECISION MAKING: Stable/uncomplicated  EVALUATION COMPLEXITY: Low  GOALS: Goals reviewed with patient? Yes  SHORT TERM GOALS: Target date: 06/07/2023  Pt will be independent  with HEP for bilateral shoulder ROM and strength Baseline: Goal status: INITIAL  2.  Quick dash will be improved to no greater than 12% to demonstrate improved function Baseline:  Goal status: INITIAL  3.  Pt will have bilateral shoulder  flexion and abduction improved to atleast 150 degrees for improved reaching Baseline:  Goal status: INITIAL  4.  Pt will be able to perform hair care and reach behind her back Baseline:  Goal status: INITIAL  5.  Pt will be independent in ABC strength handout Baseline:  Goal status: INITIAL  PLAN:  PT FREQUENCY: 2x/week  PT DURATION: 6 weeks  PLANNED INTERVENTIONS: 97164- PT Re-evaluation, 97110-Therapeutic exercises, 97530- Therapeutic activity, 97112- Neuromuscular re-education, 97535- Self Care, and [id number]- Manual therapy, Dry Needling  PLAN FOR NEXT SESSION: SOZO next for non baseline, Measure ROM next,How was she after Mon visit,progress to wand when ready, pulleys, Supine AROM, PROM, STM,  scar massage to drain sites,return to function. Does pt wish to do SOZO screensYES, ABC class; no doesn't want to do  [employer], PT 05/03/2023, 1:08 PM

## 2023-05-06 ENCOUNTER — Ambulatory Visit: Payer: Managed Care, Other (non HMO)

## 2023-05-06 DIAGNOSIS — D0511 Intraductal carcinoma in situ of right breast: Secondary | ICD-10-CM | POA: Diagnosis not present

## 2023-05-06 DIAGNOSIS — Z9013 Acquired absence of bilateral breasts and nipples: Secondary | ICD-10-CM

## 2023-05-06 DIAGNOSIS — M25611 Stiffness of right shoulder, not elsewhere classified: Secondary | ICD-10-CM

## 2023-05-06 DIAGNOSIS — I2699 Other pulmonary embolism without acute cor pulmonale: Secondary | ICD-10-CM

## 2023-05-06 DIAGNOSIS — M25612 Stiffness of left shoulder, not elsewhere classified: Secondary | ICD-10-CM

## 2023-05-06 NOTE — Therapy (Signed)
 OUTPATIENT PHYSICAL THERAPY  UPPER EXTREMITY ONCOLOGY TREATMENT  Patient Name: Kristina King MRN: [id number] DOB:07-13-1975, 48 y.o., female Today's Date: 05/06/2023  END OF SESSION:  PT End of Session - 05/06/23 0804     Visit Number 3    Number of Visits 12    Date for PT Re-Evaluation 06/07/23    PT Start Time 0804    PT Stop Time 0901    PT Time Calculation (min) 57 min    Activity Tolerance Patient tolerated treatment well    Behavior During Therapy WFL for tasks assessed/performed             Past Medical History:  Diagnosis Date   Allergy    Anxiety    Benign thyroid  cyst    aspirated   DCIS (ductal carcinoma in situ)    Depression    Pt denies depression   GERD (gastroesophageal reflux disease)    Headache    History of chicken pox    Past Surgical History:  Procedure Laterality Date   AUGMENTATION MAMMAPLASTY Bilateral    BREAST BIOPSY Right 03/11/2023   US  RT BREAST BX W LOC DEV 1ST LESION IMG BX SPEC US  GUIDE 03/11/2023 [facility] MAMMOGRAPHY   BREAST RECONSTRUCTION WITH PLACEMENT OF TISSUE EXPANDER AND ALLODERM Bilateral 04/02/2023   Procedure: BREAST RECONSTRUCTION WITH PLACEMENT OF TISSUE EXPANDER AND ALLODERM POSSIBLE SILICONE IMPLANT;  Surgeon: Arelia Filippo, MD;  Location: [facility];  Service: Plastics;  Laterality: Bilateral;   BREAST SURGERY     augmentation   ELBOW SURGERY     HARDWARE REMOVAL Right 02/01/2015   Procedure: RIGHT ELBOW HARDWARE REMOVAL;  Surgeon: Fonda Olmsted, MD;  Location: [facility];  Service: Orthopedics;  Laterality: Right;   NIPPLE SPARING MASTECTOMY Right 04/02/2023   Procedure: RIGHT NIPPLE SPARING MASTECTOMY;  Surgeon: Vanderbilt Ned, MD;  Location: [facility];  Service: General;  Laterality: Right;   SIMPLE MASTECTOMY WITH AXILLARY SENTINEL NODE BIOPSY Left 04/02/2023   Procedure: LEFT RISK REDUCING MASTECTOMY;  Surgeon: Vanderbilt Ned, MD;  Location: [facility];  Service: General;  Laterality: Left;   TONSILLECTOMY     TONSILLECTOMY     WRIST ARTHROSCOPY     Patient Active Problem List   Diagnosis Date Noted   Pulmonary embolism (HCC) 04/05/2023   CHEK2-related breast cancer (HCC) 04/02/2023   Menstrual migraine without status migrainosus, not intractable 11/25/2022   CHEK2 gene mutation positive 10/08/2022   Physical exam 04/24/2020   Anxiety 07/18/2019   Multiple thyroid  nodules 04/14/2013   RUPTURE, QUADRICEPS TENDON 05/19/2010   LUMBAR SPRAIN AND STRAIN 05/19/2010   ITBS, RIGHT KNEE 04/03/2010    PCP: Comer Greet, MD   REFERRING PROVIDER: Filippo Jacobus, mD  REFERRING DIAG: s/p right Breast Cancer  THERAPY DIAG:  Ductal carcinoma in situ of right breast  Status post bilateral mastectomy  Pulmonary infarction (HCC)  Stiffness of left shoulder, not elsewhere classified  Stiffness of right shoulder, not elsewhere classified  ONSET DATE: 03/15/2023  Rationale for Evaluation and Treatment: Rehabilitation  SUBJECTIVE:  SUBJECTIVE STATEMENT: ROM is going well. Sleeping is still hard because I have to sleep on my back. My neck is doing better  EVAL  Every day I feel a little better. Coming off the narcotics was a turning point, and getting the drains out. Everything has been positive except the blood clot. On March 3 she has her annual physical and they will do the D-Dimer test and then she will talk to Dr. Odean about the blood test and a CT if needed to see if she can come off the blood thinners. Hoping for late March or into April for Expander exchange. No real pain, but have occasional discomfort. I am trying not to pick up heavy things and if I do my biceps get sore. Last pain Med on 04/06/2023. I feel like I have a lot of  instability on the right side because of my prior right elbow ORIF. The right elbow has been more sore and achy since my surgery because the nerves are more active,.  PERTINENT HISTORY:  Pt is s/p Right Nipple Sparing Mastectomy  with 0+/1 LN and Left risk reducing mastectomy with removal of prior implants and immediate tissue expander placement . Pathology revealed DCIS Cancer with no Invasive Cancer noted. She does  not need to have radiation or anti estrogens. She developed a PE on 04/05/2023 and is presently taking Elliquis. 2 Drains removed 04/08/2023, and 2 remained on the 04/15/2023. Had the first fill on 04/22/2023. She had an episode of Anisocoria and was seen in the ED for a pupil that was much larger than the other and that did not respond to light. It was determined to be from a scopolamine  patch that she had on when she washed her hair.  She had a right elbow ORIF nearly 10 years ago and she does not feel as balanced since that.  PAIN:  Are you having pain? No, just discomfort at incision sites  PRECAUTIONS: right UE lymphedema risk   RED FLAGS: None   WEIGHT BEARING RESTRICTIONS: No  FALLS:  Has patient fallen in last 6 months? No  LIVING ENVIRONMENT: Lives with: lives with their spouse, 2 children in college, [place], [employer] Lives in: House/apartment   OCCUPATION: manufacturing engineer to [employer]  LEISURE: physical activity, walking, pilates(5 D/week), tennis, reading,crafts  HAND DOMINANCE: right   PRIOR LEVEL OF FUNCTION: Independent  PATIENT GOALS: Get ROM back,    OBJECTIVE: Note: Objective measures were completed at Evaluation unless otherwise noted.  COGNITION: Overall cognitive status: Within functional limits for tasks assessed   PALPATION:   OBSERVATIONS / OTHER ASSESSMENTS: drain sites healed but tender, incisions under breasts continue with a lot of glue Right breast with enlarged pores greater than left side  SENSATION: Light touch: Deficits       POSTURE: forward head, rounded shoulders  UPPER EXTREMITY AROM/PROM:  A/PROM RIGHT   eval   Shoulder extension 50  Shoulder flexion 100  Shoulder abduction 88  Shoulder internal rotation   Shoulder external rotation     (Blank rows = not tested)  A/PROM LEFT   eval  Shoulder extension 52  Shoulder flexion 123  Shoulder abduction 100  Shoulder internal rotation   Shoulder external rotation     (Blank rows = not tested)  CERVICAL AROM: All within normal limits:      UPPER EXTREMITY STRENGTH:   LYMPHEDEMA ASSESSMENTS:   SURGERY TYPE/DATE: 04/02/2023 right Nipple Sparing Mastectomy, with removal of prior implants and immediate tissue expander placement.  NUMBER OF LYMPH NODES REMOVED: 0/1  CHEMOTHERAPY: NO  RADIATION:NO  HORMONE TREATMENT: NO  INFECTIONS: no   LYMPHEDEMA ASSESSMENTS:   LANDMARK RIGHT  eval  At axilla  27.9  15 cm proximal to olecranon process   10 cm proximal to olecranon process 25.  Olecranon process 22.4  15 cm proximal to ulnar styloid process   10 cm proximal to ulnar styloid process 20.2  Just proximal to ulnar styloid process 14.2  Across hand at thumb web space 18.5  At base of 2nd digit 5.35  (Blank rows = not tested)  LANDMARK LEFT  eval  At axilla  27.2  15 cm proximal to olecranon process   10 cm proximal to olecranon process 25.5  Olecranon process 22.5  15 cm proximal to ulnar styloid process   10 cm proximal to ulnar styloid process 19.5  Just proximal to ulnar styloid process 13.7  Across hand at thumb web space 17.4  At base of 2nd digit 5.25  (Blank rows = not tested)   FUNCTIONAL TESTS:    GAIT: WNL   QUICK DASH SURVEY: 25%                                                                                                                            TREATMENT DATE:  05/06/2023 Discussed SOZO screen as non baseline and performed SOZO Pulleys x 2:30 ea min flex and abd Ball rolls on wall x 10  flexion, 5 abd  LTR with arms outstretched x 5 ea PROM bilateral shoulder flexion, scaption, abd, ER AROM bilateral flexion, horizontal abduction x 5 ea 05/03/2023 Overhead pulleys flexion and abd x 2 min ea Ball rolls forward x 10 STM to bilateral neck, right pectorals, lateral trunk with Cocoa butter PROM bilateral shoulder flexion, scaption, abduction, , ER to restore ROM with VC's to relax Supine Bilateral AROM flexion, scaption, bilateral horizontal abd x 5   04/26/2023 Educated in 4 post op exercises and practiced each x 4-5 reps. Reminded to breathe through exercises to allow muscles to relax, and not to force through pain.    PATIENT EDUCATION:  Education details: 4 post op exercises Person educated: Patient Education method: Programmer, Multimedia, Facilities Manager, and Handouts Education comprehension: returned demonstration  HOME EXERCISE PROGRAM: 4 post op exercises taken to comfortable stretch  ASSESSMENT:  CLINICAL IMPRESSION:  Pt did well with new exercises and liked the LTR stretch but got a digging sensation in the right incision area with LTR to the left. SOZO screen for baseline done today. 3 month screen needs to be set up. ROM visibly improved. AROM more difficult due to painful sensation right inferior breast. OBJECTIVE IMPAIRMENTS: decreased activity tolerance, decreased knowledge of condition, decreased mobility, decreased ROM, impaired UE functional use, and postural dysfunction.   ACTIVITY LIMITATIONS: carrying, sleeping, reach over head, and hygiene/grooming  PARTICIPATION LIMITATIONS: laundry and shopping  PERSONAL FACTORS: 1 comorbidity: Bilateral Mastectomies with tissue expanders, PE  are also affecting patient's  functional outcome.   REHAB POTENTIAL: Excellent  CLINICAL DECISION MAKING: Stable/uncomplicated  EVALUATION COMPLEXITY: Low  GOALS: Goals reviewed with patient? Yes  SHORT TERM GOALS: Target date: 06/07/2023  Pt will be independent with HEP for  bilateral shoulder ROM and strength Baseline: Goal status: INITIAL  2.  Quick dash will be improved to no greater than 12% to demonstrate improved function Baseline:  Goal status: INITIAL  3.  Pt will have bilateral shoulder flexion and abduction improved to atleast 150 degrees for improved reaching Baseline:  Goal status: INITIAL  4.  Pt will be able to perform hair care and reach behind her back Baseline:  Goal status: INITIAL  5.  Pt will be independent in ABC strength handout Baseline:  Goal status: INITIAL  PLAN:  PT FREQUENCY: 2x/week  PT DURATION: 6 weeks  PLANNED INTERVENTIONS: 97164- PT Re-evaluation, 97110-Therapeutic exercises, 97530- Therapeutic activity, 97112- Neuromuscular re-education, 97535- Self Care, and [id number]- Manual therapy, Dry Needling  PLAN FOR NEXT SESSION: set up SOZO next screen , Measure ROM next,Supine AROM, PROM, STM,  scar massage to drain sites,return to function. Does pt wish to do SOZO screensYES, ABC class; no doesn't want to do  [employer], PT 05/06/2023, 9:03 AM

## 2023-05-10 ENCOUNTER — Ambulatory Visit: Payer: Managed Care, Other (non HMO)

## 2023-05-10 DIAGNOSIS — M25611 Stiffness of right shoulder, not elsewhere classified: Secondary | ICD-10-CM

## 2023-05-10 DIAGNOSIS — D0511 Intraductal carcinoma in situ of right breast: Secondary | ICD-10-CM | POA: Diagnosis not present

## 2023-05-10 DIAGNOSIS — I2699 Other pulmonary embolism without acute cor pulmonale: Secondary | ICD-10-CM

## 2023-05-10 DIAGNOSIS — M25612 Stiffness of left shoulder, not elsewhere classified: Secondary | ICD-10-CM

## 2023-05-10 DIAGNOSIS — Z9013 Acquired absence of bilateral breasts and nipples: Secondary | ICD-10-CM

## 2023-05-10 NOTE — Patient Instructions (Signed)
 Over Head Pull: Narrow and Wide Grip   Cancer Rehab [phone]   On back, knees bent, feet flat, band across thighs, elbows straight but relaxed. Pull hands apart (start). Keeping elbows straight, bring arms up and over head, hands toward floor. Keep pull steady on band. Hold momentarily. Return slowly, keeping pull steady, back to start. Then do same with a wider grip on the band (past shoulder width) Repeat _5-10__ times. Band color __yellow____   Side Pull: Double Arm   On back, knees bent, feet flat. Arms perpendicular to body, shoulder level, elbows straight but relaxed. Pull arms out to sides, elbows straight. Resistance band comes across collarbones, hands toward floor. Hold momentarily. Slowly return to starting position. Repeat _5-10__ times. Band color _yellow____   Shoulder Rotation: Double Arm   On back, knees bent, feet flat, elbows tucked at sides, bent 90, hands palms up. Pull hands apart and down toward floor, keeping elbows near sides. Hold momentarily. Slowly return to starting position. Repeat _5-10__ times. Band color __yellow____

## 2023-05-10 NOTE — Therapy (Signed)
 OUTPATIENT PHYSICAL THERAPY  UPPER EXTREMITY ONCOLOGY TREATMENT  Patient Name: Kristina King MRN: [id number] DOB:1976/02/09, 48 y.o., female Today's Date: 05/10/2023  END OF SESSION:  PT End of Session - 05/10/23 1402     Visit Number 4    Number of Visits 12    Date for PT Re-Evaluation 06/07/23    PT Start Time 1402    PT Stop Time 1458    PT Time Calculation (min) 56 min    Activity Tolerance Patient tolerated treatment well    Behavior During Therapy WFL for tasks assessed/performed             Past Medical History:  Diagnosis Date   Allergy    Anxiety    Benign thyroid  cyst    aspirated   DCIS (ductal carcinoma in situ)    Depression    Pt denies depression   GERD (gastroesophageal reflux disease)    Headache    History of chicken pox    Past Surgical History:  Procedure Laterality Date   AUGMENTATION MAMMAPLASTY Bilateral    BREAST BIOPSY Right 03/11/2023   US  RT BREAST BX W LOC DEV 1ST LESION IMG BX SPEC US  GUIDE 03/11/2023 [facility]MO[facility]   BREAST RECONSTRUCTION WITH PLACEMENT OF TISSUE EXPANDER AND ALLODERM Bilateral 04/02/2023   Procedure: BREAST RECONSTRUCTION WITH PLACEMENT OF TISSUE EXPANDER AND ALLODERM POSSIBLE SILICONE IMPLANT;  Surgeon: Alger Infield, MD;  Location: [facility];  Service: Plastics;  Laterality: Bilateral;   BREAST SURGERY     augmentation   ELBOW SURGERY     HARDWARE REMOVAL Right 02/01/2015   Procedure: RIGHT ELBOW HARDWARE REMOVAL;  Surgeon: Osa Blase, MD;  Location: [facility];  Service: Orthopedics;  Laterality: Right;   NIPPLE SPARING MASTECTOMY Right 04/02/2023   Procedure: RIGHT NIPPLE SPARING MASTECTOMY;  Surgeon: Sim Dryer, MD;  Location: [facility];  Service: General;  Laterality: Right;   SIMPLE MASTECTOMY WITH AXILLARY SENTINEL NODE BIOPSY Left 04/02/2023   Procedure: LEFT RISK REDUCING MASTECTOMY;  Surgeon: Sim Dryer, MD;  Location:  [facility];  Service: General;  Laterality: Left;   TONSILLECTOMY     TONSILLECTOMY     WRIST ARTHROSCOPY     Patient Active Problem List   Diagnosis Date Noted   Pulmonary embolism (HCC) 04/05/2023   CHEK2-related breast cancer (HCC) 04/02/2023   Menstrual migraine without status migrainosus, not intractable 11/25/2022   CHEK2 gene mutation positive 10/08/2022   Physical exam 04/24/2020   Anxiety 07/18/2019   Multiple thyroid  nodules 04/14/2013   RUPTURE, QUADRICEPS TENDON 05/19/2010   LUMBAR SPRAIN AND STRAIN 05/19/2010   ITBS, RIGHT KNEE 04/03/2010    PCP: Laymon Priest, MD   REFERRING PROVIDER: Jeanmarie Millet, mD  REFERRING DIAG: s/p right Breast Cancer  THERAPY DIAG:  Ductal carcinoma in situ of right breast  Status post bilateral mastectomy  Pulmonary infarction (HCC)  Stiffness of left shoulder, not elsewhere classified  Stiffness of right shoulder, not elsewhere classified  ONSET DATE: 03/15/2023  Rationale for Evaluation and Treatment: Rehabilitation  SUBJECTIVE:  SUBJECTIVE STATEMENT: Did OK after last visit. Random things make me sore. I couldn't get comfortable to sleep last night.  EVAL  Every day I feel a little better. Coming off the narcotics was a turning point, and getting the drains out. Everything has been positive except the blood clot. On March 3 she has her annual physical and they will do the D-Dimer test and then she will talk to Dr. Lee Public about the blood test and a CT if needed to see if she can come off the blood thinners. Hoping for late March or into April for Expander exchange. No real pain, but have occasional discomfort. I am trying not to pick up heavy things and if I do my biceps get sore. Last pain Med on 04/06/2023. I feel like I have a lot of  instability on the right side because of my prior right elbow ORIF. The right elbow has been more sore and achy since my surgery because the nerves are more active,.  PERTINENT HISTORY:  Pt is s/p Right Nipple Sparing Mastectomy  with 0+/1 LN and Left risk reducing mastectomy with removal of prior implants and immediate tissue expander placement . Pathology revealed DCIS Cancer with no Invasive Cancer noted. She does  not need to have radiation or anti estrogens. She developed a PE on 04/05/2023 and is presently taking Elliquis. 2 Drains removed 04/08/2023, and 2 remained on the 04/15/2023. Had the first fill on 04/22/2023. She had an episode of Anisocoria and was seen in the ED for a pupil that was much larger than the other and that did not respond to light. It was determined to be from a scopolamine  patch that she had on when she washed her hair.  She had a right elbow ORIF nearly 10 years ago and she does not feel as balanced since that.  PAIN:  Are you having pain? No, just discomfort at incision sites  PRECAUTIONS: right UE lymphedema risk   RED FLAGS: None   WEIGHT BEARING RESTRICTIONS: No  FALLS:  Has patient fallen in last 6 months? No  LIVING ENVIRONMENT: Lives with: lives with their spouse, 2 children in college, [place], [place] Lives in: House/apartment   OCCUPATION: Manufacturing engineer to [employer]  LEISURE: physical activity, walking, pilates(5 D/week), tennis, reading,crafts  HAND DOMINANCE: right   PRIOR LEVEL OF FUNCTION: Independent  PATIENT GOALS: Get ROM back,    OBJECTIVE: Note: Objective measures were completed at Evaluation unless otherwise noted.  COGNITION: Overall cognitive status: Within functional limits for tasks assessed   PALPATION:   OBSERVATIONS / OTHER ASSESSMENTS: drain sites healed but tender, incisions under breasts continue with a lot of glue Right breast with enlarged pores greater than left side  SENSATION: Light touch: Deficits       POSTURE: forward head, rounded shoulders  UPPER EXTREMITY AROM/PROM:  A/PROM RIGHT   eval   Shoulder extension 50  Shoulder flexion 100  Shoulder abduction 88  Shoulder internal rotation   Shoulder external rotation     (Blank rows = not tested)  A/PROM LEFT   eval  Shoulder extension 52  Shoulder flexion 123  Shoulder abduction 100  Shoulder internal rotation   Shoulder external rotation     (Blank rows = not tested)  CERVICAL AROM: All within normal limits:      UPPER EXTREMITY STRENGTH:   LYMPHEDEMA ASSESSMENTS:   SURGERY TYPE/DATE: 04/02/2023 right Nipple Sparing Mastectomy, with removal of prior implants and immediate tissue expander placement.  NUMBER OF  LYMPH NODES REMOVED: 0/1  CHEMOTHERAPY: NO  RADIATION:NO  HORMONE TREATMENT: NO  INFECTIONS: no   LYMPHEDEMA ASSESSMENTS:   LANDMARK RIGHT  eval  At axilla  27.9  15 cm proximal to olecranon process   10 cm proximal to olecranon process 25.  Olecranon process 22.4  15 cm proximal to ulnar styloid process   10 cm proximal to ulnar styloid process 20.2  Just proximal to ulnar styloid process 14.2  Across hand at thumb web space 18.5  At base of 2nd digit 5.35  (Blank rows = not tested)  LANDMARK LEFT  eval  At axilla  27.2  15 cm proximal to olecranon process   10 cm proximal to olecranon process 25.5  Olecranon process 22.5  15 cm proximal to ulnar styloid process   10 cm proximal to ulnar styloid process 19.5  Just proximal to ulnar styloid process 13.7  Across hand at thumb web space 17.4  At base of 2nd digit 5.25  (Blank rows = not tested)   FUNCTIONAL TESTS:    GAIT: WNL   QUICK DASH SURVEY: 25%                                                                                                                            TREATMENT DATE:  05/10/2023 Pulleys x 2 min flex and abd x 5 B Ball rolls on wall forward x 10, abd x 5 for ROM Half foam roll 3 D AROM x 5 flex,  scaption, horizontal abd x 5 Supine scapular series flexion, horizontal abd, bilateral ER x 5 with yellow PROM bilateral shoulder flexion, scaption, abduction, IR and ER with VC's to relax STM right UT, Pectorals, right lateral trunk with cocoa butter Updated HEP with supine scapular series except sword 05/06/2023 Discussed SOZO screen as non baseline and performed SOZO Pulleys x 2:30 ea min flex and abd Ball rolls on wall x 10 flexion, 5 abd  LTR with arms outstretched x 5 ea PROM bilateral shoulder flexion, scaption, abd, ER AROM bilateral flexion, horizontal abduction x 5 ea 05/03/2023 Overhead pulleys flexion and abd x 2 min ea Ball rolls forward x 10 STM to bilateral neck, right pectorals, lateral trunk with Cocoa butter PROM bilateral shoulder flexion, scaption, abduction, , ER to restore ROM with VC's to relax Supine Bilateral AROM flexion, scaption, bilateral horizontal abd x 5   04/26/2023 Educated in 4 post op exercises and practiced each x 4-5 reps. Reminded to breathe through exercises to allow muscles to relax, and not to force through pain.    PATIENT EDUCATION:  Education details: 4 post op exercises Person educated: Patient Education method: Programmer, multimedia, Facilities manager, and Handouts Education comprehension: returned demonstration  HOME EXERCISE PROGRAM: 4 post op exercises taken to comfortable stretch  ASSESSMENT:  CLINICAL IMPRESSION: Pt did well with exercises on half foam roll, but had increased pain at right inferior breast while getting off of roll. Pain subsided fairly quickly. No pain with  theraband exercises and reminded to do in comfortable ROM. She requires VC's to relax for PORM, especially on the right.  OBJECTIVE IMPAIRMENTS: decreased activity tolerance, decreased knowledge of condition, decreased mobility, decreased ROM, impaired UE functional use, and postural dysfunction.   ACTIVITY LIMITATIONS: carrying, sleeping, reach over head, and  hygiene/grooming  PARTICIPATION LIMITATIONS: laundry and shopping  PERSONAL FACTORS: 1 comorbidity: Bilateral Mastectomies with tissue expanders, PE  are also affecting patient's functional outcome.   REHAB POTENTIAL: Excellent  CLINICAL DECISION MAKING: Stable/uncomplicated  EVALUATION COMPLEXITY: Low  GOALS: Goals reviewed with patient? Yes  SHORT TERM GOALS: Target date: 06/07/2023  Pt will be independent with HEP for bilateral shoulder ROM and strength Baseline: Goal status: INITIAL  2.  Quick dash will be improved to no greater than 12% to demonstrate improved function Baseline:  Goal status: INITIAL  3.  Pt will have bilateral shoulder flexion and abduction improved to atleast 150 degrees for improved reaching Baseline:  Goal status: INITIAL  4.  Pt will be able to perform hair care and reach behind her back Baseline:  Goal status: INITIAL  5.  Pt will be independent in ABC strength handout Baseline:  Goal status: INITIAL  PLAN:  PT FREQUENCY: 2x/week  PT DURATION: 6 weeks  PLANNED INTERVENTIONS: 97164- PT Re-evaluation, 97110-Therapeutic exercises, 97530- Therapeutic activity, 97112- Neuromuscular re-education, 97535- Self Care, and 64403- Manual therapy, Dry Needling  PLAN FOR NEXT SESSION: set up SOZO next screen , Measure ROM next,Supine AROM, PROM, STM,  scar massage to drain sites,return to function. Does pt wish to do SOZO screensYES, ABC class; no doesn't want to do  Latisha Poland, PT 05/10/2023, 2:59 PM

## 2023-05-12 ENCOUNTER — Ambulatory Visit: Payer: Managed Care, Other (non HMO)

## 2023-05-12 DIAGNOSIS — D0511 Intraductal carcinoma in situ of right breast: Secondary | ICD-10-CM | POA: Diagnosis not present

## 2023-05-12 DIAGNOSIS — M25612 Stiffness of left shoulder, not elsewhere classified: Secondary | ICD-10-CM

## 2023-05-12 DIAGNOSIS — I2699 Other pulmonary embolism without acute cor pulmonale: Secondary | ICD-10-CM

## 2023-05-12 DIAGNOSIS — M25611 Stiffness of right shoulder, not elsewhere classified: Secondary | ICD-10-CM

## 2023-05-12 DIAGNOSIS — Z9013 Acquired absence of bilateral breasts and nipples: Secondary | ICD-10-CM

## 2023-05-12 NOTE — Therapy (Signed)
OUTPATIENT PHYSICAL THERAPY  UPPER EXTREMITY ONCOLOGY TREATMENT  Patient Name: Kristina King MRN: [id number] DOB:02-06-1976, 48 y.o., female Today's Date: 05/12/2023  END OF SESSION:  PT End of Session - 05/12/23 0903     Visit Number 5    Number of Visits 12    Date for PT Re-Evaluation 06/07/23    PT Start Time 0903    PT Stop Time 1010    PT Time Calculation (min) 67 min    Activity Tolerance Patient tolerated treatment well    Behavior During Therapy WFL for tasks assessed/performed             Past Medical History:  Diagnosis Date   Allergy    Anxiety    Benign thyroid cyst    aspirated   DCIS (ductal carcinoma in situ)    Depression    Pt denies depression   GERD (gastroesophageal reflux disease)    Headache    History of chicken pox    Past Surgical History:  Procedure Laterality Date   AUGMENTATION MAMMAPLASTY Bilateral    BREAST BIOPSY Right 03/11/2023   [place] RT BREAST BX W LOC DEV 1ST LESION IMG BX SPEC US GUIDE 03/11/2023 [facility] M[facility]GRAP[facility]   BREAST RECONSTRUCTION WITH PLACEMENT OF TISSUE EXPANDER AND ALLODERM Bilateral 04/02/2023   Procedure: BREAST RECONSTRUCTION WITH PLACEMENT OF TISSUE EXPANDER AND ALLODERM POSSIBLE SILICONE IMPLANT;  Surgeon: Glenna Fellows, MD;  Location: [facility];  Service: Plastics;  Laterality: Bilateral;   BREAST SURGERY     augmentation   ELBOW SURGERY     HARDWARE REMOVAL Right 02/01/2015   Procedure: RIGHT ELBOW HARDWARE REMOVAL;  Surgeon: Teryl Lucy, MD;  Location: [facility];  Service: Orthopedics;  Laterality: Right;   NIPPLE SPARING MASTECTOMY Right 04/02/2023   Procedure: RIGHT NIPPLE SPARING MASTECTOMY;  Surgeon: Harriette Bouillon, MD;  Location: [facility];  Service: General;  Laterality: Right;   SIMPLE MASTECTOMY WITH AXILLARY SENTINEL NODE BIOPSY Left 04/02/2023   Procedure: LEFT RISK REDUCING MASTECTOMY;  Surgeon: Harriette Bouillon, MD;  Location: [facility];  Service: General;  Laterality: Left;   TONSILLECTOMY     TONSILLECTOMY     WRIST ARTHROSCOPY     Patient Active Problem List   Diagnosis Date Noted   Pulmonary embolism (HCC) 04/05/2023   CHEK2-related breast cancer (HCC) 04/02/2023   Menstrual migraine without status migrainosus, not intractable 11/25/2022   CHEK2 gene mutation positive 10/08/2022   Physical exam 04/24/2020   Anxiety 07/18/2019   Multiple thyroid nodules 04/14/2013   RUPTURE, QUADRICEPS TENDON 05/19/2010   LUMBAR SPRAIN AND STRAIN 05/19/2010   ITBS, RIGHT KNEE 04/03/2010    PCP: Neena Rhymes, MD   REFERRING PROVIDER: Billie Ruddy, mD  REFERRING DIAG: s/p right Breast Cancer  THERAPY DIAG:  Ductal carcinoma in situ of right breast  Status post bilateral mastectomy  Pulmonary infarction (HCC)  Stiffness of left shoulder, not elsewhere classified  Stiffness of right shoulder, not elsewhere classified  ONSET DATE: 03/15/2023  Rationale for Evaluation and Treatment: Rehabilitation  SUBJECTIVE:  SUBJECTIVE STATEMENT:  I did OK after last visit. I did my 1 on 1 pilates yesterday and I am sore. We did some stuff on the half roll like stabs, and lots of core work. I am getting those sharp pains below my expander that come at random times. I think it is mainly when I am cold.   EVAL  Every day I feel a little better. Coming off the narcotics was a turning point, and getting the drains out. Everything has been positive except the blood clot. On March 3 she has her annual physical and they will do the D-Dimer test and then she will talk to Dr. Pamelia Hoit about the blood test and a CT if needed to see if she can come off the blood thinners. Hoping for late March or into April for Expander exchange. No real pain, but  have occasional discomfort. I am trying not to pick up heavy things and if I do my biceps get sore. Last pain Med on 04/06/2023. I feel like I have a lot of instability on the right side because of my prior right elbow ORIF. The right elbow has been more sore and achy since my surgery because the nerves are more active,.  PERTINENT HISTORY:  Pt is s/p Right Nipple Sparing Mastectomy  with 0+/1 LN and Left risk reducing mastectomy with removal of prior implants and immediate tissue expander placement . Pathology revealed DCIS Cancer with no Invasive Cancer noted. She does  not need to have radiation or anti estrogens. She developed a PE on 04/05/2023 and is presently taking Elliquis. 2 Drains removed 04/08/2023, and 2 remained on the 04/15/2023. Had the first fill on 04/22/2023. She had an episode of Anisocoria and was seen in the ED for a pupil that was much larger than the other and that did not respond to light. It was determined to be from a scopolamine patch that she had on when she washed her hair.  She had a right elbow ORIF nearly 10 years ago and she does not feel as balanced since that.  PAIN:  Are you having pain?  PAIN:  Are you having pain? Yes NPRS scale: 0-/2/10 Pain location: right lateral breast/inferior breast where expander sits Pain orientation: Right  PAIN TYPE: intermittent Pain description: sharp and stabbing  Aggravating factors: nothing, it just comes Relieving factors: doesn't take anything  PRECAUTIONS: right UE lymphedema risk   RED FLAGS: None   WEIGHT BEARING RESTRICTIONS: No  FALLS:  Has patient fallen in last 6 months? No  LIVING ENVIRONMENT: Lives with: lives with their spouse, 2 children in college, [place], [place] Lives in: House/apartment   OCCUPATION: Manufacturing engineer to [employer]  LEISURE: physical activity, walking, pilates(5 D/week), tennis, reading,crafts  HAND DOMINANCE: right   PRIOR LEVEL OF FUNCTION: Independent  PATIENT GOALS: Get ROM back,     OBJECTIVE: Note: Objective measures were completed at Evaluation unless otherwise noted.  COGNITION: Overall cognitive status: Within functional limits for tasks assessed   PALPATION:   OBSERVATIONS / OTHER ASSESSMENTS: drain sites healed but tender, incisions under breasts continue with a lot of glue Right breast with enlarged pores greater than left side  SENSATION: Light touch: Deficits      POSTURE: forward head, rounded shoulders  UPPER EXTREMITY AROM/PROM:  A/PROM RIGHT   eval   Shoulder extension 50  Shoulder flexion 100  Shoulder abduction 88  Shoulder internal rotation   Shoulder external rotation     (Blank rows = not  tested)  A/PROM LEFT   eval  Shoulder extension 52  Shoulder flexion 123  Shoulder abduction 100  Shoulder internal rotation   Shoulder external rotation     (Blank rows = not tested)  CERVICAL AROM: All within normal limits:      UPPER EXTREMITY STRENGTH:   LYMPHEDEMA ASSESSMENTS:   SURGERY TYPE/DATE: 04/02/2023 right Nipple Sparing Mastectomy, with removal of prior implants and immediate tissue expander placement.  NUMBER OF LYMPH NODES REMOVED: 0/1  CHEMOTHERAPY: NO  RADIATION:NO  HORMONE TREATMENT: NO  INFECTIONS: no   LYMPHEDEMA ASSESSMENTS:   LANDMARK RIGHT  eval  At axilla  27.9  15 cm proximal to olecranon process   10 cm proximal to olecranon process 25.  Olecranon process 22.4  15 cm proximal to ulnar styloid process   10 cm proximal to ulnar styloid process 20.2  Just proximal to ulnar styloid process 14.2  Across hand at thumb web space 18.5  At base of 2nd digit 5.35  (Blank rows = not tested)  LANDMARK LEFT  eval  At axilla  27.2  15 cm proximal to olecranon process   10 cm proximal to olecranon process 25.5  Olecranon process 22.5  15 cm proximal to ulnar styloid process   10 cm proximal to ulnar styloid process 19.5  Just proximal to ulnar styloid process 13.7  Across hand at thumb web  space 17.4  At base of 2nd digit 5.25  (Blank rows = not tested)   FUNCTIONAL TESTS:    GAIT: WNL   QUICK DASH SURVEY: 25%                                                                                                                            TREATMENT DATE:  05/12/2023 Small BB size nodule noted in axillary incision Pulleys x 3 min flex and abd  to loosen up gently Ball rolls on wall forward x 7 PROM bilateral shoulder flexion, scaption, abduction, IR and ER with VC's to relax STM right UT, Pectorals, right lateral trunk with cocoa butter Scar massage to drain incisions Supine scapular series flexion, horizontal abd, bilateral ER x 5 with yellow Standing scapular retraction yellow x 10 05/10/2023 Pulleys x 2 min flex and abd x 5 B Ball rolls on wall forward x 10, abd x 5 for ROM Half foam roll 3 D AROM x 5 flex, scaption, horizontal abd x 5 Supine scapular series flexion, horizontal abd, bilateral ER x 5 with yellow PROM bilateral shoulder flexion, scaption, abduction, IR and ER with VC's to relax STM right UT, Pectorals, right lateral trunk with cocoa butter Updated HEP with supine scapular series except sword 05/06/2023 Discussed SOZO screen as non baseline and performed SOZO Pulleys x 2:30 ea min flex and abd Ball rolls on wall x 10 flexion, 5 abd  LTR with arms outstretched x 5 ea PROM bilateral shoulder flexion, scaption, abd, ER AROM bilateral flexion, horizontal abduction x 5 ea 05/03/2023  Overhead pulleys flexion and abd x 2 min ea Ball rolls forward x 10 STM to bilateral neck, right pectorals, lateral trunk with Cocoa butter PROM bilateral shoulder flexion, scaption, abduction, , ER to restore ROM with VC's to relax Supine Bilateral AROM flexion, scaption, bilateral horizontal abd x 5   04/26/2023 Educated in 4 post op exercises and practiced each x 4-5 reps. Reminded to breathe through exercises to allow muscles to relax, and not to force through  pain.    PATIENT EDUCATION:  Education details: 4 post op exercises Person educated: Patient Education method: Programmer, multimedia, Facilities manager, and Handouts Education comprehension: returned demonstration  HOME EXERCISE PROGRAM: 4 post op exercises taken to comfortable stretch  ASSESSMENT:  CLINICAL IMPRESSION: Pt is having more frequent sharp pains rated 2/10 inferior to the right expander that she relates to be cold. They eased off towards end of session as they did yesterday per her report. She had some difficulty relaxing for PROM initially, but improved with repetition.  She had no increased pain with theraband exercises and her HEP was updated with postural theraband.  OBJECTIVE IMPAIRMENTS: decreased activity tolerance, decreased knowledge of condition, decreased mobility, decreased ROM, impaired UE functional use, and postural dysfunction.   ACTIVITY LIMITATIONS: carrying, sleeping, reach over head, and hygiene/grooming  PARTICIPATION LIMITATIONS: laundry and shopping  PERSONAL FACTORS: 1 comorbidity: Bilateral Mastectomies with tissue expanders, PE  are also affecting patient's functional outcome.   REHAB POTENTIAL: Excellent  CLINICAL DECISION MAKING: Stable/uncomplicated  EVALUATION COMPLEXITY: Low  GOALS: Goals reviewed with patient? Yes  SHORT TERM GOALS: Target date: 06/07/2023  Pt will be independent with HEP for bilateral shoulder ROM and strength Baseline: Goal status: INITIAL  2.  Quick dash will be improved to no greater than 12% to demonstrate improved function Baseline:  Goal status: INITIAL  3.  Pt will have bilateral shoulder flexion and abduction improved to atleast 150 degrees for improved reaching Baseline:  Goal status: INITIAL  4.  Pt will be able to perform hair care and reach behind her back Baseline:  Goal status: INITIAL  5.  Pt will be independent in ABC strength handout Baseline:  Goal status: INITIAL  PLAN:  PT FREQUENCY:  2x/week  PT DURATION: 6 weeks  PLANNED INTERVENTIONS: 97164- PT Re-evaluation, 97110-Therapeutic exercises, 97530- Therapeutic activity, 97112- Neuromuscular re-education, 97535- Self Care, and 91478- Manual therapy, Dry Needling  PLAN FOR NEXT SESSION: set up SOZO next screen , Measure ROM next,Supine AROM, PROM, STM,  scar massage to drain sites,return to function. Does pt wish to do SOZO screensYES, ABC class; no doesn't want to do  Waynette Buttery, PT 05/12/2023, 10:29 AM

## 2023-05-17 ENCOUNTER — Ambulatory Visit: Payer: Managed Care, Other (non HMO)

## 2023-05-17 DIAGNOSIS — M25612 Stiffness of left shoulder, not elsewhere classified: Secondary | ICD-10-CM

## 2023-05-17 DIAGNOSIS — M25611 Stiffness of right shoulder, not elsewhere classified: Secondary | ICD-10-CM

## 2023-05-17 DIAGNOSIS — D0511 Intraductal carcinoma in situ of right breast: Secondary | ICD-10-CM

## 2023-05-17 DIAGNOSIS — Z9013 Acquired absence of bilateral breasts and nipples: Secondary | ICD-10-CM

## 2023-05-17 DIAGNOSIS — I2699 Other pulmonary embolism without acute cor pulmonale: Secondary | ICD-10-CM

## 2023-05-17 NOTE — Therapy (Signed)
OUTPATIENT PHYSICAL THERAPY  UPPER EXTREMITY ONCOLOGY TREATMENT  Patient Name: Kristina King MRN: [id number] DOB:1975-06-11, 48 y.o., female Today's Date: 05/17/2023  END OF SESSION:  PT End of Session - 05/17/23 1502     Visit Number 6    Number of Visits 12    Date for PT Re-Evaluation 06/07/23    PT Start Time 1503    PT Stop Time 1556    PT Time Calculation (min) 53 min    Activity Tolerance Patient tolerated treatment well    Behavior During Therapy WFL for tasks assessed/performed             Past Medical History:  Diagnosis Date   Allergy    Anxiety    Benign thyroid cyst    aspirated   DCIS (ductal carcinoma in situ)    Depression    Pt denies depression   GERD (gastroesophageal reflux disease)    Headache    History of chicken pox    Past Surgical History:  Procedure Laterality Date   AUGMENTATION MAMMAPLASTY Bilateral    BREAST BIOPSY Right 03/11/2023   [place] RT BREAST BX W LOC DEV 1ST LESION IMG BX SPEC US GUIDE 03/11/2023 [facility]   BREAST RECONSTRUCTION WITH PLACEMENT OF TISSUE EXPANDER AND ALLODERM Bilateral 04/02/2023   Procedure: BREAST RECONSTRUCTION WITH PLACEMENT OF TISSUE EXPANDER AND ALLODERM POSSIBLE SILICONE IMPLANT;  Surgeon: Glenna Fellows, MD;  Location: [facility];  Service: Plastics;  Laterality: Bilateral;   BREAST SURGERY     augmentation   ELBOW SURGERY     HARDWARE REMOVAL Right 02/01/2015   Procedure: RIGHT ELBOW HARDWARE REMOVAL;  Surgeon: Teryl Lucy, MD;  Location: [facility];  Service: Orthopedics;  Laterality: Right;   NIPPLE SPARING MASTECTOMY Right 04/02/2023   Procedure: RIGHT NIPPLE SPARING MASTECTOMY;  Surgeon: Harriette Bouillon, MD;  Location: [facility];  Service: General;  Laterality: Right;   SIMPLE MASTECTOMY WITH AXILLARY SENTINEL NODE BIOPSY Left 04/02/2023   Procedure: LEFT RISK REDUCING MASTECTOMY;  Surgeon: Harriette Bouillon, MD;  Location: [facility];  Service: General;  Laterality: Left;   TONSILLECTOMY     TONSILLECTOMY     WRIST ARTHROSCOPY     Patient Active Problem List   Diagnosis Date Noted   Pulmonary embolism (HCC) 04/05/2023   CHEK2-related breast cancer (HCC) 04/02/2023   Menstrual migraine without status migrainosus, not intractable 11/25/2022   CHEK2 gene mutation positive 10/08/2022   Physical exam 04/24/2020   Anxiety 07/18/2019   Multiple thyroid nodules 04/14/2013   RUPTURE, QUADRICEPS TENDON 05/19/2010   LUMBAR SPRAIN AND STRAIN 05/19/2010   ITBS, RIGHT KNEE 04/03/2010    PCP: Neena Rhymes, MD   REFERRING PROVIDER: Billie Ruddy, mD  REFERRING DIAG: s/p right Breast Cancer  THERAPY DIAG:  Ductal carcinoma in situ of right breast  Status post bilateral mastectomy  Pulmonary infarction (HCC)  Stiffness of left shoulder, not elsewhere classified  Stiffness of right shoulder, not elsewhere classified  ONSET DATE: 03/15/2023  Rationale for Evaluation and Treatment: Rehabilitation  SUBJECTIVE:  SUBJECTIVE STATEMENT:  The expander region on the right has been better. I haven't had as much discomfort in the last few days. My ROM is better. I can reach the shower head now with the right arm. I was able to hang clothes on the double rack and get it down with my right arm.  EVAL  Every day I feel a little better. Coming off the narcotics was a turning point, and getting the drains out. Everything has been positive except the blood clot. On March 3 she has her annual physical and they will do the D-Dimer test and then she will talk to Dr. Pamelia Hoit about the blood test and a CT if needed to see if she can come off the blood thinners. Hoping for late March or into April for Expander exchange. No real pain, but have  occasional discomfort. I am trying not to pick up heavy things and if I do my biceps get sore. Last pain Med on 04/06/2023. I feel like I have a lot of instability on the right side because of my prior right elbow ORIF. The right elbow has been more sore and achy since my surgery because the nerves are more active,.  PERTINENT HISTORY:  Pt is s/p Right Nipple Sparing Mastectomy  with 0+/1 LN and Left risk reducing mastectomy with removal of prior implants and immediate tissue expander placement . Pathology revealed DCIS Cancer with no Invasive Cancer noted. She does  not need to have radiation or anti estrogens. She developed a PE on 04/05/2023 and is presently taking Elliquis. 2 Drains removed 04/08/2023, and 2 remained on the 04/15/2023. Had the first fill on 04/22/2023. She had an episode of Anisocoria and was seen in the ED for a pupil that was much larger than the other and that did not respond to light. It was determined to be from a scopolamine patch that she had on when she washed her hair.  She had a right elbow ORIF nearly 10 years ago and she does not feel as balanced since that.  PAIN:  Are you having pain?  PAIN:  Are you having pain? Not today NPRS scale: 0/10 Pain location: right lateral breast/inferior breast where expander sits Pain orientation: Right  PAIN TYPE: intermittent Pain description: sharp and stabbing  Aggravating factors: nothing, it just comes Relieving factors: doesn't take anything  PRECAUTIONS: right UE lymphedema risk   RED FLAGS: None   WEIGHT BEARING RESTRICTIONS: No  FALLS:  Has patient fallen in last 6 months? 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  LEISURE: physical activity, walking, pilates(5 D/week), tennis, reading,crafts  HAND DOMINANCE: right   PRIOR LEVEL OF FUNCTION: Independent  PATIENT GOALS: Get ROM back,     OBJECTIVE: Note: Objective measures were completed at Evaluation unless otherwise noted.  COGNITION: Overall cognitive status: Within functional limits for tasks assessed   PALPATION:   OBSERVATIONS / OTHER ASSESSMENTS: drain sites healed but tender, incisions under breasts continue with a lot of glue Right breast with enlarged pores greater than left side  SENSATION: Light touch: Deficits      POSTURE: forward head, rounded shoulders  UPPER EXTREMITY AROM/PROM:  A/PROM RIGHT   eval  RIGHT 05/17/2023  Shoulder extension 50 56  Shoulder flexion 100 153  Shoulder abduction 88 162  Shoulder internal rotation  60  Shoulder external rotation  100    (  Blank rows = not tested)  A/PROM LEFT   eval LEFT 05/17/2023   Shoulder extension 52 53  Shoulder flexion 123 160  Shoulder abduction 100 167  Shoulder internal rotation  65  Shoulder external rotation  95    (Blank rows = not tested)  CERVICAL AROM: All within normal limits:      UPPER EXTREMITY STRENGTH:   LYMPHEDEMA ASSESSMENTS:   SURGERY TYPE/DATE: 04/02/2023 right Nipple Sparing Mastectomy, with removal of prior implants and immediate tissue expander placement.  NUMBER OF LYMPH NODES REMOVED: 0/1  CHEMOTHERAPY: NO  RADIATION:NO  HORMONE TREATMENT: NO  INFECTIONS: no   LYMPHEDEMA ASSESSMENTS:   LANDMARK RIGHT  eval  At axilla  27.9  15 cm proximal to olecranon process   10 cm proximal to olecranon process 25.  Olecranon process 22.4  15 cm proximal to ulnar styloid process   10 cm proximal to ulnar styloid process 20.2  Just proximal to ulnar styloid process 14.2  Across hand at thumb web space 18.5  At base of 2nd digit 5.35  (Blank rows = not tested)  LANDMARK LEFT  eval  At axilla  27.2  15 cm proximal to olecranon process   10 cm proximal to olecranon process 25.5  Olecranon process 22.5  15 cm proximal to ulnar styloid process   10 cm proximal to ulnar styloid process 19.5   Just proximal to ulnar styloid process 13.7  Across hand at thumb web space 17.4  At base of 2nd digit 5.25  (Blank rows = not tested)   FUNCTIONAL TESTS:    GAIT: WNL   QUICK DASH SURVEY: 25%                                                                                                                            TREATMENT DATE: 05/17/2023 Ball rolls x 10 forward and 5 abduction x 5 Jobes flexion, scaption,abd x 10 ea with back against wall Standing postural theraband x 10 ea, yellow; bilateral scapular retraction, shoulder extension, bilateral ER Supine horizontal abd with yellow x 10 Measured AROM Bilateral shoulders Updated HEP PROM bilateral shoulder flexion, scaption, IR nd ER, multiple VC's on right to relax for PROM   05/12/2023 Small BB size nodule noted in axillary incision Pulleys x 3 min flex and abd  to loosen up gently Ball rolls on wall forward x 7 PROM bilateral shoulder flexion, scaption, abduction, IR and ER with VC's to relax STM right UT, Pectorals, right lateral trunk with cocoa butter Scar massage to drain incisions Supine scapular series flexion, horizontal abd, bilateral ER x 5 with yellow Standing scapular retraction yellow x 10 05/10/2023 Pulleys x 2 min flex and abd x 5 B Ball rolls on wall forward x 10, abd x 5 for ROM Half foam roll 3 D AROM x 5 flex, scaption, horizontal abd x 5 Supine scapular series flexion, horizontal abd, bilateral ER x 5 with yellow PROM bilateral shoulder flexion, scaption,  abduction, IR and ER with VC's to relax STM right UT, Pectorals, right lateral trunk with cocoa butter Updated HEP with supine scapular series except sword 05/06/2023 Discussed SOZO screen as non baseline and performed SOZO Pulleys x 2:30 ea min flex and abd Ball rolls on wall x 10 flexion, 5 abd  LTR with arms outstretched x 5 ea PROM bilateral shoulder flexion, scaption, abd, ER AROM bilateral flexion, horizontal abduction x 5  ea 05/03/2023 Overhead pulleys flexion and abd x 2 min ea Ball rolls forward x 10 STM to bilateral neck, right pectorals, lateral trunk with Cocoa butter PROM bilateral shoulder flexion, scaption, abduction, , ER to restore ROM with VC's to relax Supine Bilateral AROM flexion, scaption, bilateral horizontal abd x 5   04/26/2023 Educated in 4 post op exercises and practiced each x 4-5 reps. Reminded to breathe through exercises to allow muscles to relax, and not to force through pain.    PATIENT EDUCATION:  Education details: 4 post op exercises Person educated: Patient Education method: Programmer, multimedia, Facilities manager, and Handouts Education comprehension: returned demonstration  HOME EXERCISE PROGRAM: 4 post op exercises taken to comfortable stretch  ASSESSMENT:  CLINICAL IMPRESSION: No pain today and excellent improvement in AROM of bilateral shoulders. Pt still requires VC's to relax for PROM on the right but is doing great overall. Advised not to overdo with Pilates tomorrow  OBJECTIVE IMPAIRMENTS: decreased activity tolerance, decreased knowledge of condition, decreased mobility, decreased ROM, impaired UE functional use, and postural dysfunction.   ACTIVITY LIMITATIONS: carrying, sleeping, reach over head, and hygiene/grooming  PARTICIPATION LIMITATIONS: laundry and shopping  PERSONAL FACTORS: 1 comorbidity: Bilateral Mastectomies with tissue expanders, PE  are also affecting patient's functional outcome.   REHAB POTENTIAL: Excellent  CLINICAL DECISION MAKING: Stable/uncomplicated  EVALUATION COMPLEXITY: Low  GOALS: Goals reviewed with patient? Yes  SHORT TERM GOALS: Target date: 06/07/2023  Pt will be independent with HEP for bilateral shoulder ROM and strength Baseline: Goal status: MET 05/17/2023  2.  Quick dash will be improved to no greater than 12% to demonstrate improved function Baseline:  Goal status: INITIAL  3.  Pt will have bilateral shoulder flexion and  abduction improved to atleast 150 degrees for improved reaching Baseline:  Goal status: MET 2015/2025 4.  Pt will be able to perform hair care and reach behind her back Baseline:  Goal status: INITIAL  5.  Pt will be independent in ABC strength handout Baseline:  Goal status: INITIAL  PLAN:  PT FREQUENCY: 2x/week  PT DURATION: 6 weeks  PLANNED INTERVENTIONS: 97164- PT Re-evaluation, 97110-Therapeutic exercises, 97530- Therapeutic activity, 97112- Neuromuscular re-education, 97535- Self Care, and 29562- Manual therapy, Dry Needling  PLAN FOR NEXT SESSION: ABC strength handout?,set up SOZO next screen ,check reach behind back,Supine AROM, PROM, STM,  scar massage to drain sites,return to function. Does pt wish to do SOZO screensYES, ABC class; no doesn't want to do  Waynette Buttery, PT 05/17/2023, 5:33 PM

## 2023-05-24 ENCOUNTER — Ambulatory Visit: Payer: Managed Care, Other (non HMO)

## 2023-05-24 DIAGNOSIS — D0511 Intraductal carcinoma in situ of right breast: Secondary | ICD-10-CM

## 2023-05-24 DIAGNOSIS — M25612 Stiffness of left shoulder, not elsewhere classified: Secondary | ICD-10-CM

## 2023-05-24 DIAGNOSIS — M25611 Stiffness of right shoulder, not elsewhere classified: Secondary | ICD-10-CM

## 2023-05-24 DIAGNOSIS — I2699 Other pulmonary embolism without acute cor pulmonale: Secondary | ICD-10-CM

## 2023-05-24 DIAGNOSIS — Z9013 Acquired absence of bilateral breasts and nipples: Secondary | ICD-10-CM

## 2023-05-24 NOTE — Therapy (Signed)
 OUTPATIENT PHYSICAL THERAPY  UPPER EXTREMITY ONCOLOGY TREATMENT  Patient Name: Kristina King MRN: [id number] DOB:1976-03-30, 48 y.o., female Today's Date: 05/24/2023  END OF SESSION:  PT End of Session - 05/24/23 0905     Visit Number 7    Number of Visits 12    Date for PT Re-Evaluation 06/07/23    PT Start Time 0905    PT Stop Time 0956    PT Time Calculation (min) 51 min    Activity Tolerance Patient tolerated treatment well    Behavior During Therapy WFL for tasks assessed/performed             Past Medical History:  Diagnosis Date   Allergy    Anxiety    Benign thyroid cyst    aspirated   DCIS (ductal carcinoma in situ)    Depression    Pt denies depression   GERD (gastroesophageal reflux disease)    Headache    History of chicken pox    Past Surgical History:  Procedure Laterality Date   AUGMENTATION MAMMAPLASTY Bilateral    BREAST BIOPSY Right 03/11/2023   [place] RT BREAST BX W LOC DEV 1ST LESION IMG BX SPEC US GUIDE 03/11/2023 [facility]AM[facility]GR[facility]   BREAST RECONSTRUCTION WITH PLACEMENT OF TISSUE EXPANDER AND ALLODERM Bilateral 04/02/2023   Procedure: BREAST RECONSTRUCTION WITH PLACEMENT OF TISSUE EXPANDER AND ALLODERM POSSIBLE SILICONE IMPLANT;  Surgeon: Glenna Fellows, MD;  Location: [facility];  Service: Plastics;  Laterality: Bilateral;   BREAST SURGERY     augmentation   ELBOW SURGERY     HARDWARE REMOVAL Right 02/01/2015   Procedure: RIGHT ELBOW HARDWARE REMOVAL;  Surgeon: Teryl Lucy, MD;  Location: [facility];  Service: Orthopedics;  Laterality: Right;   NIPPLE SPARING MASTECTOMY Right 04/02/2023   Procedure: RIGHT NIPPLE SPARING MASTECTOMY;  Surgeon: Harriette Bouillon, MD;  Location: [facility];  Service: General;  Laterality: Right;   SIMPLE MASTECTOMY WITH AXILLARY SENTINEL NODE BIOPSY Left 04/02/2023   Procedure: LEFT RISK REDUCING MASTECTOMY;  Surgeon: Harriette Bouillon, MD;  Location: [facility];  Service: General;  Laterality: Left;   TONSILLECTOMY     TONSILLECTOMY     WRIST ARTHROSCOPY     Patient Active Problem List   Diagnosis Date Noted   Pulmonary embolism (HCC) 04/05/2023   CHEK2-related breast cancer (HCC) 04/02/2023   Menstrual migraine without status migrainosus, not intractable 11/25/2022   CHEK2 gene mutation positive 10/08/2022   Physical exam 04/24/2020   Anxiety 07/18/2019   Multiple thyroid nodules 04/14/2013   RUPTURE, QUADRICEPS TENDON 05/19/2010   LUMBAR SPRAIN AND STRAIN 05/19/2010   ITBS, RIGHT KNEE 04/03/2010    PCP: Neena Rhymes, MD   REFERRING PROVIDER: Billie Ruddy, mD  REFERRING DIAG: s/p right Breast Cancer  THERAPY DIAG:  Ductal carcinoma in situ of right breast  Status post bilateral mastectomy  Pulmonary infarction (HCC)  Stiffness of left shoulder, not elsewhere classified  Stiffness of right shoulder, not elsewhere classified  ONSET DATE: 03/15/2023  Rationale for Evaluation and Treatment: Rehabilitation  SUBJECTIVE:  SUBJECTIVE STATEMENT: I am doing really well. I felt good last week until the end of the week when I got achy again. I started my period but now I am feeling better again. Where the port is got really red, but no heat. Last  night it spread, but today it looks better. Its a little tender to touch over the port on the expander. Shoulders feel a little ach periodically though the day.. I am trying to lay on my side some. I ordered a soft foam roll to use at home.  EVAL  Every day I feel a little better. Coming off the narcotics was a turning point, and getting the drains out. Everything has been positive except the blood clot. On March 3 she has her annual physical and they will do the D-Dimer test and then she will talk  to Dr. Pamelia Hoit about the blood test and a CT if needed to see if she can come off the blood thinners. Hoping for late March or into April for Expander exchange. No real pain, but have occasional discomfort. I am trying not to pick up heavy things and if I do my biceps get sore. Last pain Med on 04/06/2023. I feel like I have a lot of instability on the right side because of my prior right elbow ORIF. The right elbow has been more sore and achy since my surgery because the nerves are more active,.  PERTINENT HISTORY:  Pt is s/p Right Nipple Sparing Mastectomy  with 0+/1 LN and Left risk reducing mastectomy with removal of prior implants and immediate tissue expander placement . Pathology revealed DCIS Cancer with no Invasive Cancer noted. She does  not need to have radiation or anti estrogens. She developed a PE on 04/05/2023 and is presently taking Elliquis. 2 Drains removed 04/08/2023, and 2 remained on the 04/15/2023. Had the first fill on 04/22/2023. She had an episode of Anisocoria and was seen in the ED for a pupil that was much larger than the other and that did not respond to light. It was determined to be from a scopolamine patch that she had on when she washed her hair.  She had a right elbow ORIF nearly 10 years ago and she does not feel as balanced since that.  PAIN:  Are you having pain?  PAIN:  Are you having pain? Not today NPRS scale: 0/10 Pain location: right lateral breast/inferior breast where expander sits Pain orientation: Right  PAIN TYPE: intermittent Pain description: sharp and stabbing  Aggravating factors: nothing, it just comes Relieving factors: doesn't take anything  PRECAUTIONS: right UE lymphedema risk   RED FLAGS: None   WEIGHT BEARING RESTRICTIONS: No  FALLS:  Has patient fallen in last 6 months? No  LIVING ENVIRONMENT: Lives with: lives with their spouse, 2 children in college, [place], [place] Lives in: House/apartment   OCCUPATION: Manufacturing engineer to  [employer]  LEISURE: physical activity, walking, pilates(5 D/week), tennis, reading,crafts  HAND DOMINANCE: right   PRIOR LEVEL OF FUNCTION: Independent  PATIENT GOALS: Get ROM back,    OBJECTIVE: Note: Objective measures were completed at Evaluation unless otherwise noted.  COGNITION: Overall cognitive status: Within functional limits for tasks assessed   PALPATION:   OBSERVATIONS / OTHER ASSESSMENTS: drain sites healed but tender, incisions under breasts continue with a lot of glue Right breast with enlarged pores greater than left side  SENSATION: Light touch: Deficits      POSTURE: forward head, rounded shoulders  UPPER EXTREMITY AROM/PROM:  A/PROM RIGHT   eval  RIGHT 05/17/2023  Shoulder extension 50 56  Shoulder flexion 100 153  Shoulder abduction 88 162  Shoulder internal rotation  60  Shoulder external rotation  100    (Blank rows = not tested)  A/PROM LEFT   eval LEFT 05/17/2023   Shoulder extension 52 53  Shoulder flexion 123 160  Shoulder abduction 100 167  Shoulder internal rotation  65  Shoulder external rotation  95    (Blank rows = not tested)  CERVICAL AROM: All within normal limits:      UPPER EXTREMITY STRENGTH:   LYMPHEDEMA ASSESSMENTS:   SURGERY TYPE/DATE: 04/02/2023 right Nipple Sparing Mastectomy, with removal of prior implants and immediate tissue expander placement. Left prophylactic  NUMBER OF LYMPH NODES REMOVED: 0/1  CHEMOTHERAPY: NO  RADIATION:NO  HORMONE TREATMENT: NO  INFECTIONS: no   LYMPHEDEMA ASSESSMENTS:   LANDMARK RIGHT  eval  At axilla  27.9  15 cm proximal to olecranon process   10 cm proximal to olecranon process 25.  Olecranon process 22.4  15 cm proximal to ulnar styloid process   10 cm proximal to ulnar styloid process 20.2  Just proximal to ulnar styloid process 14.2  Across hand at thumb web space 18.5  At base of 2nd digit 5.35  (Blank rows = not tested)  LANDMARK LEFT  eval  At axilla  27.2   15 cm proximal to olecranon process   10 cm proximal to olecranon process 25.5  Olecranon process 22.5  15 cm proximal to ulnar styloid process   10 cm proximal to ulnar styloid process 19.5  Just proximal to ulnar styloid process 13.7  Across hand at thumb web space 17.4  At base of 2nd digit 5.25  (Blank rows = not tested)   FUNCTIONAL TESTS:    GAIT: WNL   QUICK DASH SURVEY: 25%                                                                                                                            TREATMENT DATE:  05/24/2023 Checked right breast; mild red tinge over port of expander with mild tenderness. Does not apear infected but she is going to cal MD today Ball rolls x10 forward, abd x 5 B ABC flexibility all exercises, and added a few of her own, all resisted exercises with 2# except elevation 1 # all x 10. Did not do core exs today. Gave pt handout and discussed progression of exercises with pt staying at 3# until after her exchange  05/17/2023 Ball rolls x 10 forward and 5 abduction x 5 Jobes flexion, scaption,abd x 10 ea with back against wall Standing postural theraband x 10 ea, yellow; bilateral scapular retraction, shoulder extension, bilateral ER Supine horizontal abd with yellow x 10 Measured AROM Bilateral shoulders Updated HEP PROM bilateral shoulder flexion, scaption, IR nd ER, multiple VC's on right to relax for PROM   05/12/2023 Small BB size nodule noted  in axillary incision Pulleys x 3 min flex and abd  to loosen up gently Ball rolls on wall forward x 7 PROM bilateral shoulder flexion, scaption, abduction, IR and ER with VC's to relax STM right UT, Pectorals, right lateral trunk with cocoa butter Scar massage to drain incisions Supine scapular series flexion, horizontal abd, bilateral ER x 5 with yellow Standing scapular retraction yellow x 10 05/10/2023 Pulleys x 2 min flex and abd x 5 B Ball rolls on wall forward x 10, abd x 5 for ROM Half  foam roll 3 D AROM x 5 flex, scaption, horizontal abd x 5 Supine scapular series flexion, horizontal abd, bilateral ER x 5 with yellow PROM bilateral shoulder flexion, scaption, abduction, IR and ER with VC's to relax STM right UT, Pectorals, right lateral trunk with cocoa butter Updated HEP with supine scapular series except sword 05/06/2023 Discussed SOZO screen as non baseline and performed SOZO Pulleys x 2:30 ea min flex and abd Ball rolls on wall x 10 flexion, 5 abd  LTR with arms outstretched x 5 ea PROM bilateral shoulder flexion, scaption, abd, ER AROM bilateral flexion, horizontal abduction x 5 ea 05/03/2023 Overhead pulleys flexion and abd x 2 min ea Ball rolls forward x 10 STM to bilateral neck, right pectorals, lateral trunk with Cocoa butter PROM bilateral shoulder flexion, scaption, abduction, , ER to restore ROM with VC's to relax Supine Bilateral AROM flexion, scaption, bilateral horizontal abd x 5   04/26/2023 Educated in 4 post op exercises and practiced each x 4-5 reps. Reminded to breathe through exercises to allow muscles to relax, and not to force through pain.    PATIENT EDUCATION:  Education details: 4 post op exercises Person educated: Patient Education method: Programmer, multimedia, Facilities manager, and Handouts Education comprehension: returned demonstration  HOME EXERCISE PROGRAM: 4 post op exercises taken to comfortable stretch  ASSESSMENT:  CLINICAL IMPRESSION: Pt did very well with all ABC strength exercises today and felt really good afterwards. No change in mild redness at right breast. Pt will call MD today.  OBJECTIVE IMPAIRMENTS: decreased activity tolerance, decreased knowledge of condition, decreased mobility, decreased ROM, impaired UE functional use, and postural dysfunction.   ACTIVITY LIMITATIONS: carrying, sleeping, reach over head, and hygiene/grooming  PARTICIPATION LIMITATIONS: laundry and shopping  PERSONAL FACTORS: 1 comorbidity: Bilateral  Mastectomies with tissue expanders, PE  are also affecting patient's functional outcome.   REHAB POTENTIAL: Excellent  CLINICAL DECISION MAKING: Stable/uncomplicated  EVALUATION COMPLEXITY: Low  GOALS: Goals reviewed with patient? Yes  SHORT TERM GOALS: Target date: 06/07/2023  Pt will be independent with HEP for bilateral shoulder ROM and strength Baseline: Goal status: MET 05/17/2023  2.  Quick dash will be improved to no greater than 12% to demonstrate improved function Baseline:  Goal status: INITIAL  3.  Pt will have bilateral shoulder flexion and abduction improved to atleast 150 degrees for improved reaching Baseline:  Goal status: MET 2015/2025 4.  Pt will be able to perform hair care and reach behind her back Baseline:  Goal status: INITIAL  5.  Pt will be independent in ABC strength handout Baseline:  Goal status: INITIAL  PLAN:  PT FREQUENCY: 2x/week  PT DURATION: 6 weeks  PLANNED INTERVENTIONS: 97164- PT Re-evaluation, 97110-Therapeutic exercises, 97530- Therapeutic activity, 97112- Neuromuscular re-education, 97535- Self Care, and 16109- Manual therapy, Dry Needling  PLAN FOR NEXT SESSION: questions about ABC strength, Want to watch ABC video,set up SOZO next screen ,check reach behind back,Supine AROM, PROM, STM,  scar massage to  drain sites,return to function. Does pt wish to do SOZO screensYES, ABC class; no doesn't want to do  Waynette Buttery, PT 05/24/2023, 9:59 AM

## 2023-05-26 ENCOUNTER — Ambulatory Visit: Payer: Managed Care, Other (non HMO)

## 2023-05-26 DIAGNOSIS — D0511 Intraductal carcinoma in situ of right breast: Secondary | ICD-10-CM

## 2023-05-26 DIAGNOSIS — Z9013 Acquired absence of bilateral breasts and nipples: Secondary | ICD-10-CM

## 2023-05-26 DIAGNOSIS — M25612 Stiffness of left shoulder, not elsewhere classified: Secondary | ICD-10-CM

## 2023-05-26 DIAGNOSIS — I2699 Other pulmonary embolism without acute cor pulmonale: Secondary | ICD-10-CM

## 2023-05-26 DIAGNOSIS — M25611 Stiffness of right shoulder, not elsewhere classified: Secondary | ICD-10-CM

## 2023-05-26 NOTE — Therapy (Signed)
 OUTPATIENT PHYSICAL THERAPY  UPPER EXTREMITY ONCOLOGY TREATMENT  Patient Name: Kristina King MRN: [id number] DOB:13-Jan-1976, 48 y.o., female Today's Date: 05/26/2023  END OF SESSION:  PT End of Session - 05/26/23 1208     Visit Number 8    Number of Visits 12    Date for PT Re-Evaluation 06/07/23    PT Start Time 1208    PT Stop Time 1303    PT Time Calculation (min) 55 min    Activity Tolerance Patient tolerated treatment well             Past Medical History:  Diagnosis Date   Allergy    Anxiety    Benign thyroid cyst    aspirated   DCIS (ductal carcinoma in situ)    Depression    Pt denies depression   GERD (gastroesophageal reflux disease)    Headache    History of chicken pox    Past Surgical History:  Procedure Laterality Date   AUGMENTATION MAMMAPLASTY Bilateral    BREAST BIOPSY Right 03/11/2023   [place] RT BREAST BX W LOC DEV 1ST LESION IMG BX SPEC US GUIDE 03/11/2023 [facility] MAMMOGRAPHY   BREAST RECONSTRUCTION WITH PLACEMENT OF TISSUE EXPANDER AND ALLODERM Bilateral 04/02/2023   Procedure: BREAST RECONSTRUCTION WITH PLACEMENT OF TISSUE EXPANDER AND ALLODERM POSSIBLE SILICONE IMPLANT;  Surgeon: Glenna Fellows, MD;  Location: [facility];  Service: Plastics;  Laterality: Bilateral;   BREAST SURGERY     augmentation   ELBOW SURGERY     HARDWARE REMOVAL Right 02/01/2015   Procedure: RIGHT ELBOW HARDWARE REMOVAL;  Surgeon: Teryl Lucy, MD;  Location: [facility];  Service: Orthopedics;  Laterality: Right;   NIPPLE SPARING MASTECTOMY Right 04/02/2023   Procedure: RIGHT NIPPLE SPARING MASTECTOMY;  Surgeon: Harriette Bouillon, MD;  Location: [facility];  Service: General;  Laterality: Right;   SIMPLE MASTECTOMY WITH AXILLARY SENTINEL NODE BIOPSY Left 04/02/2023   Procedure: LEFT RISK REDUCING MASTECTOMY;  Surgeon: Harriette Bouillon, MD;  Location: [facility];  Service: General;  Laterality: Left;   TONSILLECTOMY      TONSILLECTOMY     WRIST ARTHROSCOPY     Patient Active Problem List   Diagnosis Date Noted   Pulmonary embolism (HCC) 04/05/2023   CHEK2-related breast cancer (HCC) 04/02/2023   Menstrual migraine without status migrainosus, not intractable 11/25/2022   CHEK2 gene mutation positive 10/08/2022   Physical exam 04/24/2020   Anxiety 07/18/2019   Multiple thyroid nodules 04/14/2013   RUPTURE, QUADRICEPS TENDON 05/19/2010   LUMBAR SPRAIN AND STRAIN 05/19/2010   ITBS, RIGHT KNEE 04/03/2010    PCP: Neena Rhymes, MD   REFERRING PROVIDER: Billie Ruddy, mD  REFERRING DIAG: s/p right Breast Cancer  THERAPY DIAG:  Ductal carcinoma in situ of right breast  Status post bilateral mastectomy  Pulmonary infarction (HCC)  Stiffness of left shoulder, not elsewhere classified  Stiffness of right shoulder, not elsewhere classified  ONSET DATE: 03/15/2023  Rationale for Evaluation and Treatment: Rehabilitation  SUBJECTIVE:  SUBJECTIVE STATEMENT: I am so glad to be having surgery on March 28. I walked 2- 2 and a half miles the last couple of days. I used my foam roller at home too. I see Dr. Leta Baptist again on the 13th as a post op/Pre-op. She checked a picture of my breast and said it is fine.  EVAL  Every day I feel a little better. Coming off the narcotics was a turning point, and getting the drains out. Everything has been positive except the blood clot. On March 3 she has her annual physical and they will do the D-Dimer test and then she will talk to Dr. Pamelia Hoit about the blood test and a CT if needed to see if she can come off the blood thinners. Hoping for late March or into April for Expander exchange. No real pain, but have occasional discomfort. I am trying not to pick up heavy things and if I  do my biceps get sore. Last pain Med on 04/06/2023. I feel like I have a lot of instability on the right side because of my prior right elbow ORIF. The right elbow has been more sore and achy since my surgery because the nerves are more active,.  PERTINENT HISTORY:  Pt is s/p Right Nipple Sparing Mastectomy  with 0+/1 LN and Left risk reducing mastectomy with removal of prior implants and immediate tissue expander placement . Pathology revealed DCIS Cancer with no Invasive Cancer noted. She does  not need to have radiation or anti estrogens. She developed a PE on 04/05/2023 and is presently taking Elliquis. 2 Drains removed 04/08/2023, and 2 remained on the 04/15/2023. Had the first fill on 04/22/2023. She had an episode of Anisocoria and was seen in the ED for a pupil that was much larger than the other and that did not respond to light. It was determined to be from a scopolamine patch that she had on when she washed her hair.  She had a right elbow ORIF nearly 10 years ago and she does not feel as balanced since that.  PAIN:  Are you having pain?  PAIN:  Are you having pain? Not today NPRS scale: 0/10 Pain location: right lateral breast/inferior breast where expander sits Pain orientation: Right  PAIN TYPE: intermittent Pain description: sharp and stabbing  Aggravating factors: nothing, it just comes Relieving factors: doesn't take anything  PRECAUTIONS: right UE lymphedema risk   RED FLAGS: None   WEIGHT BEARING RESTRICTIONS: No  FALLS:  Has patient fallen in last 6 months? No  LIVING ENVIRONMENT: Lives with: lives with their spouse, 2 children in college, [place], [place] Lives in: House/apartment   OCCUPATION: Manufacturing engineer to [employer]  LEISURE: physical activity, walking, pilates(5 D/week), tennis, reading,crafts  HAND DOMINANCE: right   PRIOR LEVEL OF FUNCTION: Independent  PATIENT GOALS: Get ROM back,    OBJECTIVE: Note: Objective measures were completed at Evaluation unless  otherwise noted.  COGNITION: Overall cognitive status: Within functional limits for tasks assessed   PALPATION:   OBSERVATIONS / OTHER ASSESSMENTS: drain sites healed but tender, incisions under breasts continue with a lot of glue Right breast with enlarged pores greater than left side  SENSATION: Light touch: Deficits      POSTURE: forward head, rounded shoulders  UPPER EXTREMITY AROM/PROM:  A/PROM RIGHT   eval  RIGHT 05/17/2023  Shoulder extension 50 56  Shoulder flexion 100 153  Shoulder abduction 88 162  Shoulder internal rotation  60  Shoulder external rotation  100    (  Blank rows = not tested)  A/PROM LEFT   eval LEFT 05/17/2023   Shoulder extension 52 53  Shoulder flexion 123 160  Shoulder abduction 100 167  Shoulder internal rotation  65  Shoulder external rotation  95    (Blank rows = not tested)  CERVICAL AROM: All within normal limits:      UPPER EXTREMITY STRENGTH:   LYMPHEDEMA ASSESSMENTS:   SURGERY TYPE/DATE: 04/02/2023 right Nipple Sparing Mastectomy, with removal of prior implants and immediate tissue expander placement. Left prophylactic  NUMBER OF LYMPH NODES REMOVED: 0/1  CHEMOTHERAPY: NO  RADIATION:NO  HORMONE TREATMENT: NO  INFECTIONS: no   LYMPHEDEMA ASSESSMENTS:   LANDMARK RIGHT  eval  At axilla  27.9  15 cm proximal to olecranon process   10 cm proximal to olecranon process 25.  Olecranon process 22.4  15 cm proximal to ulnar styloid process   10 cm proximal to ulnar styloid process 20.2  Just proximal to ulnar styloid process 14.2  Across hand at thumb web space 18.5  At base of 2nd digit 5.35  (Blank rows = not tested)  LANDMARK LEFT  eval  At axilla  27.2  15 cm proximal to olecranon process   10 cm proximal to olecranon process 25.5  Olecranon process 22.5  15 cm proximal to ulnar styloid process   10 cm proximal to ulnar styloid process 19.5  Just proximal to ulnar styloid process 13.7  Across hand at  thumb web space 17.4  At base of 2nd digit 5.25  (Blank rows = not tested)   FUNCTIONAL TESTS:    GAIT: WNL   QUICK DASH SURVEY: EVAL 25% , 05/26/2023 16%                                                                                                                            TREATMENT DATE:  05/26/2023 Ball rolls x 10 forward, abd x 10 ea 4 D ball stabs on wall with red wted ball x 20 ea B x 15 Postural TB with red for scapular retraction, shoulder extension, ER Standing horizontal abd x 10 red Supine on full roll; alternating arms x 10 flexion, scaption, horizontal abduction x 10, HA x 3 B   05/24/2023 Checked right breast; mild red tinge over port of expander with mild tenderness. Does not apear infected but she is going to cal MD today Ball rolls x10 forward, abd x 5 B ABC flexibility all exercises, and added a few of her own, all resisted exercises with 2# except elevation 1 # all x 10. Did not do core exs today. Gave pt handout and discussed progression of exercises with pt staying at 3# until after her exchange  05/17/2023 Ball rolls x 10 forward and 5 abduction x 5 Jobes flexion, scaption,abd x 10 ea with back against wall Standing postural theraband x 10 ea, yellow; bilateral scapular retraction, shoulder extension, bilateral ER Supine horizontal abd with yellow x 10 Measured AROM  Bilateral shoulders Updated HEP PROM bilateral shoulder flexion, scaption, IR nd ER, multiple VC's on right to relax for PROM   05/12/2023 Small BB size nodule noted in axillary incision Pulleys x 3 min flex and abd  to loosen up gently Ball rolls on wall forward x 7 PROM bilateral shoulder flexion, scaption, abduction, IR and ER with VC's to relax STM right UT, Pectorals, right lateral trunk with cocoa butter Scar massage to drain incisions Supine scapular series flexion, horizontal abd, bilateral ER x 5 with yellow Standing scapular retraction yellow x 10 05/10/2023 Pulleys x 2 min  flex and abd x 5 B Ball rolls on wall forward x 10, abd x 5 for ROM Half foam roll 3 D AROM x 5 flex, scaption, horizontal abd x 5 Supine scapular series flexion, horizontal abd, bilateral ER x 5 with yellow PROM bilateral shoulder flexion, scaption, abduction, IR and ER with VC's to relax STM right UT, Pectorals, right lateral trunk with cocoa butter Updated HEP with supine scapular series except sword 05/06/2023 Discussed SOZO screen as non baseline and performed SOZO Pulleys x 2:30 ea min flex and abd Ball rolls on wall x 10 flexion, 5 abd  LTR with arms outstretched x 5 ea PROM bilateral shoulder flexion, scaption, abd, ER AROM bilateral flexion, horizontal abduction x 5 ea 05/03/2023 Overhead pulleys flexion and abd x 2 min ea Ball rolls forward x 10 STM to bilateral neck, right pectorals, lateral trunk with Cocoa butter PROM bilateral shoulder flexion, scaption, abduction, , ER to restore ROM with VC's to relax Supine Bilateral AROM flexion, scaption, bilateral horizontal abd x 5   04/26/2023 Educated in 4 post op exercises and practiced each x 4-5 reps. Reminded to breathe through exercises to allow muscles to relax, and not to force through pain.    PATIENT EDUCATION:  Education details: 4 post op exercises Person educated: Patient Education method: Programmer, multimedia, Demonstration, and Handouts Education comprehension: returned demonstration  HOME EXERCISE PROGRAM: 4 post op exercises taken to comfortable stretch  ASSESSMENT:  CLINICAL IMPRESSION: Pt has achieved all goals established except for quick dash. She is pending exchange surgery end of march and will come back for Re-eval at that time. Pt progressed to red theraband for postural exercises without complaint.  OBJECTIVE IMPAIRMENTS: decreased activity tolerance, decreased knowledge of condition, decreased mobility, decreased ROM, impaired UE functional use, and postural dysfunction.   ACTIVITY LIMITATIONS: carrying,  sleeping, reach over head, and hygiene/grooming  PARTICIPATION LIMITATIONS: laundry and shopping  PERSONAL FACTORS: 1 comorbidity: Bilateral Mastectomies with tissue expanders, PE  are also affecting patient's functional outcome.   REHAB POTENTIAL: Excellent  CLINICAL DECISION MAKING: Stable/uncomplicated  EVALUATION COMPLEXITY: Low  GOALS: Goals reviewed with patient? Yes  SHORT TERM GOALS: Target date: 06/07/2023  Pt will be independent with HEP for bilateral shoulder ROM and strength Baseline: Goal status: MET 05/17/2023  2.  Quick dash will be improved to no greater than 12% to demonstrate improved function Baseline:  Goal status: In Progress (16% 05/26/23) 3.  Pt will have bilateral shoulder flexion and abduction improved to atleast 150 degrees for improved reaching Baseline:  Goal status: MET 05/15/2023 4.  Pt will be able to perform hair care and reach behind her back Baseline:  Goal status: MET 05/26/2023  5.  Pt will be independent in ABC strength handout Baseline:  Goal status: MET 05/26/2023 PLAN:  PT FREQUENCY: 2x/week  PT DURATION: 6 weeks  PLANNED INTERVENTIONS: 97164- PT Re-evaluation, 97110-Therapeutic exercises, 97530- Therapeutic activity, 97112-  Neuromuscular re-education, 97535- Self Care, and 21308- Manual therapy, Dry Needling  PLAN FOR NEXT SESSION: questions about ABC strength, Want to watch ABC video,set up SOZO next screen ,check reach behind back,Supine AROM, PROM, STM,  scar massage to drain sites,return to function. Does pt wish to do SOZO screensYES, ABC class; no doesn't want to do  [employer], PT 05/26/2023, 1:05 PM

## 2023-05-31 ENCOUNTER — Encounter: Payer: Managed Care, Other (non HMO) | Admitting: Family Medicine

## 2023-06-02 ENCOUNTER — Ambulatory Visit (INDEPENDENT_AMBULATORY_CARE_PROVIDER_SITE_OTHER): Admitting: Family Medicine

## 2023-06-02 VITALS — BP 122/76 | Ht 64.5 in | Wt 124.0 lb

## 2023-06-02 DIAGNOSIS — M25551 Pain in right hip: Secondary | ICD-10-CM | POA: Diagnosis not present

## 2023-06-02 NOTE — Patient Instructions (Signed)
 Do hip flexor stretches and strengthening exercises. Heat 15 minutes at a time but use ice after walking/exercise. Consider physical therapy if you're not improving. Tylenol if needed. Follow up with me in 6 weeks or as needed.

## 2023-06-03 ENCOUNTER — Encounter: Payer: Self-pay | Admitting: Family Medicine

## 2023-06-03 ENCOUNTER — Inpatient Hospital Stay: Payer: Managed Care, Other (non HMO) | Attending: Hematology and Oncology

## 2023-06-03 DIAGNOSIS — C50919 Malignant neoplasm of unspecified site of unspecified female breast: Secondary | ICD-10-CM

## 2023-06-03 DIAGNOSIS — I2699 Other pulmonary embolism without acute cor pulmonale: Secondary | ICD-10-CM | POA: Diagnosis present

## 2023-06-03 DIAGNOSIS — Z7901 Long term (current) use of anticoagulants: Secondary | ICD-10-CM | POA: Insufficient documentation

## 2023-06-03 LAB — CBC WITH DIFFERENTIAL (CANCER CENTER ONLY)
Abs Immature Granulocytes: 0.02 10*3/uL (ref 0.00–0.07)
Basophils Absolute: 0 10*3/uL (ref 0.0–0.1)
Basophils Relative: 0 %
Eosinophils Absolute: 0.1 10*3/uL (ref 0.0–0.5)
Eosinophils Relative: 1 %
HCT: 40.3 % (ref 36.0–46.0)
Hemoglobin: 13.3 g/dL (ref 12.0–15.0)
Immature Granulocytes: 0 %
Lymphocytes Relative: 27 %
Lymphs Abs: 2.2 10*3/uL (ref 0.7–4.0)
MCH: 30.1 pg (ref 26.0–34.0)
MCHC: 33 g/dL (ref 30.0–36.0)
MCV: 91.2 fL (ref 80.0–100.0)
Monocytes Absolute: 0.5 10*3/uL (ref 0.1–1.0)
Monocytes Relative: 6 %
Neutro Abs: 5.1 10*3/uL (ref 1.7–7.7)
Neutrophils Relative %: 66 %
Platelet Count: 179 10*3/uL (ref 150–400)
RBC: 4.42 MIL/uL (ref 3.87–5.11)
RDW: 12.7 % (ref 11.5–15.5)
WBC Count: 7.9 10*3/uL (ref 4.0–10.5)
nRBC: 0 % (ref 0.0–0.2)

## 2023-06-03 LAB — CMP (CANCER CENTER ONLY)
ALT: 10 U/L (ref 0–44)
AST: 18 U/L (ref 15–41)
Albumin: 4.2 g/dL (ref 3.5–5.0)
Alkaline Phosphatase: 57 U/L (ref 38–126)
Anion gap: 3 — ABNORMAL LOW (ref 5–15)
BUN: 15 mg/dL (ref 6–20)
CO2: 31 mmol/L (ref 22–32)
Calcium: 9.2 mg/dL (ref 8.9–10.3)
Chloride: 105 mmol/L (ref 98–111)
Creatinine: 0.81 mg/dL (ref 0.44–1.00)
GFR, Estimated: >60 mL/min (ref >60)
Glucose, Bld: 96 mg/dL (ref 70–99)
Potassium: 4.2 mmol/L (ref 3.5–5.1)
Sodium: 139 mmol/L (ref 135–145)
Total Bilirubin: 0.4 mg/dL (ref 0.0–1.2)
Total Protein: 6.8 g/dL (ref 6.5–8.1)

## 2023-06-03 LAB — D-DIMER, QUANTITATIVE: D-Dimer, Quant: <0.27 ug{FEU}/mL (ref 0.00–0.50)

## 2023-06-03 NOTE — Progress Notes (Signed)
 PCP: Sheliah Hatch, MD  Subjective:   HPI: Patient is a 48 y.o. female here for bilateral hip pain.  Patient denies known injury or trauma. She started walking more after a long layoff due to breast cancer bilateral mastectomy and probable small PE (getting labwork tomorrow and evaluation - hoping to come off blood thinners). Noticed in past 2-3 weeks anterior hip pain worse on the right. No bruising or swelling. No numbness/tingling.  Past Medical History:  Diagnosis Date   Allergy    Anxiety    Benign thyroid cyst    aspirated   DCIS (ductal carcinoma in situ)    Depression    Pt denies depression   GERD (gastroesophageal reflux disease)    Headache    History of chicken pox     Current Outpatient Medications on File Prior to Visit  Medication Sig Dispense Refill   acetaminophen (TYLENOL) 500 MG tablet Take 1,000 mg by mouth every 6 (six) hours as needed for moderate pain (pain score 4-6) or mild pain (pain score 1-3).     apixaban (ELIQUIS) 5 MG TABS tablet Take 1 tablet (5 mg total) by mouth 2 (two) times daily. 60 tablet 1   Ashwagandha 300 MG TABS Take 600 mg by mouth daily at 12 noon. (Patient not taking: Reported on 04/11/2023)     buPROPion (WELLBUTRIN XL) 150 MG 24 hr tablet Take 1 tablet (150 mg total) by mouth daily. 30 tablet 1   ibuprofen (ADVIL) 200 MG tablet Take 400 mg by mouth every 6 (six) hours as needed for mild pain (pain score 1-3) or moderate pain (pain score 4-6).     loratadine (CLARITIN) 10 MG tablet Take 10 mg by mouth daily. (Patient not taking: Reported on 04/11/2023)     MAGNESIUM PO Take 150 mg by mouth 2 (two) times daily. (Patient not taking: Reported on 04/11/2023)     meloxicam (MOBIC) 15 MG tablet Take 1 tablet daily with food for 5 days. Then take as needed. (Patient not taking: Reported on 04/11/2023) 40 tablet 1   methocarbamol (ROBAXIN) 500 MG tablet Take 1 tablet (500 mg total) by mouth every 8 (eight) hours as needed for muscle spasms.  60 tablet 1   Multiple Vitamins-Minerals (EMERGEN-C IMMUNE PO) Take 1 Dose by mouth daily.     OVER THE COUNTER MEDICATION Take 1 tablet by mouth daily. Chromium 500 mcg, bifidobacterium 25 mg, green tea extract 895 mg (blended together tablet) (Patient not taking: Reported on 04/11/2023)     OVER THE COUNTER MEDICATION Take 1 Scoop by mouth daily. Collagen 11.5 g with tryptophan 140 mg     Pyridoxine HCl (VITAMIN B-6 PO) Take 1 tablet by mouth daily. (Patient not taking: Reported on 04/11/2023)     traMADol (ULTRAM) 50 MG tablet Take 50 mg by mouth every 6 (six) hours as needed for moderate pain (pain score 4-6).     Ubrogepant (UBRELVY) 100 MG TABS Take 1 tablet (100 mg total) by mouth daily as needed (migraine.  May repeat in 2 hrs if needed). 30 tablet 3   Vitamin D, Cholecalciferol, 50 MCG (2000 UT) CAPS Take 2 capsules by mouth daily. (Patient not taking: Reported on 04/11/2023)     No current facility-administered medications on file prior to visit.    Past Surgical History:  Procedure Laterality Date   AUGMENTATION MAMMAPLASTY Bilateral    BREAST BIOPSY Right 03/11/2023   [place] RT BREAST BX W LOC DEV 1ST LESION IMG BX SPEC  US GUIDE 03/11/2023 [facility]MO[facility]AP[facility]   BREAST RECONSTRUCTION WITH PLACEMENT OF TISSUE EXPANDER AND ALLODERM Bilateral 04/02/2023   Procedure: BREAST RECONSTRUCTION WITH PLACEMENT OF TISSUE EXPANDER AND ALLODERM POSSIBLE SILICONE IMPLANT;  Surgeon: Glenna Fellows, MD;  Location: [facility];  Service: Plastics;  Laterality: Bilateral;   BREAST SURGERY     augmentation   ELBOW SURGERY     HARDWARE REMOVAL Right 02/01/2015   Procedure: RIGHT ELBOW HARDWARE REMOVAL;  Surgeon: Teryl Lucy, MD;  Location: [facility];  Service: Orthopedics;  Laterality: Right;   NIPPLE SPARING MASTECTOMY Right 04/02/2023   Procedure: RIGHT NIPPLE SPARING MASTECTOMY;  Surgeon: Harriette Bouillon, MD;  Location: [facility];  Service: General;   Laterality: Right;   SIMPLE MASTECTOMY WITH AXILLARY SENTINEL NODE BIOPSY Left 04/02/2023   Procedure: LEFT RISK REDUCING MASTECTOMY;  Surgeon: Harriette Bouillon, MD;  Location: [facility];  Service: General;  Laterality: Left;   TONSILLECTOMY     TONSILLECTOMY     WRIST ARTHROSCOPY      Allergies  Allergen Reactions   Chlorhexidine Itching and Rash    BP 122/76   Ht 5' 4.5" (1.638 m)   Wt 124 lb (56.2 kg)   BMI 20.96 kg/m       No data to display              No data to display              Objective:  Physical Exam:  Gen: NAD, comfortable in exam room  Right hip: No deformity. Full range of motion with 5/5 strength. Tenderness to palpation over iliopsoas tendon anteriorly. Neurovascularly intact distally. Negative logroll Negative faber, fadir, and piriformis stretches. Mild tightness with hip flexor stretch.   Assessment & Plan:  1. Right hip pain - consistent with hip flexor strain mainly on right.  No red flags.  No palpable cords and this is more lateral than would expect with a DVT (and she's been taking eliquis making that unlikely).  Home exercises and stretches reviewed.  Heat, tylenol.  Follow up in 6 weeks.  Consider formal physical therapy.

## 2023-06-04 ENCOUNTER — Other Ambulatory Visit: Payer: Managed Care, Other (non HMO)

## 2023-06-08 ENCOUNTER — Inpatient Hospital Stay (HOSPITAL_BASED_OUTPATIENT_CLINIC_OR_DEPARTMENT_OTHER): Payer: Managed Care, Other (non HMO) | Admitting: Hematology and Oncology

## 2023-06-08 ENCOUNTER — Telehealth: Payer: Self-pay | Admitting: Adult Health

## 2023-06-08 ENCOUNTER — Ambulatory Visit (HOSPITAL_COMMUNITY)
Admission: RE | Admit: 2023-06-08 | Discharge: 2023-06-08 | Disposition: A | Discharge status: Home / Self Care | Source: Ambulatory Visit | Attending: Hematology and Oncology | Admitting: Hematology and Oncology

## 2023-06-08 ENCOUNTER — Encounter: Payer: Self-pay | Admitting: Hematology and Oncology

## 2023-06-08 DIAGNOSIS — I2699 Other pulmonary embolism without acute cor pulmonale: Secondary | ICD-10-CM | POA: Insufficient documentation

## 2023-06-08 DIAGNOSIS — Z1502 Genetic susceptibility to malignant neoplasm of ovary: Secondary | ICD-10-CM | POA: Diagnosis not present

## 2023-06-08 DIAGNOSIS — Z1509 Genetic susceptibility to other malignant neoplasm: Secondary | ICD-10-CM

## 2023-06-08 DIAGNOSIS — C50919 Malignant neoplasm of unspecified site of unspecified female breast: Secondary | ICD-10-CM | POA: Diagnosis not present

## 2023-06-08 DIAGNOSIS — Z1589 Genetic susceptibility to other disease: Secondary | ICD-10-CM

## 2023-06-08 MED ORDER — IOHEXOL 350 MG/ML SOLN
75.0000 mL | Freq: Once | INTRAVENOUS | Status: AC | PRN
  Administered 2023-06-08: 75 mL via INTRAVENOUS

## 2023-06-08 NOTE — Progress Notes (Signed)
 HEMATOLOGY-ONCOLOGY TELEPHONE VISIT PROGRESS NOTE  I connected with our patient on 06/08/23 at  3:15 PM EDT by telephone and verified that I am speaking with the correct person using two identifiers.  I discussed the limitations, risks, security and privacy concerns of performing an evaluation and management service by telephone and the availability of in person appointments.  I also discussed with the patient that there may be a patient responsible charge related to this service. The patient expressed understanding and agreed to proceed.   History of Present Illness: Follow-up of pulmonary embolism on anticoagulation, complaining of some chest discomfort  History of Present Illness The patient, with a history of a blood clot, is eager to discontinue anticoagulation therapy in preparation for an upcoming surgery. She has been on anticoagulation therapy since January 6th. She reports no symptoms of a blood clot, and a recent D-dimer test was negative. The patient is also concerned about an abnormal anion gap result from recent blood work, but she has no symptoms related to this finding. Because of ongoing slight chest discomfort we decided to perform a stat CT chest today.    REVIEW OF SYSTEMS:   Constitutional: Denies fevers, chills or abnormal weight loss All other systems were reviewed with the patient and are negative. Observations/Objective:     Assessment Plan:  CHEK2-related breast cancer (HCC) [facility] my risk genetic test: May 2024: CHEK2 mutation (risk of breast cancer 55.6%) Based on our risk calculation the risk of breast cancer is between 24 to 32%   03/08/2023: Breast MRI: 1.1 cm right breast mass, 4 cm non-mass enhancement 03/11/2023: Right breast biopsy: Intermediate grade DCIS ER 90%, PR 90%   04/02/2023: Bilateral mastectomies Left mastectomy: Benign Right mastectomy: High-grade DCIS cribriform type with necrosis, 2 foci, negative for invasive cancer, 1.2 cm and 1.5 cm,  margins negative, ER 90%, PR 90% ------------------------------------------------------------------------- Treatment plan: No role of radiation or antiestrogen therapy.   Breast cancer surveillance: No role of mammograms since she had bilateral mastectomies Chest exam 06/08/2023: Benign  Pulmonary embolism: Currently on Eliquis.  D-dimer negative.    Chest discomfort: Stat CT chest has been ordered for today.  I will call her tomorrow to discuss results. If the CT chest is negative for PE then we will discontinue anticoagulation.  Anxiety: Currently on Wellbutrin  --------------------------------- Assessment and Plan Assessment & Plan Other acute pulmonary embolism without acute cor pulmonale D-dimer negative, low probability of embolism. CT scan needed to confirm absence. She wishes to stop anticoagulation before surgery on March 28th. Apixaban can be stopped if CT confirms no embolism. - Order stat CT scan to confirm absence of pulmonary embolism. - Schedule follow-up call to discuss CT results and potential discontinuation of apixaban.        I discussed the assessment and treatment plan with the patient. The patient was provided an opportunity to ask questions and all were answered. The patient agreed with the plan and demonstrated an understanding of the instructions. The patient was advised to call back or seek an in-person evaluation if the symptoms worsen or if the condition fails to improve as anticipated.   I provided 20 minutes of non-face-to-face time during this encounter.  This includes time for charting and coordination of care   Tamsen Meek, MD

## 2023-06-08 NOTE — Assessment & Plan Note (Signed)
 [facility] my risk genetic test: May 2024: CHEK2 mutation (risk of breast cancer 55.6%) Based on our risk calculation the risk of breast cancer is between 24 to 32%   03/08/2023: Breast MRI: 1.1 cm right breast mass, 4 cm non-mass enhancement 03/11/2023: Right breast biopsy: Intermediate grade DCIS ER 90%, PR 90%   04/02/2023: Bilateral mastectomies Left mastectomy: Benign Right mastectomy: High-grade DCIS cribriform type with necrosis, 2 foci, negative for invasive cancer, 1.2 cm and 1.5 cm, margins negative, ER 90%, PR 90% ------------------------------------------------------------------------- Treatment plan: No role of radiation or antiestrogen therapy.   Breast cancer surveillance: No role of mammograms since she had bilateral mastectomies Chest exam 06/08/2023: Benign  Pulmonary embolism: Currently on Eliquis.  D-dimer negative.  We will obtain a CT chest and if that is negative then she can discontinue anticoagulation.  Anxiety: Currently on Wellbutrin

## 2023-06-09 ENCOUNTER — Other Ambulatory Visit: Payer: Self-pay | Admitting: *Deleted

## 2023-06-09 ENCOUNTER — Telehealth: Payer: Self-pay | Admitting: Hematology and Oncology

## 2023-06-09 ENCOUNTER — Encounter: Payer: Self-pay | Admitting: *Deleted

## 2023-06-09 NOTE — Progress Notes (Signed)
 Per MD pt CT Angio negative and pt can discontinue Eliquis at this time.  RN sent mychart message to pt who verbalized understanding.

## 2023-06-09 NOTE — Telephone Encounter (Signed)
 Called to get the patient scheduled per 3/11 los to review recent scan. Patient states she got a Clinical cytogeneticist message from Dr.Gudena's nurse with the results of the scan. No additional appointments have been added at this time.

## 2023-06-10 NOTE — H&P (Signed)
 Subjective Patient ID: Kristina King is a 48 y.o. female.     HPI   10 weeks post op. Patient developed SOB on POD#3 and was diagnosed with PE. Eliquis discontinued this week following CT angio no residual PE. Patient scheduled for implant exchange later this month.     Patient's mother and MGM with breast ca. Patient obtained genetic testing earlier this year demonstrating CHEK2 mutation. MMG 09/2022 BIRAD1. Patient had been in process of starting tamoxifen for risk reduction when screening MRI 02/2023 demonstrated 1.1 cm right UOQ mass and right UOQ NME 4 x 1.5 x 1.5 cm. US showed masslike area in the right breast at 10 o'clock measuring 5 cm, felt to correspond to the NME on MRI. This extends to implant capsule. No sonographic correlate for the mass identified on MRI. US guided biopsy labeled right breast 10 o clock showed intermediate grade DCIS ER/PR+. MR guided biopsy labeled right breast outer showed microscopic focus IDC with DCIS.    Patient elected for bilateral mastectomies. Final pathology 1.2 cm and 1.5 cm foci of high grade DCIS margins clear, no invasive carcinoma, 0/1 SLN.   Patient underwent augmentation mammaplasty with Dr. Benna Dunks in 2009 submuscular saline. Prior implants Mentor smooth round moderate profile 325 ml implants, fill volume 350 ml REF [id number] bilateral.   Prior to augmentation"little B." Prior to mastectomies C cup, happy with this volume. 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 Lives with spouse that accompanies her and has two kids in college. Older is at [employer] and will graduate in May, younger at [employer].     Review of Systems   Objective Physical Exam  Cardiovascular: Normal rate and regular rhythm.    Pulmonary/Chest Effort normal and breath sounds normal.    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   Reviewed saline vs silicone, shaped vs round, textured vs smooth. Recommend HCG or capacity filled silicone implants as they may offer reduced risk visible rippling. Reviewed MRI or [place] surveillance for rupture with silicone implants. Reviewed risks BIA ALCL with textured devices, new reports SCC implant associated with breast implants. Reviewed shared risks rupture contracture need for additional surgery. Counseled implants are not permanent devices. Reviewed size in part guided by width chest, cannot assure her cup size.  Patient has elected for silicone. Plan smooth round. Completed Eugenie Filler physician patient checklist. Reviewed OP surgery no drains anticipated.   Reviewed fat grafting purpose. Reviewed variable take fat, may need to repeat, fat necrosis that presents as masses. Patient has minimal donor site for this and if pursues would plan flanks and bilateral medial and lateral thighs. Reviewed incisions and compression regarding this. Patient has significant concerns regarding additional scars pain of this for minimal change. I agree that she the amount of fat that can be harvested from her and the expected loss of graft may be small change. Counseled I anticipate she will have some degree of rippling no matter if she  pursues this on not given the thinness of her mastectomy flaps. Plan defer this and discussed can pursue in future if desired.  We discussed asymmetry NAC position. Recommend place implant alone and not try "mastopexy" and additional scars of this. Reviewed in  some scenarios can remove NAC and skin graft and place in desired position over implant. Patient states this is a priority for her to try to correct and asks that I try to do anything that can be done to improve this.   Patient asking during surgery if can remove skin from upper abdomen. Counseled reverse abdominoplasty in my opinion would have small benefit for her aesthetically with larger scars. Plan revision of the depressed lateral drain scars bilateral.    Additional risks including but not limited to bleeding, hematoma, seroma, asymmetry, unacceptable cosmetic result, damage to adjacent structures, blood clots in legs or lungs reviewed.   Rx for Bactrim given. Patient decline HCG wash she did preop as reports developed rash. She plans to use antibacterial Dial which does not cause problems for her.    Natrelle 133S FV-12-T 400 ml tissue expanders placed fill volume 340 ml saline bilateral  Glenna Fellows, MD [facility] Plastic & Reconstructive Surgery  Office/ physician access line after hours [phone]

## 2023-06-17 ENCOUNTER — Other Ambulatory Visit: Payer: Self-pay

## 2023-06-17 ENCOUNTER — Encounter (HOSPITAL_BASED_OUTPATIENT_CLINIC_OR_DEPARTMENT_OTHER): Payer: Self-pay | Admitting: Plastic Surgery

## 2023-06-24 NOTE — Anesthesia Preprocedure Evaluation (Signed)
 Anesthesia Evaluation  Patient identified by MRN, date of birth, ID band Patient awake    Reviewed: Allergy & Precautions, NPO status , Patient's Chart, lab work & pertinent test results  History of Anesthesia Complications Negative for: history of anesthetic complications  Airway Mallampati: I  TM Distance: >3 FB Neck ROM: Full    Dental no notable dental hx. (+) Dental Advisory Given, Teeth Intact   Pulmonary former smoker   Pulmonary exam normal breath sounds clear to auscultation       Cardiovascular negative cardio ROS Normal cardiovascular exam Rhythm:Regular Rate:Normal     Neuro/Psych  Headaches  Anxiety Depression       GI/Hepatic Neg liver ROS,GERD  Controlled,,  Endo/Other  negative endocrine ROS    Renal/GU negative Renal ROS     Musculoskeletal negative musculoskeletal ROS (+)    Abdominal   Peds  Hematology negative hematology ROS (+)   Anesthesia Other Findings DUCTAL CARCINOMA OF RIGHT BREAST  Reproductive/Obstetrics negative OB ROS                              Anesthesia Physical Anesthesia Plan  ASA: 2  Anesthesia Plan: General   Post-op Pain Management: Tylenol PO (pre-op)*, Gabapentin PO (pre-op)* and Celebrex PO (pre-op)*   Induction: Intravenous  PONV Risk Score and Plan: 4 or greater and Treatment may vary due to age or medical condition, Ondansetron, Dexamethasone, Midazolam, Propofol infusion and TIVA  Airway Management Planned: Oral ETT  Additional Equipment: None  Intra-op Plan:   Post-operative Plan: Extubation in OR  Informed Consent: I have reviewed the patients History and Physical, chart, labs and discussed the procedure including the risks, benefits and alternatives for the proposed anesthesia with the patient or authorized representative who has indicated his/her understanding and acceptance.     Dental advisory given  Plan Discussed  with: CRNA  Anesthesia Plan Comments: (Discussed hx of PE's and scopolamine causing pupillary dilation after last surgery. Plan TIVA and avoid scopolamine. )        Anesthesia Quick Evaluation

## 2023-06-25 ENCOUNTER — Ambulatory Visit (HOSPITAL_BASED_OUTPATIENT_CLINIC_OR_DEPARTMENT_OTHER): Payer: Self-pay | Admitting: Anesthesiology

## 2023-06-25 ENCOUNTER — Other Ambulatory Visit: Payer: Self-pay

## 2023-06-25 ENCOUNTER — Ambulatory Visit (HOSPITAL_BASED_OUTPATIENT_CLINIC_OR_DEPARTMENT_OTHER)
Admission: RE | Admit: 2023-06-25 | Discharge: 2023-06-25 | Disposition: A | Discharge status: Home / Self Care | Payer: Managed Care, Other (non HMO) | Attending: Plastic Surgery | Admitting: Plastic Surgery

## 2023-06-25 ENCOUNTER — Encounter (HOSPITAL_BASED_OUTPATIENT_CLINIC_OR_DEPARTMENT_OTHER): Payer: Self-pay | Admitting: Plastic Surgery

## 2023-06-25 ENCOUNTER — Encounter (HOSPITAL_BASED_OUTPATIENT_CLINIC_OR_DEPARTMENT_OTHER)
Admission: RE | Disposition: A | Discharge status: Home / Self Care | Payer: Self-pay | Source: Home / Self Care | Attending: Plastic Surgery

## 2023-06-25 DIAGNOSIS — Z803 Family history of malignant neoplasm of breast: Secondary | ICD-10-CM | POA: Insufficient documentation

## 2023-06-25 DIAGNOSIS — Z7901 Long term (current) use of anticoagulants: Secondary | ICD-10-CM | POA: Diagnosis not present

## 2023-06-25 DIAGNOSIS — Z87891 Personal history of nicotine dependence: Secondary | ICD-10-CM | POA: Insufficient documentation

## 2023-06-25 DIAGNOSIS — F419 Anxiety disorder, unspecified: Secondary | ICD-10-CM | POA: Diagnosis not present

## 2023-06-25 DIAGNOSIS — Z08 Encounter for follow-up examination after completed treatment for malignant neoplasm: Secondary | ICD-10-CM | POA: Insufficient documentation

## 2023-06-25 DIAGNOSIS — Z853 Personal history of malignant neoplasm of breast: Secondary | ICD-10-CM | POA: Insufficient documentation

## 2023-06-25 DIAGNOSIS — Z9013 Acquired absence of bilateral breasts and nipples: Secondary | ICD-10-CM | POA: Insufficient documentation

## 2023-06-25 DIAGNOSIS — K219 Gastro-esophageal reflux disease without esophagitis: Secondary | ICD-10-CM | POA: Diagnosis not present

## 2023-06-25 DIAGNOSIS — Z1501 Genetic susceptibility to malignant neoplasm of breast: Secondary | ICD-10-CM | POA: Diagnosis not present

## 2023-06-25 DIAGNOSIS — Z01818 Encounter for other preprocedural examination: Secondary | ICD-10-CM

## 2023-06-25 DIAGNOSIS — Z86711 Personal history of pulmonary embolism: Secondary | ICD-10-CM | POA: Diagnosis not present

## 2023-06-25 DIAGNOSIS — F32A Depression, unspecified: Secondary | ICD-10-CM | POA: Insufficient documentation

## 2023-06-25 HISTORY — PX: REMOVAL OF BILATERAL TISSUE EXPANDERS WITH PLACEMENT OF BILATERAL BREAST IMPLANTS: SHX6431

## 2023-06-25 LAB — POCT PREGNANCY, URINE: Preg Test, Ur: NEGATIVE

## 2023-06-25 SURGERY — REMOVAL, TISSUE EXPANDER, BREAST, BILATERAL, WITH BILATERAL IMPLANT IMPLANT INSERTION
Anesthesia: General | Site: Chest | Laterality: Bilateral

## 2023-06-25 MED ORDER — CEFAZOLIN SODIUM-DEXTROSE 2-4 GM/100ML-% IV SOLN
INTRAVENOUS | Status: AC
  Filled 2023-06-25: qty 100

## 2023-06-25 MED ORDER — MIDAZOLAM HCL 5 MG/5ML IJ SOLN
INTRAMUSCULAR | Status: DC | PRN
  Administered 2023-06-25: 2 mg via INTRAVENOUS

## 2023-06-25 MED ORDER — SUGAMMADEX SODIUM 200 MG/2ML IV SOLN
INTRAVENOUS | Status: DC | PRN
Start: 2023-06-25 — End: 2023-06-25
  Administered 2023-06-25: 125 mg via INTRAVENOUS

## 2023-06-25 MED ORDER — OXYCODONE HCL 5 MG PO TABS
ORAL_TABLET | ORAL | Status: AC
Start: 2023-06-25 — End: ?
  Filled 2023-06-25: qty 1

## 2023-06-25 MED ORDER — OXYCODONE HCL 5 MG PO TABS
5.0000 mg | ORAL_TABLET | Freq: Once | ORAL | Status: AC | PRN
  Administered 2023-06-25: 5 mg via ORAL

## 2023-06-25 MED ORDER — SODIUM CHLORIDE 0.9 % IV SOLN
INTRAVENOUS | Status: DC | PRN
  Administered 2023-06-25: 620 mL

## 2023-06-25 MED ORDER — 0.9 % SODIUM CHLORIDE (POUR BTL) OPTIME
TOPICAL | Status: DC | PRN
  Administered 2023-06-25: 1000 mL

## 2023-06-25 MED ORDER — DROPERIDOL 2.5 MG/ML IJ SOLN: 0.6250 mg | Freq: Once | INTRAMUSCULAR | Status: DC | PRN

## 2023-06-25 MED ORDER — PROPOFOL 500 MG/50ML IV EMUL
INTRAVENOUS | Status: AC
  Filled 2023-06-25: qty 50

## 2023-06-25 MED ORDER — ACETAMINOPHEN 500 MG PO TABS
ORAL_TABLET | ORAL | Status: AC
  Filled 2023-06-25: qty 2

## 2023-06-25 MED ORDER — ONDANSETRON HCL 4 MG/2ML IJ SOLN
INTRAMUSCULAR | Status: DC | PRN
  Administered 2023-06-25: 4 mg via INTRAVENOUS

## 2023-06-25 MED ORDER — ACETAMINOPHEN 500 MG PO TABS
1000.0000 mg | ORAL_TABLET | Freq: Once | ORAL | Status: AC
  Administered 2023-06-25: 1000 mg via ORAL

## 2023-06-25 MED ORDER — GABAPENTIN 300 MG PO CAPS
ORAL_CAPSULE | ORAL | Status: AC
  Filled 2023-06-25: qty 1

## 2023-06-25 MED ORDER — SODIUM CHLORIDE (PF) 0.9 % IJ SOLN
INTRAMUSCULAR | Status: AC
  Filled 2023-06-25: qty 100

## 2023-06-25 MED ORDER — BUPIVACAINE LIPOSOME 1.3 % IJ SUSP
INTRAMUSCULAR | Status: AC
  Filled 2023-06-25: qty 20

## 2023-06-25 MED ORDER — BUPIVACAINE HCL (PF) 0.5 % IJ SOLN
INTRAMUSCULAR | Status: DC | PRN
  Administered 2023-06-25: 30 mL

## 2023-06-25 MED ORDER — MEPERIDINE HCL 25 MG/ML IJ SOLN: 6.2500 mg | INTRAMUSCULAR | Status: DC | PRN

## 2023-06-25 MED ORDER — DEXAMETHASONE SODIUM PHOSPHATE 10 MG/ML IJ SOLN
INTRAMUSCULAR | Status: AC
  Filled 2023-06-25: qty 1

## 2023-06-25 MED ORDER — CELECOXIB 200 MG PO CAPS
ORAL_CAPSULE | ORAL | Status: AC
  Filled 2023-06-25: qty 1

## 2023-06-25 MED ORDER — PROPOFOL 10 MG/ML IV BOLUS
INTRAVENOUS | Status: AC
  Filled 2023-06-25: qty 20

## 2023-06-25 MED ORDER — SODIUM CHLORIDE 0.9 % IV SOLN
INTRAVENOUS | Status: AC
  Filled 2023-06-25: qty 10

## 2023-06-25 MED ORDER — GABAPENTIN 300 MG PO CAPS: 300.0000 mg | ORAL_CAPSULE | ORAL | Status: AC

## 2023-06-25 MED ORDER — DEXAMETHASONE SODIUM PHOSPHATE 10 MG/ML IJ SOLN
INTRAMUSCULAR | Status: DC | PRN
  Administered 2023-06-25: 10 mg via INTRAVENOUS

## 2023-06-25 MED ORDER — LIDOCAINE HCL (CARDIAC) PF 100 MG/5ML IV SOSY
PREFILLED_SYRINGE | INTRAVENOUS | Status: DC | PRN
  Administered 2023-06-25: 50 mg via INTRAVENOUS

## 2023-06-25 MED ORDER — PROPOFOL 500 MG/50ML IV EMUL
INTRAVENOUS | Status: DC | PRN
Start: 2023-06-25 — End: 2023-06-25
  Administered 2023-06-25: 150 ug/kg/min via INTRAVENOUS

## 2023-06-25 MED ORDER — MIDAZOLAM HCL 2 MG/2ML IJ SOLN
INTRAMUSCULAR | Status: AC
  Filled 2023-06-25: qty 2

## 2023-06-25 MED ORDER — ACETAMINOPHEN 500 MG PO TABS: 1000.0000 mg | ORAL_TABLET | ORAL | Status: AC

## 2023-06-25 MED ORDER — PHENYLEPHRINE HCL (PRESSORS) 10 MG/ML IV SOLN
INTRAVENOUS | Status: DC | PRN
  Administered 2023-06-25: 120 ug via INTRAVENOUS
  Administered 2023-06-25: 80 ug via INTRAVENOUS

## 2023-06-25 MED ORDER — PHENYLEPHRINE 80 MCG/ML (10ML) SYRINGE FOR IV PUSH (FOR BLOOD PRESSURE SUPPORT)
PREFILLED_SYRINGE | INTRAVENOUS | Status: AC
  Filled 2023-06-25: qty 10

## 2023-06-25 MED ORDER — HYDROMORPHONE HCL 1 MG/ML IJ SOLN
0.2500 mg | INTRAMUSCULAR | Status: DC | PRN
  Administered 2023-06-25 (×2): 0.25 mg via INTRAVENOUS
  Administered 2023-06-25: 0.5 mg via INTRAVENOUS

## 2023-06-25 MED ORDER — BUPIVACAINE HCL (PF) 0.5 % IJ SOLN
INTRAMUSCULAR | Status: AC
  Filled 2023-06-25: qty 30

## 2023-06-25 MED ORDER — KETAMINE HCL 50 MG/5ML IJ SOSY
PREFILLED_SYRINGE | INTRAMUSCULAR | Status: AC
  Filled 2023-06-25: qty 5

## 2023-06-25 MED ORDER — FENTANYL CITRATE (PF) 100 MCG/2ML IJ SOLN
INTRAMUSCULAR | Status: AC
  Filled 2023-06-25: qty 2

## 2023-06-25 MED ORDER — ROCURONIUM BROMIDE 10 MG/ML (PF) SYRINGE
PREFILLED_SYRINGE | INTRAVENOUS | Status: AC
  Filled 2023-06-25: qty 10

## 2023-06-25 MED ORDER — GABAPENTIN 300 MG PO CAPS
300.0000 mg | ORAL_CAPSULE | Freq: Once | ORAL | Status: AC
  Administered 2023-06-25: 300 mg via ORAL

## 2023-06-25 MED ORDER — LACTATED RINGERS IV SOLN: INTRAVENOUS | Status: DC

## 2023-06-25 MED ORDER — FENTANYL CITRATE (PF) 100 MCG/2ML IJ SOLN
INTRAMUSCULAR | Status: DC | PRN
  Administered 2023-06-25 (×2): 50 ug via INTRAVENOUS

## 2023-06-25 MED ORDER — ONDANSETRON HCL 4 MG/2ML IJ SOLN
INTRAMUSCULAR | Status: AC
  Filled 2023-06-25: qty 2

## 2023-06-25 MED ORDER — LIDOCAINE 2% (20 MG/ML) 5 ML SYRINGE
INTRAMUSCULAR | Status: AC
  Filled 2023-06-25: qty 5

## 2023-06-25 MED ORDER — CEFAZOLIN SODIUM-DEXTROSE 2-4 GM/100ML-% IV SOLN
2.0000 g | INTRAVENOUS | Status: AC
  Administered 2023-06-25: 2 g via INTRAVENOUS

## 2023-06-25 MED ORDER — HYDROMORPHONE HCL 1 MG/ML IJ SOLN
INTRAMUSCULAR | Status: AC
  Filled 2023-06-25: qty 0.5

## 2023-06-25 MED ORDER — OXYCODONE HCL 5 MG/5ML PO SOLN: 5.0000 mg | Freq: Once | ORAL | Status: AC | PRN

## 2023-06-25 MED ORDER — BUPIVACAINE HCL (PF) 0.25 % IJ SOLN
INTRAMUSCULAR | Status: AC
  Filled 2023-06-25: qty 60

## 2023-06-25 MED ORDER — ROCURONIUM BROMIDE 100 MG/10ML IV SOLN
INTRAVENOUS | Status: DC | PRN
  Administered 2023-06-25: 20 mg via INTRAVENOUS
  Administered 2023-06-25: 50 mg via INTRAVENOUS

## 2023-06-25 MED ORDER — POVIDONE-IODINE 10 % EX SOLN
CUTANEOUS | Status: DC | PRN
  Administered 2023-06-25: 1 via TOPICAL

## 2023-06-25 MED ORDER — PROPOFOL 10 MG/ML IV BOLUS
INTRAVENOUS | Status: DC | PRN
  Administered 2023-06-25: 160 mg via INTRAVENOUS

## 2023-06-25 MED ORDER — CELECOXIB 200 MG PO CAPS
200.0000 mg | ORAL_CAPSULE | ORAL | Status: AC
  Administered 2023-06-25: 200 mg via ORAL

## 2023-06-25 SURGICAL SUPPLY — 60 items
BAG DECANTER FOR FLEXI CONT (MISCELLANEOUS) ×1 IMPLANT
BINDER BREAST 3XL (GAUZE/BANDAGES/DRESSINGS) IMPLANT
BINDER BREAST LRG (GAUZE/BANDAGES/DRESSINGS) IMPLANT
BINDER BREAST MEDIUM (GAUZE/BANDAGES/DRESSINGS) IMPLANT
BINDER BREAST XLRG (GAUZE/BANDAGES/DRESSINGS) IMPLANT
BINDER BREAST XXLRG (GAUZE/BANDAGES/DRESSINGS) IMPLANT
BLADE SURG 10 STRL SS (BLADE) ×2 IMPLANT
BNDG GAUZE DERMACEA FLUFF 4 (GAUZE/BANDAGES/DRESSINGS) ×2 IMPLANT
CANISTER SUCT 1200ML W/VALVE (MISCELLANEOUS) ×1 IMPLANT
CHLORAPREP W/TINT 26 (MISCELLANEOUS) ×2 IMPLANT
COVER BACK TABLE 60X90IN (DRAPES) ×1 IMPLANT
COVER MAYO STAND STRL (DRAPES) ×1 IMPLANT
DERMABOND ADVANCED .7 DNX12 (GAUZE/BANDAGES/DRESSINGS) ×2 IMPLANT
DRAIN CHANNEL 15F RND FF W/TCR (WOUND CARE) IMPLANT
DRAPE TOP ARMCOVERS (MISCELLANEOUS) ×1 IMPLANT
DRAPE U-SHAPE 76X120 STRL (DRAPES) ×1 IMPLANT
DRAPE UTILITY XL STRL (DRAPES) ×1 IMPLANT
DURAPREP 26ML APPLICATOR (WOUND CARE) IMPLANT
ELECT BLADE 4.0 EZ CLEAN MEGAD (MISCELLANEOUS) IMPLANT
ELECT COATED BLADE 2.86 ST (ELECTRODE) ×1 IMPLANT
ELECT REM PT RETURN 9FT ADLT (ELECTROSURGICAL) ×1 IMPLANT
ELECTRODE BLDE 4.0 EZ CLN MEGD (MISCELLANEOUS) IMPLANT
ELECTRODE REM PT RTRN 9FT ADLT (ELECTROSURGICAL) ×1 IMPLANT
EVACUATOR SILICONE 100CC (DRAIN) IMPLANT
GAUZE PAD ABD 8X10 STRL (GAUZE/BANDAGES/DRESSINGS) ×2 IMPLANT
GLOVE BIO SURGEON STRL SZ 6 (GLOVE) ×2 IMPLANT
GOWN STRL REUS W/ TWL LRG LVL3 (GOWN DISPOSABLE) ×2 IMPLANT
IMPL BREAST P6.1XRND XFULL 470 (Breast) IMPLANT
IMPL BRST P6.1XRND XFULL 470CC (Breast) ×2 IMPLANT
KIT FILL ASEPTIC TRANSFER (MISCELLANEOUS) IMPLANT
MARKER SKIN DUAL TIP RULER LAB (MISCELLANEOUS) IMPLANT
NDL FILTER BLUNT 18X1 1/2 (NEEDLE) IMPLANT
NDL HYPO 25X1 1.5 SAFETY (NEEDLE) IMPLANT
NEEDLE FILTER BLUNT 18X1 1/2 (NEEDLE) IMPLANT
NEEDLE HYPO 25X1 1.5 SAFETY (NEEDLE) ×1 IMPLANT
PACK BASIN DAY SURGERY FS (CUSTOM PROCEDURE TRAY) ×1 IMPLANT
PENCIL SMOKE EVACUATOR (MISCELLANEOUS) ×1 IMPLANT
PIN SAFETY STERILE (MISCELLANEOUS) IMPLANT
SHEET MEDIUM DRAPE 40X70 STRL (DRAPES) ×1 IMPLANT
SIZER BREAST 495CC (SIZER) ×1 IMPLANT
SIZER BRST 495CC (SIZER) IMPLANT
SIZER BRST P5.3X 450CC (SIZER) IMPLANT
SIZER BRST P6.1X 470CC (SIZER) IMPLANT
SLEEVE SCD COMPRESS KNEE MED (STOCKING) ×1 IMPLANT
SPIKE FLUID TRANSFER (MISCELLANEOUS) IMPLANT
SPONGE T-LAP 18X18 ~~LOC~~+RFID (SPONGE) ×1 IMPLANT
STAPLER SKIN PROX WIDE 3.9 (STAPLE) ×1 IMPLANT
SUT ETHILON 2 0 FS 18 (SUTURE) IMPLANT
SUT MNCRL AB 4-0 PS2 18 (SUTURE) IMPLANT
SUT PDS AB 2-0 CT2 27 (SUTURE) IMPLANT
SUT VIC AB 3-0 PS1 18XBRD (SUTURE) IMPLANT
SUT VIC AB 3-0 SH 27X BRD (SUTURE) IMPLANT
SUT VIC AB 4-0 PS2 18 (SUTURE) IMPLANT
SYR 20ML LL LF (SYRINGE) IMPLANT
SYR BULB IRRIG 60ML STRL (SYRINGE) ×2 IMPLANT
SYR CONTROL 10ML LL (SYRINGE) IMPLANT
TOWEL GREEN STERILE FF (TOWEL DISPOSABLE) ×2 IMPLANT
TUBE CONNECTING 20X1/4 (TUBING) ×1 IMPLANT
UNDERPAD 30X36 HEAVY ABSORB (UNDERPADS AND DIAPERS) ×2 IMPLANT
YANKAUER SUCT BULB TIP NO VENT (SUCTIONS) ×1 IMPLANT

## 2023-06-25 NOTE — Transfer of Care (Signed)
 Immediate Anesthesia Transfer of Care Note  Patient: Kristina King  Procedure(s) Performed: REMOVAL, TISSUE EXPANDER, BREAST, BILATERAL, WITH BILATERAL IMPLANT IMPLANT INSERTION; BILATERAL SCAR REVISION (Bilateral: Chest)  Patient Location: PACU  Anesthesia Type:General  Level of Consciousness: drowsy and patient cooperative  Airway & Oxygen Therapy: Patient Spontanous Breathing and Patient connected to face mask oxygen  Post-op Assessment: Report given to RN and Post -op Vital signs reviewed and stable  Post vital signs: Reviewed and stable  Last Vitals:  Vitals Value Taken Time  BP 97/80 06/25/23 0938  Temp    Pulse 65 06/25/23 0940  Resp 16 06/25/23 0940  SpO2 100 % 06/25/23 0940  Vitals shown include unfiled device data.  Last Pain:  Vitals:   06/25/23 0634  TempSrc: Temporal  PainSc: 0-No pain         Complications: No notable events documented.

## 2023-06-25 NOTE — Interval H&P Note (Signed)
 History and Physical Interval Note:  06/25/2023 7:06 AM  Kristina King  has presented today for surgery, with the diagnosis of History DCIS CHEK2 mutation, acquired absence breasts.  The various methods of treatment have been discussed with the patient and family. After consideration of risks, benefits and other options for treatment, the patient has consented to  removal bilateral chest tissue expanders placement silicone implants scar revision bilateral chest as a surgical intervention.  The patient's history has been reviewed, patient examined, no change in status, stable for surgery.  I have reviewed the patient's chart and labs.  Questions were answered to the patient's satisfaction.     Kristina King Kristina King

## 2023-06-25 NOTE — Discharge Instructions (Addendum)
  Post Anesthesia Home Care Instructions  Activity: Get plenty of rest for the remainder of the day. A responsible individual must stay with you for 24 hours following the procedure.  For the next 24 hours, DO NOT: -Drive a car -Advertising copywriter -Drink alcoholic beverages -Take any medication unless instructed by your physician -Make any legal decisions or sign important papers.  Meals: Start with liquid foods such as gelatin or soup. Progress to regular foods as tolerated. Avoid greasy, spicy, heavy foods. If nausea and/or vomiting occur, drink only clear liquids until the nausea and/or vomiting subsides. Call your physician if vomiting continues.  Special Instructions/Symptoms: Your throat may feel dry or sore from the anesthesia or the breathing tube placed in your throat during surgery. If this causes discomfort, gargle with warm salt water. The discomfort should disappear within 24 hours.    May have tylenol again after 1030 Ibuprofen after 1230

## 2023-06-25 NOTE — Anesthesia Postprocedure Evaluation (Signed)
 Anesthesia Post Note  Patient: Kristina King  Procedure(s) Performed: REMOVAL, TISSUE EXPANDER, BREAST, BILATERAL, WITH BILATERAL IMPLANT IMPLANT INSERTION; BILATERAL SCAR REVISION (Bilateral: Chest)     Patient location during evaluation: PACU Anesthesia Type: General Level of consciousness: sedated and patient cooperative Pain management: pain level controlled Vital Signs Assessment: post-procedure vital signs reviewed and stable Respiratory status: spontaneous breathing Cardiovascular status: stable Anesthetic complications: no   No notable events documented.  Last Vitals:  Vitals:   06/25/23 1045 06/25/23 1055  BP: 110/77 114/80  Pulse: 67 68  Resp: 14 16  Temp:  36.6 C  SpO2: 95% 96%    Last Pain:  Vitals:   06/25/23 1105  TempSrc:   PainSc: 4                  Kristina King

## 2023-06-25 NOTE — Anesthesia Procedure Notes (Signed)
 Procedure Name: Intubation Date/Time: 06/25/2023 7:30 AM  Performed by: Thornell Mule, CRNAPre-anesthesia Checklist: Patient identified, Emergency Drugs available, Suction available and Patient being monitored Patient Re-evaluated:Patient Re-evaluated prior to induction Oxygen Delivery Method: Circle system utilized Preoxygenation: Pre-oxygenation with 100% oxygen Induction Type: IV induction Ventilation: Mask ventilation without difficulty Laryngoscope Size: Miller and 3 Grade View: Grade II Tube type: Oral Tube size: 7.0 mm Number of attempts: 1 Airway Equipment and Method: Stylet and Oral airway Placement Confirmation: ETT inserted through vocal cords under direct vision, positive ETCO2 and breath sounds checked- equal and bilateral Secured at: 20 cm Tube secured with: Tape Dental Injury: Teeth and Oropharynx as per pre-operative assessment

## 2023-06-25 NOTE — Op Note (Signed)
 Operative Note   DATE OF OPERATION: 3.28.2025  LOCATION: [facility]-outpatient  SURGICAL DIVISION: Plastic Surgery  PREOPERATIVE DIAGNOSES:  1. History breast cancer 2. Acquired absence breasts 3. CHEK2 mutation  POSTOPERATIVE DIAGNOSES:  same  PROCEDURE:  1. Removal bilateral chest tissue expanders and placement silicone implants 2. Complex repair chest 2 cm  SURGEON: Glenna Fellows MD MBA  ASSISTANT: none  ANESTHESIA:  General.   EBL: 50 ml  COMPLICATIONS: None immediate.   INDICATIONS FOR PROCEDURE:  The patient, Kristina King, is a 48 y.o. female born on [birth year], is here for staged breast reconstruction following nipple sparing mastectomies and prepectoral expander based reconstruction.   FINDINGS: Complete incorporation ADM noted bilateral. Natrelle Smooth Round Extra Projection Soft Touch 470 ml implants placed bilateral REF [id number] RIGHT SN [id number] LEFT SN [id number]  DESCRIPTION OF PROCEDURE:  The patient's operative site was marked with the patient in the preoperative area including areas of depressed scar from drain exit sites for which patient desires correction. The patient was taken to the operating room. SCDs were placed and IV antibiotics were given. The patient's operative site was prepped and draped in a sterile fashion. A time out was performed and all information was confirmed to be correct. Incision made in left inframammary fold and carried through superficial fascia and acellular dermis. Expander removed. Superior lateral capsulotomy and inferior capsulotomy completed. Sizer placed. Over right chest, incision made in inframammary fold scar and carried through superficial fascia and acellular dermis. Expander removed. Superior , medial and medial inferior capsulotomies performed. Sizer placed. Patient brought to upright sitting position. An Extra Projection 470 implant selected for bilateral placement. Patient returned to supine position.    Right  chest cavity irrigated with saline solution containing Ancef, gentamicin, and Betadine. Hemostasis ensured. The implant was placed in right chest, and implant orientation ensured. Closure completed with 3-0 vicryl to close superficial fascia and ADM over implant. 4-0 vicryl used to close dermis followed by 4-0 monocryl subcuticular. Sharp excision of depressed scar completed with knife. Layered closure completed with 4-0 vicryl interrupted in dermis and skin closure completed with 4-0 monocryl subcuticular length 1 cm. Implant placed in left chest cavity, and implant orientation ensured. Closure completed with 3-0 vicryl in superficial fascia and ADM, 4-0 vicryl in dermis, and 4-0 monocryl subcuticular skin closure. Layered closure completed with 4-0 vicryl interrupted in dermis and skin closure completed with 4-0 monocryl subcuticular length 1 cm. Dermabond applied. Dry dressing, followed by breast binder applied.   The patient was allowed to wake from anesthesia, extubated and taken to the recovery room in satisfactory condition.   SPECIMENS: none  DRAINS: none  Glenna Fellows, MD [facility] Plastic & Reconstructive Surgery  Office/ physician access line after hours [phone]

## 2023-06-28 ENCOUNTER — Encounter (HOSPITAL_BASED_OUTPATIENT_CLINIC_OR_DEPARTMENT_OTHER): Payer: Self-pay | Admitting: Plastic Surgery

## 2023-07-13 ENCOUNTER — Encounter: Payer: Self-pay | Admitting: Hematology and Oncology

## 2023-08-05 DIAGNOSIS — Z86711 Personal history of pulmonary embolism: Secondary | ICD-10-CM | POA: Insufficient documentation

## 2023-08-05 LAB — HM PAP SMEAR

## 2023-08-09 ENCOUNTER — Ambulatory Visit (INDEPENDENT_AMBULATORY_CARE_PROVIDER_SITE_OTHER): Payer: Managed Care, Other (non HMO) | Admitting: Family Medicine

## 2023-08-09 ENCOUNTER — Encounter: Payer: Self-pay | Admitting: Family Medicine

## 2023-08-09 VITALS — BP 104/62 | HR 98 | Temp 97.9°F | Ht 64.5 in | Wt 128.5 lb

## 2023-08-09 DIAGNOSIS — E7800 Pure hypercholesterolemia, unspecified: Secondary | ICD-10-CM | POA: Diagnosis not present

## 2023-08-09 DIAGNOSIS — Z0000 Encounter for general adult medical examination without abnormal findings: Secondary | ICD-10-CM

## 2023-08-09 LAB — CBC WITH DIFFERENTIAL/PLATELET
Basophils Absolute: 0 10*3/uL (ref 0.0–0.1)
Basophils Relative: 0.3 % (ref 0.0–3.0)
Eosinophils Absolute: 0.1 10*3/uL (ref 0.0–0.7)
Eosinophils Relative: 1 % (ref 0.0–5.0)
HCT: 40.6 % (ref 36.0–46.0)
Hemoglobin: 13.6 g/dL (ref 12.0–15.0)
Lymphocytes Relative: 24.8 % (ref 12.0–46.0)
Lymphs Abs: 1.6 10*3/uL (ref 0.7–4.0)
MCHC: 33.7 g/dL (ref 30.0–36.0)
MCV: 89.3 fl (ref 78.0–100.0)
Monocytes Absolute: 0.5 10*3/uL (ref 0.1–1.0)
Monocytes Relative: 7.7 % (ref 3.0–12.0)
Neutro Abs: 4.1 10*3/uL (ref 1.4–7.7)
Neutrophils Relative %: 66.2 % (ref 43.0–77.0)
Platelets: 179 10*3/uL (ref 150.0–400.0)
RBC: 4.54 Mil/uL (ref 3.87–5.11)
RDW: 13.5 % (ref 11.5–15.5)
WBC: 6.3 10*3/uL (ref 4.0–10.5)

## 2023-08-09 LAB — HEPATIC FUNCTION PANEL
ALT: 14 U/L (ref 0–35)
AST: 21 U/L (ref 0–37)
Albumin: 4.2 g/dL (ref 3.5–5.2)
Alkaline Phosphatase: 52 U/L (ref 39–117)
Bilirubin, Direct: 0.1 mg/dL (ref 0.0–0.3)
Total Bilirubin: 0.6 mg/dL (ref 0.2–1.2)
Total Protein: 7 g/dL (ref 6.0–8.3)

## 2023-08-09 LAB — BASIC METABOLIC PANEL WITH GFR
BUN: 15 mg/dL (ref 6–23)
CO2: 30 meq/L (ref 19–32)
Calcium: 9.5 mg/dL (ref 8.4–10.5)
Chloride: 102 meq/L (ref 96–112)
Creatinine, Ser: 0.75 mg/dL (ref 0.40–1.20)
GFR: 93.67 mL/min (ref >60.00)
Glucose, Bld: 81 mg/dL (ref 70–99)
Potassium: 4.1 meq/L (ref 3.5–5.1)
Sodium: 138 meq/L (ref 135–145)

## 2023-08-09 LAB — LIPID PANEL
Cholesterol: 217 mg/dL — ABNORMAL HIGH (ref 0–200)
HDL: 75 mg/dL (ref >39.00)
LDL Cholesterol: 128 mg/dL — ABNORMAL HIGH (ref 0–99)
NonHDL: 141.76
Total CHOL/HDL Ratio: 3
Triglycerides: 67 mg/dL (ref 0.0–149.0)
VLDL: 13.4 mg/dL (ref 0.0–40.0)

## 2023-08-09 LAB — TSH: TSH: 1.65 u[IU]/mL (ref 0.35–5.50)

## 2023-08-09 LAB — VITAMIN D 25 HYDROXY (VIT D DEFICIENCY, FRACTURES): VITD: 80.95 ng/mL (ref 30.00–100.00)

## 2023-08-09 NOTE — Patient Instructions (Signed)
Follow up in 1 year or as needed We'll notify you of your lab results and make any changes if needed Keep up the good work on healthy diet and regular exercise- you look great!!! Call with any questions or concerns Stay Safe!  Stay Healthy! Have a great summer!!! 

## 2023-08-09 NOTE — Progress Notes (Signed)
   Subjective:    Patient ID: Kristina King, female    DOB: 11/24/1975, 48 y.o.   MRN: [id number]  HPI CPE- UTD on pap, colonoscopy, Tdap  Patient Care Team    Relationship Specialty Notifications Start End  Jess Morita, MD PCP - General Family Medicine  07/18/19   Alane Hsu, RN Oncology Nurse Navigator   03/18/23   Auther Bo, RN Oncology Nurse Navigator   03/18/23   Cameron Cea, MD Consulting Physician Hematology and Oncology  03/18/23   Sim Dryer, MD Consulting Physician General Surgery  03/18/23   Thora Flint, MD Consulting Physician Obstetrics and Gynecology  08/09/23     Health Maintenance  Topic Date Due   INFLUENZA VACCINE  10/29/2023   Cervical Cancer Screening (HPV/Pap Cotest)  08/05/2026   DTaP/Tdap/Td (2 - Td or Tdap) 04/24/2030   Colonoscopy  09/21/2030   Hepatitis C Screening  Completed   HIV Screening  Completed   HPV VACCINES  Aged Out   Meningococcal B Vaccine  Aged Out   COVID-19 Vaccine  Discontinued      Review of Systems Patient reports no vision/hearing changes, adenopathy,fever, weight change,  persistant/recurrent hoarseness , swallowing issues, chest pain, palpitations, edema, persistant/recurrent cough, hemoptysis, dyspnea (rest/exertional/paroxysmal nocturnal), gastrointestinal bleeding (melena, rectal bleeding), abdominal pain, significant heartburn, bowel changes, GU symptoms (dysuria, hematuria, incontinence), Gyn symptoms (abnormal  bleeding, pain),  syncope, focal weakness, memory loss, numbness & tingling, skin/hair/nail changes, abnormal bruising or bleeding, anxiety, or depression.     Objective:   Physical Exam General Appearance:    Alert, cooperative, no distress, appears stated age  Head:    Normocephalic, without obvious abnormality, atraumatic  Eyes:    PERRL, conjunctiva/corneas clear, EOM's intact both eyes  Ears:    Normal TM's and external ear canals, both ears  Nose:   Nares normal, septum midline,  mucosa normal, no drainage    or sinus tenderness  Throat:   Lips, mucosa, and tongue normal; teeth and gums normal  Neck:   Supple, symmetrical, trachea midline, no adenopathy;    Thyroid : no enlargement/tenderness/nodules  Back:     Symmetric, no curvature, ROM normal, no CVA tenderness  Lungs:     Clear to auscultation bilaterally, respirations unlabored  Chest Wall:    No tenderness or deformity   Heart:    Regular rate and rhythm, S1 and S2 normal, no murmur, rub   or gallop  Breast Exam:    Deferred to GYN  Abdomen:     Soft, non-tender, bowel sounds active all four quadrants,    no masses, no organomegaly  Genitalia:    Deferred to GYN  Rectal:    Extremities:   Extremities normal, atraumatic, no cyanosis or edema  Pulses:   2+ and symmetric all extremities  Skin:   Skin color, texture, turgor normal, no rashes or lesions  Lymph nodes:   Cervical, supraclavicular, and axillary nodes normal  Neurologic:   CNII-XII intact, normal strength, sensation and reflexes    throughout          Assessment & Plan:

## 2023-08-09 NOTE — Assessment & Plan Note (Signed)
 Pt's PE WNL.  UTD on pap, colonoscopy, Tdap.  Given that she has hx of breast cancer and Chek2 gene, will reach out to GI and see if they want to repeat her colonoscopy sooner than originally intended.  Check labs.  Anticipatory guidance provided.

## 2023-08-10 ENCOUNTER — Ambulatory Visit: Payer: Self-pay | Admitting: Family Medicine

## 2023-08-10 NOTE — Telephone Encounter (Signed)
 Lab results have been discussed.   Verbalized understanding? Yes  Are there any questions? No

## 2023-08-10 NOTE — Telephone Encounter (Signed)
-----   Message from Laymon Priest sent at 08/10/2023  8:48 AM EDT ----- Labs look great!  Your total cholesterol and LDL (bad cholesterol) have increased somewhat but the ratio of good cholesterol to bad is still outstanding at 3.  No meds needed at this time.  Keep up the good work!

## 2023-08-16 NOTE — Progress Notes (Signed)
 Pt informed and recall placed for 08/2025

## 2023-09-21 ENCOUNTER — Encounter: Admitting: Adult Health

## 2023-10-27 ENCOUNTER — Inpatient Hospital Stay: Attending: Adult Health | Admitting: Adult Health

## 2023-10-27 VITALS — BP 102/70 | HR 64 | Temp 98.2°F | Resp 16 | Ht 64.5 in | Wt 131.4 lb

## 2023-10-27 DIAGNOSIS — Z170 Estrogen receptor positive status [ER+]: Secondary | ICD-10-CM | POA: Insufficient documentation

## 2023-10-27 DIAGNOSIS — D0511 Intraductal carcinoma in situ of right breast: Secondary | ICD-10-CM

## 2023-10-27 DIAGNOSIS — Z9013 Acquired absence of bilateral breasts and nipples: Secondary | ICD-10-CM | POA: Insufficient documentation

## 2023-10-27 DIAGNOSIS — Z86000 Personal history of in-situ neoplasm of breast: Secondary | ICD-10-CM | POA: Diagnosis present

## 2023-10-27 NOTE — Progress Notes (Unsigned)
 SURVIVORSHIP VISIT:  BRIEF ONCOLOGIC HISTORY:  Oncology History  Ductal carcinoma in situ (DCIS) of right breast  03/15/2023 Initial Diagnosis   Ductal carcinoma in situ (DCIS) of right breast   04/02/2023 Surgery   Left breast Mastectomy: Negative for carcinoma Right breast Mastectomy: High grade DCIS; ER/PR+; 0/1 lymph nodes positive for carcinoma     INTERVAL HISTORY:  Ms. Kavanagh to review her survivorship care plan detailing her treatment course for breast cancer, as well as monitoring long-term side effects of that treatment, education regarding health maintenance, screening, and overall wellness and health promotion.     Overall, Ms. Emory reports feeling quite well.  She is s/p bilateral mastectomy and is healing well.    REVIEW OF SYSTEMS:  Review of Systems  Constitutional:  Negative for appetite change, chills, fatigue, fever and unexpected weight change.  HENT:   Negative for hearing loss, lump/mass and trouble swallowing.   Eyes:  Negative for eye problems and icterus.  Respiratory:  Negative for chest tightness, cough and shortness of breath.   Cardiovascular:  Negative for chest pain, leg swelling and palpitations.  Gastrointestinal:  Negative for abdominal distention, abdominal pain, constipation, diarrhea, nausea and vomiting.  Endocrine: Negative for hot flashes.  Genitourinary:  Negative for difficulty urinating.   Musculoskeletal:  Negative for arthralgias.  Skin:  Negative for itching and rash.  Neurological:  Negative for dizziness, extremity weakness, headaches and numbness.  Hematological:  Negative for adenopathy. Does not bruise/bleed easily.  Psychiatric/Behavioral:  Negative for depression. The patient is not nervous/anxious.    Breast: Denies any new nodularity, masses, tenderness, nipple changes, or nipple discharge.       PAST MEDICAL/SURGICAL HISTORY:  Past Medical History:  Diagnosis Date   Allergy    Anxiety    Benign thyroid  cyst     aspirated   Breast cancer (HCC)    DCIS (ductal carcinoma in situ)    Depression    Pt denies depression   GERD (gastroesophageal reflux disease)    Headache    History of chicken pox    Past Surgical History:  Procedure Laterality Date   AUGMENTATION MAMMAPLASTY Bilateral    BREAST BIOPSY Right 03/11/2023   US  RT BREAST BX W LOC DEV 1ST LESION IMG BX SPEC US  GUIDE 03/11/2023 [facility]AP[facility]   BREAST RECONSTRUCTION WITH PLACEMENT OF TISSUE EXPANDER AND ALLODERM Bilateral 04/02/2023   Procedure: BREAST RECONSTRUCTION WITH PLACEMENT OF TISSUE EXPANDER AND ALLODERM POSSIBLE SILICONE IMPLANT;  Surgeon: Arelia Filippo, MD;  Location: [facility];  Service: Plastics;  Laterality: Bilateral;   BREAST SURGERY     augmentation   ELBOW SURGERY     HARDWARE REMOVAL Right 02/01/2015   Procedure: RIGHT ELBOW HARDWARE REMOVAL;  Surgeon: Fonda Olmsted, MD;  Location: [facility];  Service: Orthopedics;  Laterality: Right;   NIPPLE SPARING MASTECTOMY Right 04/02/2023   Procedure: RIGHT NIPPLE SPARING MASTECTOMY;  Surgeon: Vanderbilt Ned, MD;  Location: [facility];  Service: General;  Laterality: Right;   REMOVAL OF BILATERAL TISSUE EXPANDERS WITH PLACEMENT OF BILATERAL BREAST IMPLANTS Bilateral 06/25/2023   Procedure: REMOVAL, TISSUE EXPANDER, BREAST, BILATERAL, WITH BILATERAL IMPLANT IMPLANT INSERTION; BILATERAL SCAR REVISION;  Surgeon: Arelia Filippo, MD;  Location: [facility];  Service: Plastics;  Laterality: Bilateral;   SIMPLE MASTECTOMY WITH AXILLARY SENTINEL NODE BIOPSY Left 04/02/2023   Procedure: LEFT RISK REDUCING MASTECTOMY;  Surgeon: Vanderbilt Ned, MD;  Location: [facility];  Service: General;  Laterality: Left;  TONSILLECTOMY     TONSILLECTOMY     WRIST ARTHROSCOPY       ALLERGIES:  Allergies  Allergen Reactions   Chlorhexidine Itching and Rash     CURRENT MEDICATIONS:  Outpatient Encounter  Medications as of 10/27/2023  Medication Sig   buPROPion  (WELLBUTRIN  XL) 150 MG 24 hr tablet Take 150 mg by mouth daily.   ESSENTIAL MAGNESIUM PO Take by mouth daily.   Gamma-Aminobutyric Acid (GABA PO) Take by mouth daily.   VITAMIN D  PO Take by mouth daily.   OVER THE COUNTER MEDICATION Take 1 Scoop by mouth daily. Collagen 11.5 g with tryptophan 140 mg (Patient not taking: Reported on 10/27/2023)   No facility-administered encounter medications on file as of 10/27/2023.     ONCOLOGIC FAMILY HISTORY:  Family History  Problem Relation Age of Onset   Hypertension Mother    Cancer Mother        breast   Hyperlipidemia Mother    Breast cancer Mother 12       reoccurred at 54 with Paget's   Hypertension Father    Heart disease Father    Colon polyps Sister    Healthy Daughter    Cancer Maternal Grandmother        breast   Hypertension Maternal Grandmother    Stroke Maternal Grandmother    Cancer Maternal Grandfather        colon, liver   Early death Maternal Grandfather    Cancer Paternal Grandmother        lung   Healthy Son    Heart attack Neg Hx    Diabetes Neg Hx    Colon cancer Neg Hx    Rectal cancer Neg Hx    Stomach cancer Neg Hx    Esophageal cancer Neg Hx      SOCIAL HISTORY:  Social History   Socioeconomic History   Marital status: Married    Spouse name: Not on file   Number of children: Not on file   Years of education: Not on file   Highest education level: Master's degree (e.g., MA, MS, MEng, MEd, MSW, MBA)  Occupational History   Not on file  Tobacco Use   Smoking status: Former    Current packs/day: 0.00    Types: Cigarettes    Quit date: 04/14/1993    Years since quitting: 30.5   Smokeless tobacco: Never  Vaping Use   Vaping status: Never Used  Substance and Sexual Activity   Alcohol use: Yes    Alcohol/week: 1.0 - 2.0 standard drink of alcohol    Types: 1 - 2 Standard drinks or equivalent per week    Comment: month   Drug use: No    Sexual activity: Yes  Other Topics Concern   Not on file  Social History Narrative   Not on file   Social Drivers of Health   Financial Resource Strain: Low Risk  (08/08/2023)   Overall Financial Resource Strain (CARDIA)    Difficulty of Paying Living Expenses: Not hard at all  Food Insecurity: No Food Insecurity (08/08/2023)   Hunger Vital Sign    Worried About Running Out of Food in the Last Year: Never true    Ran Out of Food in the Last Year: Never true  Transportation Needs: No Transportation Needs (08/08/2023)   PRAPARE - Administrator, Civil Service (Medical): No    Lack of Transportation (Non-Medical): No  Physical Activity: Sufficiently Active (08/08/2023)   Exercise Vital Sign  Days of Exercise per Week: 7 days    Minutes of Exercise per Session: 40 min  Stress: No Stress Concern Present (08/08/2023)   [employer] of Occupational Health - Occupational Stress Questionnaire    Feeling of Stress : Not at all  Social Connections: Moderately Integrated (08/08/2023)   Social Connection and Isolation Panel    Frequency of Communication with Friends and Family: More than three times a week    Frequency of Social Gatherings with Friends and Family: Once a week    Attends Religious Services: 1 to 4 times per year    Active Member of [employer] or Organizations: No    Attends Engineer, structural: Not on file    Marital Status: Married  Intimate Partner Violence: Unknown (07/22/2022)   Received from [facility]   HITS    Physically Hurt: Not on file    Insult or Talk Down To: Not on file    Threaten Physical Harm: Not on file    Scream or Curse: Not on file     OBSERVATIONS/OBJECTIVE:  BP 102/70 (BP Location: Left Arm, Patient Position: Sitting)   Pulse 64   Temp 98.2 F (36.8 C) (Temporal)   Resp 16   Ht 5' 4.5 (1.638 m)   Wt 131 lb 6.4 oz (59.6 kg)   SpO2 100%   BMI 22.21 kg/m  GENERAL: Patient is a well appearing female in no acute  distress HEENT:  Sclerae anicteric.  Oropharynx clear and moist. No ulcerations or evidence of oropharyngeal candidiasis. Neck is supple.  NODES:  No cervical, supraclavicular, or axillary lymphadenopathy palpated.  BREAST EXAM:  s/p bilateral mastectomy and reconstruction, no sign of local recurrence LUNGS:  Clear to auscultation bilaterally.  No wheezes or rhonchi. HEART:  Regular rate and rhythm. No murmur appreciated. ABDOMEN:  Soft, nontender.  Positive, normoactive bowel sounds. No organomegaly palpated. MSK:  No focal spinal tenderness to palpation. Full range of motion bilaterally in the upper extremities. EXTREMITIES:  No peripheral edema.   SKIN:  Clear with no obvious rashes or skin changes. No nail dyscrasia. NEURO:  Nonfocal. Well oriented.  Appropriate affect.   LABORATORY DATA:  None for this visit.  DIAGNOSTIC IMAGING:  None for this visit.      ASSESSMENT AND PLAN:  Ms.. Cravey is a pleasant 48 y.o. female with Stage 0 right breast DCIS, diagnosed in 02/2023 s/p bilateral mastectomy.  She presents to the Survivorship Clinic for our initial meeting and routine follow-up post-completion of treatment for breast cancer.    1. Stage 0 right breast cancer:  Ms. Milford is continuing to recover from definitive treatment for breast cancer. She will follow-up with her medical oncologist, Dr.  Odean in 1 year with history and physical exam per surveillance protocol.  Today, a comprehensive survivorship care plan and treatment summary was reviewed with the patient today detailing her breast cancer diagnosis, treatment course, potential late/long-term effects of treatment, appropriate follow-up care with recommendations for the future, and patient education resources.  A copy of this summary, along with a letter will be sent to the patient's primary care provider via mail/fax/In Basket message after today's visit.    2. Bone health:  She was given education on specific activities to  promote bone health.  3. Cancer screening:  Due to Ms. Tomaselli's history and her age, she should receive screening for skin cancers, colon cancer, and gynecologic cancers.  The information and recommendations are listed on the patient's comprehensive care plan/treatment  summary and were reviewed in detail with the patient.    4. Health maintenance and wellness promotion: Ms. Meditz was encouraged to consume 5-7 servings of fruits and vegetables per day. We reviewed the Nutrition Rainbow handout.  She was also encouraged to engage in moderate to vigorous exercise for 30 minutes per day most days of the week.  She was instructed to limit her alcohol consumption and continue to abstain from tobacco use.     5. Support services/counseling: It is not uncommon for this period of the patient's cancer care trajectory to be one of many emotions and stressors.   She was given information regarding our available services and encouraged to contact me with any questions or for help enrolling in any of our support group/programs.    Follow up instructions:    -Return to cancer center in 1 year for f/u  -She is welcome to return back to the Survivorship Clinic at any time; no additional follow-up needed at this time.  -Consider referral back to survivorship as a long-term survivor for continued surveillance  The patient was provided an opportunity to ask questions and all were answered. The patient agreed with the plan and demonstrated an understanding of the instructions.   Total encounter time:40 minutes*in face-to-face visit time, chart review, lab review, care coordination, order entry, and documentation of the encounter time.    Morna Kendall, NP 10/27/23 9:30 AM Medical Oncology and Hematology [facility] [address] [place], [place] [address] Tel. [phone]    Fax. [phone]  *Total Encounter Time as defined by the Centers for Medicare and Medicaid Services includes, in  addition to the face-to-face time of a patient visit (documented in the note above) non-face-to-face time: obtaining and reviewing outside history, ordering and reviewing medications, tests or procedures, care coordination (communications with other health care professionals or caregivers) and documentation in the medical record.

## 2023-10-29 ENCOUNTER — Encounter: Payer: Self-pay | Admitting: Adult Health

## 2023-11-29 ENCOUNTER — Other Ambulatory Visit: Payer: Self-pay | Admitting: Hematology and Oncology

## 2023-11-29 ENCOUNTER — Other Ambulatory Visit: Payer: Self-pay | Admitting: Family Medicine

## 2023-12-15 ENCOUNTER — Ambulatory Visit (INDEPENDENT_AMBULATORY_CARE_PROVIDER_SITE_OTHER): Admitting: Family Medicine

## 2023-12-15 ENCOUNTER — Encounter: Payer: Self-pay | Admitting: Family Medicine

## 2023-12-15 VITALS — BP 118/60 | HR 80 | Temp 98.3°F | Wt 132.0 lb

## 2023-12-15 DIAGNOSIS — R051 Acute cough: Secondary | ICD-10-CM | POA: Diagnosis not present

## 2023-12-15 DIAGNOSIS — H6993 Unspecified Eustachian tube disorder, bilateral: Secondary | ICD-10-CM

## 2023-12-15 DIAGNOSIS — E049 Nontoxic goiter, unspecified: Secondary | ICD-10-CM | POA: Insufficient documentation

## 2023-12-15 LAB — POC COVID19 BINAXNOW: SARS Coronavirus 2 Ag: NEGATIVE

## 2023-12-15 MED ORDER — PREDNISONE 10 MG PO TABS: ORAL_TABLET | ORAL | 0 refills | Status: AC

## 2023-12-15 NOTE — Progress Notes (Signed)
   Subjective:    Patient ID: Kristina King Billing, female    DOB: 1975-10-25, 48 y.o.   MRN: [id number]  HPI URI- sxs started last week w/ head and nasal congestion.  No fever.  Bilateral ear pressure but no pain.  + itching.  Denies sinus pain/pressure.  Minimal cough.  No known sick contacts.  Taking Claritin daily but tried to stop medication 2 weeks ago and that's when sxs flared.     Review of Systems For ROS see HPI     Objective:   Physical Exam Vitals reviewed.  Constitutional:      General: She is not in acute distress.    Appearance: Normal appearance. She is not ill-appearing.  HENT:     Head: Normocephalic and atraumatic.     Right Ear: Tympanic membrane is retracted.     Left Ear: Tympanic membrane is retracted.     Nose: Congestion present.     Right Turbinates: Swollen.     Left Turbinates: Swollen.     Right Sinus: No maxillary sinus tenderness or frontal sinus tenderness.     Left Sinus: No maxillary sinus tenderness or frontal sinus tenderness.  Eyes:     Extraocular Movements: Extraocular movements intact.     Conjunctiva/sclera: Conjunctivae normal.  Cardiovascular:     Rate and Rhythm: Normal rate and regular rhythm.  Pulmonary:     Effort: Pulmonary effort is normal. No respiratory distress.     Breath sounds: No wheezing or rhonchi.  Musculoskeletal:     Cervical back: Neck supple.  Lymphadenopathy:     Cervical: No cervical adenopathy.  Neurological:     General: No focal deficit present.     Mental Status: She is alert and oriented to person, place, and time.  Psychiatric:        Mood and Affect: Mood normal.        Behavior: Behavior normal.        Thought Content: Thought content normal.           Assessment & Plan:  Eustachian tube dysfxn- new.  L>R.  COVID negative, no evidence of bacterial infxn.  Continue daily allergy medication.  Start low dose Prednisone  taper to improve sxs.  Pt expressed understanding and is in agreement w/ plan.

## 2023-12-15 NOTE — Patient Instructions (Signed)
 Follow up as needed or as scheduled START the Prednisone  as directed- 3 pills at the same time x3 days, then 2 pills at the same time x3 days, then 1 pill daily.  Take w/ food  CONTINUE the Claritin ADD Flonase or Nasonex (available OTC) to help open the eustachian tube Drink LOTS of fluids Call with any questions or concerns Hang in there! GOOD LUCK WITH SURGERY!!!

## 2023-12-21 ENCOUNTER — Telehealth: Payer: Self-pay

## 2023-12-21 DIAGNOSIS — H6993 Unspecified Eustachian tube disorder, bilateral: Secondary | ICD-10-CM

## 2023-12-21 MED ORDER — AMOXICILLIN 875 MG PO TABS: 875.0000 mg | ORAL_TABLET | Freq: Two times a day (BID) | ORAL | 0 refills | Status: AC

## 2023-12-21 NOTE — Telephone Encounter (Signed)
 Called patient and she is not feeling any better. She is still having sinus pressure and congestion that is waking her up. No new sx. Patient has 2 days left of the prednisone . She did add Flonase which has not helped. Patient was seen for sx on 12/15/23. Patient is wondering if she needs an ENT referral? She wants to get better before her surgery on Friday

## 2023-12-21 NOTE — Telephone Encounter (Signed)
 Copied from CRM [id number]. Topic: Clinical - Medical Advice >> Dec 21, 2023  8:31 AM Thersia BROCKS wrote: Reason for CRM: Patient called in regarding 09/17 appointment, patient stated she feels like the medication hasn't really done anything, wanted to know what else she may needs to do, stated she has feels worst today then ever   [phone]

## 2023-12-21 NOTE — Telephone Encounter (Signed)
 Prescription sent for high dose Amoxicillin  to tx possible sinus infection.  Unfortunately it would be highly unlikely to get an ENT appt before her scheduled surgery.  Hopefully the antibiotics will make her feel much better

## 2023-12-21 NOTE — Telephone Encounter (Signed)
 Called patient to relay Dr. Charis message. Left vm to return call

## 2023-12-21 NOTE — Telephone Encounter (Signed)
 Patient called back and was informed.

## 2023-12-22 ENCOUNTER — Encounter (HOSPITAL_BASED_OUTPATIENT_CLINIC_OR_DEPARTMENT_OTHER): Payer: Self-pay | Admitting: Plastic Surgery

## 2023-12-23 NOTE — Telephone Encounter (Signed)
 Pt is request referral for ENT.

## 2023-12-23 NOTE — Telephone Encounter (Signed)
 Copied from CRM #[id number]. Topic: Referral - Request for Referral >> Dec 23, 2023  9:24 AM Alfonso ORN wrote:  Did the patient discuss referral with their provider in the last year? Yes (If No - schedule appointment) (If Yes - send message)  Appointment offered? No  Type of order/referral and detailed reason for visit: ENT referral request as previously discussed as medications that have been tried have not been helping symptoms   Preference of office, provider, location: no the person who can see patient soonest  If referral order, have you been seen by this specialty before? Yes (If Yes, this issue or another issue? 25 plus years ago.  Where?  Can we respond through MyChart? Yes

## 2023-12-23 NOTE — Addendum Note (Signed)
 Addended by: Noelie Renfrow E on: 12/23/2023 01:22 PM   Modules accepted: Orders

## 2023-12-23 NOTE — Telephone Encounter (Signed)
 Pt has been taking 6 days of abx  She is going to reach out to a few medical friends to see if she can get into ENT sooner. She will call back if she would like to change the placement of the referral.

## 2023-12-23 NOTE — Telephone Encounter (Signed)
 Referral placed to ENT at pt's request.  This is unlikely to occur before next Friday's surgery but we can try.  Did she start the abx that were sent in?

## 2023-12-24 ENCOUNTER — Telehealth: Payer: Self-pay

## 2023-12-24 NOTE — Telephone Encounter (Signed)
 FYI

## 2023-12-24 NOTE — Telephone Encounter (Signed)
 Copied from CRM #[id number]. Topic: Referral - Question >> Dec 24, 2023 11:04 AM Thersia BROCKS wrote: Reason for CRM: Patient called in wanted to let Dr.Tabori know that she was able to schedule with the ENT   [name], MD Specialties and/or Subspecialties ENT/Otolaryngology, Otorhinolaryngology [facility] [address] [place], [place] [address]  Tuesday at 1:50

## 2023-12-28 ENCOUNTER — Encounter (INDEPENDENT_AMBULATORY_CARE_PROVIDER_SITE_OTHER): Payer: Self-pay | Admitting: Otolaryngology

## 2023-12-28 ENCOUNTER — Ambulatory Visit (INDEPENDENT_AMBULATORY_CARE_PROVIDER_SITE_OTHER): Admitting: Otolaryngology

## 2023-12-28 VITALS — BP 120/84 | HR 66 | Temp 97.6°F | Ht 64.0 in | Wt 129.0 lb

## 2023-12-28 DIAGNOSIS — H6983 Other specified disorders of Eustachian tube, bilateral: Secondary | ICD-10-CM

## 2023-12-28 DIAGNOSIS — J343 Hypertrophy of nasal turbinates: Secondary | ICD-10-CM | POA: Diagnosis not present

## 2023-12-28 DIAGNOSIS — R0981 Nasal congestion: Secondary | ICD-10-CM

## 2023-12-28 DIAGNOSIS — J310 Chronic rhinitis: Secondary | ICD-10-CM | POA: Diagnosis not present

## 2023-12-28 DIAGNOSIS — Z87891 Personal history of nicotine dependence: Secondary | ICD-10-CM | POA: Diagnosis not present

## 2023-12-28 NOTE — Progress Notes (Unsigned)
 CC: Clogging sensation in the ears  HPI:  Kristina King is a 48 y.o. female who presents today complaining of clogging and pressure sensations in her ears for the past 4 weeks.  The discomfort is worse on the left side.  The patient has a history of environmental allergies.  She was previously treated with Flonase and Claritin.  She briefly stopped the use of Flonase due to recurrent epistaxis.  Due to her ear discomfort, she was restarted on Flonase last week.  She was also treated with prednisone  and antibiotic.  Currently she denies any otorrhea, vertigo, or significant hearing loss.  Past Medical History:  Diagnosis Date   Allergy    Anxiety    Benign thyroid  cyst    aspirated   Breast cancer (HCC)    DCIS (ductal carcinoma in situ)    Depression    Pt denies depression   GERD (gastroesophageal reflux disease)    Headache    History of chicken pox    Pulmonary embolism (HCC)     Past Surgical History:  Procedure Laterality Date   AUGMENTATION MAMMAPLASTY Bilateral    BREAST BIOPSY Right 03/11/2023   US  RT BREAST BX W LOC DEV 1ST LESION IMG BX SPEC US  GUIDE 03/11/2023 [facility]AMMOGRAPHY   BREAST RECONSTRUCTION WITH PLACEMENT OF TISSUE EXPANDER AND ALLODERM Bilateral 04/02/2023   Procedure: BREAST RECONSTRUCTION WITH PLACEMENT OF TISSUE EXPANDER AND ALLODERM POSSIBLE SILICONE IMPLANT;  Surgeon: Arelia Filippo, MD;  Location: [facility];  Service: Plastics;  Laterality: Bilateral;   BREAST SURGERY     augmentation   ELBOW SURGERY     HARDWARE REMOVAL Right 02/01/2015   Procedure: RIGHT ELBOW HARDWARE REMOVAL;  Surgeon: Fonda Olmsted, MD;  Location: [facility];  Service: Orthopedics;  Laterality: Right;   NIPPLE SPARING MASTECTOMY Right 04/02/2023   Procedure: RIGHT NIPPLE SPARING MASTECTOMY;  Surgeon: Vanderbilt Ned, MD;  Location: [facility];  Service: General;  Laterality: Right;   REMOVAL OF BILATERAL TISSUE EXPANDERS WITH  PLACEMENT OF BILATERAL BREAST IMPLANTS Bilateral 06/25/2023   Procedure: REMOVAL, TISSUE EXPANDER, BREAST, BILATERAL, WITH BILATERAL IMPLANT IMPLANT INSERTION; BILATERAL SCAR REVISION;  Surgeon: Arelia Filippo, MD;  Location: [facility];  Service: Plastics;  Laterality: Bilateral;   SIMPLE MASTECTOMY WITH AXILLARY SENTINEL NODE BIOPSY Left 04/02/2023   Procedure: LEFT RISK REDUCING MASTECTOMY;  Surgeon: Vanderbilt Ned, MD;  Location: [facility];  Service: General;  Laterality: Left;   TONSILLECTOMY     TONSILLECTOMY     WRIST ARTHROSCOPY      Family History  Problem Relation Age of Onset   Hypertension Mother    Cancer Mother        breast   Hyperlipidemia Mother    Breast cancer Mother 78       reoccurred at 33 with Paget's   Hypertension Father    Heart disease Father    Colon polyps Sister    Healthy Daughter    Cancer Maternal Grandmother        breast   Hypertension Maternal Grandmother    Stroke Maternal Grandmother    Cancer Maternal Grandfather        colon, liver   Early death Maternal Grandfather    Cancer Paternal Grandmother        lung   Healthy Son    Heart attack Neg Hx    Diabetes Neg Hx    Colon cancer Neg Hx    Rectal cancer Neg  Hx    Stomach cancer Neg Hx    Esophageal cancer Neg Hx     Social History:  reports that she quit smoking about 30 years ago. Her smoking use included cigarettes. She has never used smokeless tobacco. She reports that she does not currently use alcohol after a past usage of about 1.0 - 2.0 standard drink of alcohol per week. She reports that she does not use drugs.  Allergies:  Allergies  Allergen Reactions   Chlorhexidine Itching and Rash    Prior to Admission medications   Medication Sig Start Date End Date Taking? Authorizing Provider  amoxicillin  (AMOXIL ) 875 MG tablet Take 1 tablet (875 mg total) by mouth 2 (two) times daily for 10 days. 12/21/23 12/31/23 Yes Tabori, Katherine E, MD   buPROPion  (WELLBUTRIN  XL) 150 MG 24 hr tablet TAKE 1 TABLET(150 MG) BY MOUTH DAILY 11/30/23  Yes Gudena, Vinay, MD  ESSENTIAL MAGNESIUM PO Take by mouth daily.   Yes [provider]  Gamma-Aminobutyric Acid (GABA PO) Take by mouth daily.   Yes [provider]  OVER THE COUNTER MEDICATION Take 1 Scoop by mouth daily. Collagen 11.5 g with tryptophan 140 mg   Yes [provider]  VITAMIN D  PO Take by mouth daily.   Yes [provider]  predniSONE  (DELTASONE ) 10 MG tablet 3 tabs x3 days and then 2 tabs x3 days and then 1 tab x3 days.  Take w/ food. Patient not taking: Reported on 12/28/2023 12/15/23   Mahlon Comer BRAVO, MD    Blood pressure 120/84, pulse 66, temperature 97.6 F (36.4 C), temperature source Oral, height 5' 4 (1.626 m), weight 129 lb (58.5 kg), last menstrual period 12/01/2023, SpO2 98%. Exam: General: Communicates without difficulty, well nourished, no acute distress. Head: Normocephalic, no evidence injury, no tenderness, facial buttresses intact without stepoff. Face/sinus: No tenderness to palpation and percussion. Facial movement is normal and symmetric. Eyes: PERRL, EOMI. No scleral icterus, conjunctivae clear. Neuro: CN II exam reveals vision grossly intact.  No nystagmus at any point of gaze. Ears: Auricles well formed without lesions.  Ear canals are intact without mass or lesion.  No erythema or edema is appreciated.  The TMs are intact without fluid. Nose: External evaluation reveals normal support and skin without lesions.  Dorsum is intact.  Anterior rhinoscopy reveals congested mucosa over anterior aspect of inferior turbinates and intact septum.  No purulence noted. Oral:  Oral cavity and oropharynx are intact, symmetric, without erythema or edema.  Mucosa is moist without lesions. Neck: Full range of motion without pain.  There is no significant lymphadenopathy.  No masses palpable.  Thyroid  bed within normal limits to palpation.  Parotid  glands and submandibular glands equal bilaterally without mass.  Trachea is midline. Neuro:  CN 2-12 grossly intact.   Assessment: 1.  The patient's history and physical exam findings are consistent with bilateral eustachian tube dysfunction. 2.  Chronic rhinitis with nasal mucosal congestion and bilateral inferior turbinate hypertrophy.  Plan: 1.  The physical exam findings are reviewed with the patient. 2.  Flonase nasal spray 2 sprays each nostril daily.  The importance of consistent daily use is discussed.  The proper technique of using the nasal spray is also demonstrated. 3.  Valsalva exercise multiple times a day. 4.  The patient will return for reevaluation in 4 weeks.  Braleigh Massoud W Virlan Kempker 12/28/2023, 2:43 PM

## 2023-12-29 DIAGNOSIS — H6983 Other specified disorders of Eustachian tube, bilateral: Secondary | ICD-10-CM | POA: Insufficient documentation

## 2023-12-29 DIAGNOSIS — J343 Hypertrophy of nasal turbinates: Secondary | ICD-10-CM | POA: Insufficient documentation

## 2023-12-29 DIAGNOSIS — J310 Chronic rhinitis: Secondary | ICD-10-CM | POA: Insufficient documentation

## 2023-12-31 ENCOUNTER — Ambulatory Visit (HOSPITAL_BASED_OUTPATIENT_CLINIC_OR_DEPARTMENT_OTHER): Admitting: Anesthesiology

## 2023-12-31 ENCOUNTER — Encounter (HOSPITAL_BASED_OUTPATIENT_CLINIC_OR_DEPARTMENT_OTHER): Payer: Self-pay | Admitting: Plastic Surgery

## 2023-12-31 ENCOUNTER — Ambulatory Visit (HOSPITAL_BASED_OUTPATIENT_CLINIC_OR_DEPARTMENT_OTHER)
Admission: RE | Admit: 2023-12-31 | Discharge: 2023-12-31 | Disposition: A | Discharge status: Home / Self Care | Attending: Plastic Surgery | Admitting: Plastic Surgery

## 2023-12-31 ENCOUNTER — Other Ambulatory Visit: Payer: Self-pay

## 2023-12-31 ENCOUNTER — Encounter (HOSPITAL_BASED_OUTPATIENT_CLINIC_OR_DEPARTMENT_OTHER): Admission: RE | Disposition: A | Payer: Self-pay | Source: Home / Self Care | Attending: Plastic Surgery

## 2023-12-31 DIAGNOSIS — Z9013 Acquired absence of bilateral breasts and nipples: Secondary | ICD-10-CM | POA: Insufficient documentation

## 2023-12-31 DIAGNOSIS — F419 Anxiety disorder, unspecified: Secondary | ICD-10-CM

## 2023-12-31 DIAGNOSIS — Z86000 Personal history of in-situ neoplasm of breast: Secondary | ICD-10-CM

## 2023-12-31 DIAGNOSIS — Z86711 Personal history of pulmonary embolism: Secondary | ICD-10-CM | POA: Diagnosis not present

## 2023-12-31 DIAGNOSIS — L821 Other seborrheic keratosis: Secondary | ICD-10-CM | POA: Insufficient documentation

## 2023-12-31 DIAGNOSIS — Z1501 Genetic susceptibility to malignant neoplasm of breast: Secondary | ICD-10-CM | POA: Diagnosis present

## 2023-12-31 DIAGNOSIS — N650 Deformity of reconstructed breast: Secondary | ICD-10-CM | POA: Diagnosis present

## 2023-12-31 DIAGNOSIS — F418 Other specified anxiety disorders: Secondary | ICD-10-CM

## 2023-12-31 DIAGNOSIS — Z87891 Personal history of nicotine dependence: Secondary | ICD-10-CM

## 2023-12-31 DIAGNOSIS — Z01818 Encounter for other preprocedural examination: Secondary | ICD-10-CM

## 2023-12-31 HISTORY — PX: LIPOSUCTION WITH LIPOFILLING: SHX6436

## 2023-12-31 HISTORY — DX: Other pulmonary embolism without acute cor pulmonale: I26.99

## 2023-12-31 LAB — POCT PREGNANCY, URINE: Preg Test, Ur: NEGATIVE

## 2023-12-31 SURGERY — LIPOSUCTION, WITH FAT TRANSFER
Anesthesia: General | Site: Chest | Laterality: Bilateral

## 2023-12-31 MED ORDER — ACETAMINOPHEN 500 MG PO TABS
ORAL_TABLET | ORAL | Status: AC
  Filled 2023-12-31: qty 2

## 2023-12-31 MED ORDER — OXYCODONE HCL 5 MG/5ML PO SOLN: 5.0000 mg | Freq: Once | ORAL | Status: DC | PRN

## 2023-12-31 MED ORDER — EPHEDRINE 5 MG/ML INJ
INTRAVENOUS | Status: AC
  Filled 2023-12-31: qty 5

## 2023-12-31 MED ORDER — SODIUM CHLORIDE 0.9 % IV SOLN: 12.5000 mg | INTRAVENOUS | Status: DC | PRN

## 2023-12-31 MED ORDER — LACTATED RINGERS IV SOLN: INTRAVENOUS | Status: DC

## 2023-12-31 MED ORDER — MIDAZOLAM HCL 2 MG/2ML IJ SOLN
INTRAMUSCULAR | Status: AC
  Filled 2023-12-31: qty 2

## 2023-12-31 MED ORDER — LIDOCAINE 2% (20 MG/ML) 5 ML SYRINGE
INTRAMUSCULAR | Status: DC | PRN
  Administered 2023-12-31: 60 mg via INTRAVENOUS

## 2023-12-31 MED ORDER — FENTANYL CITRATE (PF) 100 MCG/2ML IJ SOLN
INTRAMUSCULAR | Status: AC
  Filled 2023-12-31: qty 2

## 2023-12-31 MED ORDER — EPHEDRINE SULFATE (PRESSORS) 50 MG/ML IJ SOLN
INTRAMUSCULAR | Status: DC | PRN
Start: 2023-12-31 — End: 2023-12-31
  Administered 2023-12-31: 10 mg via INTRAVENOUS

## 2023-12-31 MED ORDER — DEXAMETHASONE SODIUM PHOSPHATE 10 MG/ML IJ SOLN
INTRAMUSCULAR | Status: AC
  Filled 2023-12-31: qty 1

## 2023-12-31 MED ORDER — DEXMEDETOMIDINE HCL IN NACL 80 MCG/20ML IV SOLN
INTRAVENOUS | Status: DC | PRN
  Administered 2023-12-31: 8 ug via INTRAVENOUS

## 2023-12-31 MED ORDER — CELECOXIB 200 MG PO CAPS
200.0000 mg | ORAL_CAPSULE | ORAL | Status: DC
Start: 2023-12-31 — End: 2023-12-31

## 2023-12-31 MED ORDER — GABAPENTIN 300 MG PO CAPS
300.0000 mg | ORAL_CAPSULE | ORAL | Status: AC
  Administered 2023-12-31: 300 mg via ORAL

## 2023-12-31 MED ORDER — CELECOXIB 200 MG PO CAPS
ORAL_CAPSULE | ORAL | Status: AC
  Filled 2023-12-31: qty 1

## 2023-12-31 MED ORDER — GABAPENTIN 300 MG PO CAPS
ORAL_CAPSULE | ORAL | Status: AC
  Filled 2023-12-31: qty 1

## 2023-12-31 MED ORDER — CEFAZOLIN SODIUM-DEXTROSE 2-4 GM/100ML-% IV SOLN
INTRAVENOUS | Status: AC
  Filled 2023-12-31: qty 100

## 2023-12-31 MED ORDER — PHENYLEPHRINE 80 MCG/ML (10ML) SYRINGE FOR IV PUSH (FOR BLOOD PRESSURE SUPPORT)
PREFILLED_SYRINGE | INTRAVENOUS | Status: AC
  Filled 2023-12-31: qty 10

## 2023-12-31 MED ORDER — PROPOFOL 500 MG/50ML IV EMUL
INTRAVENOUS | Status: DC | PRN
  Administered 2023-12-31: 150 mg via INTRAVENOUS
  Administered 2023-12-31: 125 ug/kg/min via INTRAVENOUS

## 2023-12-31 MED ORDER — CELECOXIB 200 MG PO CAPS
200.0000 mg | ORAL_CAPSULE | Freq: Once | ORAL | Status: AC
  Administered 2023-12-31: 200 mg via ORAL

## 2023-12-31 MED ORDER — ACETAMINOPHEN 500 MG PO TABS
1000.0000 mg | ORAL_TABLET | Freq: Once | ORAL | Status: AC
  Administered 2023-12-31: 1000 mg via ORAL

## 2023-12-31 MED ORDER — LIDOCAINE 2% (20 MG/ML) 5 ML SYRINGE
INTRAMUSCULAR | Status: AC
  Filled 2023-12-31: qty 5

## 2023-12-31 MED ORDER — ROCURONIUM BROMIDE 10 MG/ML (PF) SYRINGE
PREFILLED_SYRINGE | INTRAVENOUS | Status: AC
  Filled 2023-12-31: qty 10

## 2023-12-31 MED ORDER — AMISULPRIDE (ANTIEMETIC) 5 MG/2ML IV SOLN: 10.0000 mg | Freq: Once | INTRAVENOUS | Status: DC | PRN

## 2023-12-31 MED ORDER — CEFAZOLIN SODIUM-DEXTROSE 2-4 GM/100ML-% IV SOLN
2.0000 g | INTRAVENOUS | Status: AC
Start: 2023-12-31 — End: 2023-12-31
  Administered 2023-12-31: 2 g via INTRAVENOUS

## 2023-12-31 MED ORDER — SODIUM BICARBONATE 4.2 % IV SOLN
INTRAVENOUS | Status: DC | PRN
  Administered 2023-12-31: 1000 mL

## 2023-12-31 MED ORDER — OXYCODONE HCL 5 MG PO TABS: 5.0000 mg | ORAL_TABLET | Freq: Once | ORAL | Status: DC | PRN

## 2023-12-31 MED ORDER — DEXAMETHASONE SODIUM PHOSPHATE 4 MG/ML IJ SOLN
INTRAMUSCULAR | Status: DC | PRN
  Administered 2023-12-31: 5 mg via INTRAVENOUS

## 2023-12-31 MED ORDER — DEXMEDETOMIDINE HCL IN NACL 80 MCG/20ML IV SOLN
INTRAVENOUS | Status: AC
  Filled 2023-12-31: qty 20

## 2023-12-31 MED ORDER — MIDAZOLAM HCL 5 MG/5ML IJ SOLN
INTRAMUSCULAR | Status: DC | PRN
  Administered 2023-12-31: 2 mg via INTRAVENOUS

## 2023-12-31 MED ORDER — ONDANSETRON HCL 4 MG/2ML IJ SOLN
INTRAMUSCULAR | Status: DC | PRN
  Administered 2023-12-31: 4 mg via INTRAVENOUS

## 2023-12-31 MED ORDER — ACETAMINOPHEN 500 MG PO TABS: 1000.0000 mg | ORAL_TABLET | ORAL | Status: DC

## 2023-12-31 MED ORDER — ONDANSETRON HCL 4 MG/2ML IJ SOLN
INTRAMUSCULAR | Status: AC
  Filled 2023-12-31: qty 2

## 2023-12-31 MED ORDER — SUCCINYLCHOLINE CHLORIDE 200 MG/10ML IV SOSY
PREFILLED_SYRINGE | INTRAVENOUS | Status: AC
  Filled 2023-12-31: qty 10

## 2023-12-31 MED ORDER — FENTANYL CITRATE (PF) 100 MCG/2ML IJ SOLN
INTRAMUSCULAR | Status: DC | PRN
  Administered 2023-12-31: 50 ug via INTRAVENOUS
  Administered 2023-12-31 (×2): 25 ug via INTRAVENOUS

## 2023-12-31 MED ORDER — FENTANYL CITRATE (PF) 100 MCG/2ML IJ SOLN
25.0000 ug | INTRAMUSCULAR | Status: DC | PRN
  Administered 2023-12-31: 25 ug via INTRAVENOUS

## 2023-12-31 SURGICAL SUPPLY — 52 items
BINDER ABDOMINAL 10 UNV 27-48 (MISCELLANEOUS) IMPLANT
BINDER ABDOMINAL 12 SM 30-45 (SOFTGOODS) IMPLANT
BINDER ABDOMINAL 9 SM 30-45 (SOFTGOODS) IMPLANT
BINDER BREAST LRG (GAUZE/BANDAGES/DRESSINGS) IMPLANT
BINDER BREAST MEDIUM (GAUZE/BANDAGES/DRESSINGS) IMPLANT
BINDER BREAST XLRG (GAUZE/BANDAGES/DRESSINGS) IMPLANT
BINDER BREAST XXLRG (GAUZE/BANDAGES/DRESSINGS) IMPLANT
BLADE SURG 10 STRL SS (BLADE) IMPLANT
BLADE SURG 11 STRL SS (BLADE) ×1 IMPLANT
BNDG GAUZE DERMACEA FLUFF 4 (GAUZE/BANDAGES/DRESSINGS) ×2 IMPLANT
CANISTER LIPO FAT HARVEST (MISCELLANEOUS) ×1 IMPLANT
CANISTER SUCT 1200ML W/VALVE (MISCELLANEOUS) IMPLANT
CHLORAPREP W/TINT 26 (MISCELLANEOUS) ×1 IMPLANT
COVER BACK TABLE 60X90IN (DRAPES) ×1 IMPLANT
COVER MAYO STAND STRL (DRAPES) ×2 IMPLANT
DERMABOND ADVANCED .7 DNX12 (GAUZE/BANDAGES/DRESSINGS) ×2 IMPLANT
DRAPE TOP ARMCOVERS (MISCELLANEOUS) ×1 IMPLANT
DRAPE U-SHAPE 76X120 STRL (DRAPES) ×1 IMPLANT
DRAPE UTILITY XL STRL (DRAPES) ×1 IMPLANT
DURAPREP 26ML APPLICATOR (WOUND CARE) IMPLANT
ELECT COATED BLADE 2.86 ST (ELECTRODE) IMPLANT
ELECTRODE REM PT RTRN 9FT ADLT (ELECTROSURGICAL) ×1 IMPLANT
GAUZE PAD ABD 8X10 STRL (GAUZE/BANDAGES/DRESSINGS) ×2 IMPLANT
GLOVE BIO SURGEON STRL SZ 6 (GLOVE) ×1 IMPLANT
GOWN STRL REUS W/ TWL LRG LVL3 (GOWN DISPOSABLE) ×2 IMPLANT
LINER CANISTER 1000CC FLEX (MISCELLANEOUS) ×1 IMPLANT
NDL SAFETY ECLIPSE 18X1.5 (NEEDLE) ×1 IMPLANT
NS IRRIG 1000ML POUR BTL (IV SOLUTION) IMPLANT
PACK BASIN DAY SURGERY FS (CUSTOM PROCEDURE TRAY) ×1 IMPLANT
PAD ALCOHOL SWAB (MISCELLANEOUS) ×1 IMPLANT
PENCIL SMOKE EVACUATOR (MISCELLANEOUS) IMPLANT
SHEET MEDIUM DRAPE 40X70 STRL (DRAPES) ×2 IMPLANT
SLEEVE SCD COMPRESS KNEE MED (STOCKING) ×1 IMPLANT
SPIKE FLUID TRANSFER (MISCELLANEOUS) IMPLANT
SPONGE T-LAP 18X18 ~~LOC~~+RFID (SPONGE) ×1 IMPLANT
STAPLER SKIN PROX WIDE 3.9 (STAPLE) ×1 IMPLANT
SUT MNCRL AB 4-0 PS2 18 (SUTURE) IMPLANT
SUT VIC AB 3-0 PS1 18XBRD (SUTURE) IMPLANT
SUT VIC AB 3-0 SH 27X BRD (SUTURE) IMPLANT
SUT VIC AB 4-0 PS2 18 (SUTURE) IMPLANT
SYR 10ML LL (SYRINGE) ×4 IMPLANT
SYR 50ML LL SCALE MARK (SYRINGE) ×1 IMPLANT
SYR BULB IRRIG 60ML STRL (SYRINGE) IMPLANT
SYR CONTROL 10ML LL (SYRINGE) IMPLANT
SYR TB 1ML LL NO SAFETY (SYRINGE) ×1 IMPLANT
SYRINGE TOOMEY IRRIG 70ML (MISCELLANEOUS) IMPLANT
TOWEL GREEN STERILE FF (TOWEL DISPOSABLE) ×2 IMPLANT
TUBE CONNECTING 20X1/4 (TUBING) ×1 IMPLANT
TUBING INFILTRATION IT-10001 (TUBING) ×1 IMPLANT
TUBING SET GRADUATE ASPIR 12FT (MISCELLANEOUS) ×1 IMPLANT
UNDERPAD 30X36 HEAVY ABSORB (UNDERPADS AND DIAPERS) ×2 IMPLANT
YANKAUER SUCT BULB TIP NO VENT (SUCTIONS) IMPLANT

## 2023-12-31 NOTE — Op Note (Signed)
 Operative Note   DATE OF OPERATION: 10.3.2025  LOCATION: [facility]-outpatient  SURGICAL DIVISION: Plastic Surgery  PREOPERATIVE DIAGNOSES:  1. History DCIS 2. CHEK2 mutation  POSTOPERATIVE DIAGNOSES:  same  PROCEDURE:  1. Lipofilling from bilateral flanks to bilateral chest total 85 ml 2. Excision benign lesion trunk 1 cm 3. Layered closure trunk 1.2 cm  SURGEON: Earlis Ranks MD MBA  ASSISTANT: none  ANESTHESIA:  General.   EBL: 10 ml  COMPLICATIONS: None immediate.   INDICATIONS FOR PROCEDURE:  The patient, Kristina King, is a 48 y.o. female born on 1975/04/29, is here for lipofilling to chest for breast reconstruction. Patient also desires excision pigmented mole abdomen.    FINDINGS: 75 ml fat infiltrated over left chest, 10 ml fat infiltrated over right chest.  DESCRIPTION OF PROCEDURE:  The patient's operative site was marked with the patient in the preoperative area including flank donor site and areas desired contouring bilateral chest. The patient was taken to the operating room. SCDs were placed and IV antibiotics were given. The patient's operative site was prepped and draped in a sterile fashion. A time out was performed and all information was confirmed to be correct. Stab incision made over bilateral abdomen. Tumescent infiltrated over bilateral flanks total 400 ml. Power assisted liposuction completed to endpoint symmetric soft tissue thickness. Total lipoaspirate 200 ml. Fat prepared by washing and gravity. Stab incision made over bilateral chest and base of right nipple. Fat infiltrated in subcutaneous plane over superior poles of reconstruction and beneath right nipple areola complex.Stab incisions chest and abdomen approximated with simple 4-0 monocryl stitch.  Sharp excision left abdomen skin lesion completed diameter 1 cm. Layered closure completed with 4-0 monocryl in dermis and 4-0 monocryl subcuticular skin closure, length 1.2 cm. Dermabond  applied.  Dry dressing applied to abdomen followed by abdominal binder.  The patient was allowed to wake from anesthesia, extubated and taken to the recovery room in satisfactory condition.   SPECIMENS: left flank mole  DRAINS: none  Earlis Ranks, MD [facility] Plastic & Reconstructive Surgery  Office/ physician access line after hours [phone]

## 2023-12-31 NOTE — Discharge Instructions (Signed)

## 2023-12-31 NOTE — Anesthesia Procedure Notes (Signed)
 Procedure Name: LMA Insertion Date/Time: 12/31/2023 12:35 PM  Performed by: Emilio Rock BIRCH, CRNAPre-anesthesia Checklist: Patient identified, Emergency Drugs available, Suction available and Patient being monitored Patient Re-evaluated:Patient Re-evaluated prior to induction Oxygen Delivery Method: Circle System Utilized Preoxygenation: Pre-oxygenation with 100% oxygen Induction Type: IV induction Ventilation: Mask ventilation without difficulty LMA: LMA inserted LMA Size: 3.0 Number of attempts: 1 Airway Equipment and Method: bite block Placement Confirmation: positive ETCO2 Tube secured with: Tape Dental Injury: Teeth and Oropharynx as per pre-operative assessment

## 2023-12-31 NOTE — Anesthesia Postprocedure Evaluation (Signed)
 Anesthesia Post Note  Patient: Kristina King  Procedure(s) Performed: LIPOSUCTION BILATERAL FLANKS WITH FAT TRANSFER TO BILATERAL BREASTS (Bilateral: Chest)     Patient location during evaluation: PACU Anesthesia Type: General Level of consciousness: awake and alert Pain management: pain level controlled Vital Signs Assessment: post-procedure vital signs reviewed and stable Respiratory status: spontaneous breathing, nonlabored ventilation and respiratory function stable Cardiovascular status: stable and blood pressure returned to baseline Anesthetic complications: no   No notable events documented.  Last Vitals:  Vitals:   12/31/23 1409 12/31/23 1415  BP:  110/69  Pulse: 76 75  Resp: 13 11  Temp:    SpO2: 98% 96%    Last Pain:  Vitals:   12/31/23 1415  TempSrc:   PainSc: 3                  Debby FORBES Like

## 2023-12-31 NOTE — H&P (Signed)
 Subjective  Patient ID: Kristina King is a 48 y.o. female.  HPI  6 months post op implant exchange. Patient noted left implant able to see borders clearly but right more sloping upper pole. Presents for lipofilling to address this.  Patient's mother and MGM with breast ca. Patient obtained genetic testing earlier this year demonstrating CHEK2 mutation. MMG 09/2022 BIRAD1. Patient had been in process of starting tamoxifen for risk reduction when screening MRI 02/2023 demonstrated 1.1 cm right UOQ mass and right UOQ NME 4 x 1.5 x 1.5 cm. US  showed masslike area in the right breast at 10 o'clock measuring 5 cm, felt to correspond to the NME on MRI. This extends to implant capsule. No sonographic correlate for the mass identified on MRI. US  guided biopsy labeled right breast 10 o clock showed intermediate grade DCIS ER/PR+. MR guided biopsy labeled right breast outer showed microscopic focus IDC with DCIS.   Patient elected for bilateral mastectomies. Final pathology 1.2 cm and 1.5 cm foci of high grade DCIS margins clear, no invasive carcinoma, 0/1 SLN. Course complicated by PE diagnosed on POD#3. Completed 3 mo Eliquis .  Patient underwent augmentation mammaplasty with Dr. Verneda in 2009 submuscular saline. Prior implants Mentor smooth round moderate profile 325 ml implants, fill volume 350 ml REF [phone] bilateral.  Prior to augmentation little B. Prior to mastectomies C cup, happy with this volume. 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 Lives with spouse and has two kids. Older graduated [employer], younger at [employer].  Review of Systems  Objective  Physical Exam  Cardiovascular: Normal rate. Normal heart sound  Pulmonary/Chest Effort normal. 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 Agree with patient soft tissue pinch on right is greater than left contributing to this. Reviewed variable take of graft and fat necrosis that presents as masses similar to what is concerning her over right chest. Reviewed donor site pain need for compression. Plan flanks as donor site. I do not anticipate an abundance of donor site fat for use with just this donor site and would plan to address left chest mainly as this is area of concern. Patient understands this. Reviewed OP surgery no drains.  Earlis Ranks, MD [facility] Plastic & Reconstructive Surgery  Office/ physician access line after hours [phone]    Natrelle Smooth Round Extra Projection Soft Touch 470 ml implants placed bilateral REF [id number]

## 2023-12-31 NOTE — Anesthesia Preprocedure Evaluation (Addendum)
 Anesthesia Evaluation  Patient identified by MRN, date of birth, ID band Patient awake    Reviewed: Allergy & Precautions, NPO status , Patient's Chart, lab work & pertinent test results  History of Anesthesia Complications Negative for: history of anesthetic complications  Airway Mallampati: II  TM Distance: >3 FB Neck ROM: Full    Dental  (+) Dental Advisory Given, Chipped   Pulmonary former smoker, PE   Pulmonary exam normal        Cardiovascular negative cardio ROS Normal cardiovascular exam     Neuro/Psych  Headaches PSYCHIATRIC DISORDERS Anxiety Depression       GI/Hepatic Neg liver ROS,GERD  Controlled,,  Endo/Other   Nontoxic goiter   Renal/GU negative Renal ROS     Musculoskeletal negative musculoskeletal ROS (+)    Abdominal   Peds  Hematology negative hematology ROS (+)   Anesthesia Other Findings   Reproductive/Obstetrics  Breast cancer                               Anesthesia Physical Anesthesia Plan  ASA: 3  Anesthesia Plan: General   Post-op Pain Management: Tylenol  PO (pre-op)* and Celebrex  PO (pre-op)*   Induction: Intravenous  PONV Risk Score and Plan: 3 and Treatment may vary due to age or medical condition, Ondansetron , Dexamethasone  and Midazolam   Airway Management Planned: LMA  Additional Equipment: None  Intra-op Plan:   Post-operative Plan: Extubation in OR  Informed Consent: I have reviewed the patients History and Physical, chart, labs and discussed the procedure including the risks, benefits and alternatives for the proposed anesthesia with the patient or authorized representative who has indicated his/her understanding and acceptance.     Dental advisory given  Plan Discussed with: CRNA and Anesthesiologist  Anesthesia Plan Comments:          Anesthesia Quick Evaluation

## 2023-12-31 NOTE — Transfer of Care (Signed)
 Immediate Anesthesia Transfer of Care Note  Patient: Kristina King  Procedure(s) Performed: LIPOSUCTION BILATERAL FLANKS WITH FAT TRANSFER TO BILATERAL BREASTS (Bilateral: Chest)  Patient Location: PACU  Anesthesia Type:General  Level of Consciousness: awake, alert , oriented, drowsy, and patient cooperative  Airway & Oxygen Therapy: Patient Spontanous Breathing and Patient connected to face mask oxygen  Post-op Assessment: Report given to RN and Post -op Vital signs reviewed and stable  Post vital signs: Reviewed and stable  Last Vitals:  Vitals Value Taken Time  BP 110/71 12/31/23 13:57  Temp    Pulse 84 12/31/23 13:58  Resp 22 12/31/23 13:58  SpO2 100 % 12/31/23 13:58  Vitals shown include unfiled device data.  Last Pain:  Vitals:   12/31/23 1207  TempSrc: Tympanic  PainSc: 0-No pain      Patients Stated Pain Goal: 8 (12/31/23 1207)  Complications: No notable events documented.

## 2024-01-01 ENCOUNTER — Encounter (HOSPITAL_BASED_OUTPATIENT_CLINIC_OR_DEPARTMENT_OTHER): Payer: Self-pay | Admitting: Plastic Surgery

## 2024-01-04 LAB — SURGICAL PATHOLOGY

## 2024-01-25 ENCOUNTER — Ambulatory Visit (INDEPENDENT_AMBULATORY_CARE_PROVIDER_SITE_OTHER): Admitting: Otolaryngology

## 2024-01-25 ENCOUNTER — Encounter (INDEPENDENT_AMBULATORY_CARE_PROVIDER_SITE_OTHER): Payer: Self-pay | Admitting: Otolaryngology

## 2024-01-25 VITALS — BP 119/67 | HR 70 | Temp 98.2°F

## 2024-01-25 DIAGNOSIS — J310 Chronic rhinitis: Secondary | ICD-10-CM | POA: Diagnosis not present

## 2024-01-25 DIAGNOSIS — R0981 Nasal congestion: Secondary | ICD-10-CM | POA: Diagnosis not present

## 2024-01-25 DIAGNOSIS — H6983 Other specified disorders of Eustachian tube, bilateral: Secondary | ICD-10-CM | POA: Diagnosis not present

## 2024-01-25 DIAGNOSIS — J343 Hypertrophy of nasal turbinates: Secondary | ICD-10-CM

## 2024-01-26 NOTE — Progress Notes (Signed)
 Patient ID: Kristina King, female   DOB: [birth year], 48 y.o.   MRN: [id number]  Follow up: Left ear eustachian tube dysfunction, chronic nasal congestion  Discussed the use of AI scribe software for clinical note transcription with the patient, who gave verbal consent to proceed.  History of Present Illness Kristina King is a 48 year old female who presents with persistent left ear eustachian tube dysfunction.  She has experienced persistent clogging in her left ear over the past four weeks, describing the sensation as 'clogged'. The sensation is slightly better than before. She is able to perform the Valsalva maneuver, which temporarily relieves the pressure, but the sensation returns shortly after swallowing.  She has been using Flonase nasal spray as directed, but notes that when she sprays the left side, she does not feel the medication going down her throat, unlike the right side.   She reports a sensation of muffled hearing in the left ear, although she is able to pop the ear, which provides temporary relief. She has not experienced any fluid discharge or significant pain. She is concerned about an upcoming flight to [place]  in December and asks about potential ear discomfort due to pressure changes.   The patient also complains of chronic nasal congestion.  She denies any facial pain, fever, or visual change.  She was previously noted to have bilateral inferior turbinate hypertrophy.   Exam: General: Communicates without difficulty, well nourished, no acute distress. Head: Normocephalic, no evidence injury, no tenderness, facial buttresses intact without stepoff. Face/sinus: No tenderness to palpation and percussion. Facial movement is normal and symmetric. Eyes: PERRL, EOMI. No scleral icterus, conjunctivae clear. Neuro: CN II exam reveals vision grossly intact.  No nystagmus at any point of gaze. Ears: Auricles well formed without lesions.  Ear canals are intact without mass or  lesion.  No erythema or edema is appreciated.  The TMs are intact without fluid. Nose: External evaluation reveals normal support and skin without lesions.  Dorsum is intact.  Anterior rhinoscopy reveals congested mucosa over anterior aspect of inferior turbinates and intact septum.  No purulence noted. Oral:  Oral cavity and oropharynx are intact, symmetric, without erythema or edema.  Mucosa is moist without lesions. Neck: Full range of motion without pain.  There is no significant lymphadenopathy.  No masses palpable.  Thyroid  bed within normal limits to palpation.  Parotid glands and submandibular glands equal bilaterally without mass.  Trachea is midline. Neuro:  CN 2-12 grossly intact.    Procedure:  Flexible Nasal Endoscopy: Description: Risks, benefits, and alternatives of flexible endoscopy were explained to the patient.  Specific mention was made of the risk of throat numbness with difficulty swallowing, possible bleeding from the nose and mouth, and pain from the procedure.  The patient gave oral consent to proceed.  The flexible scope was inserted into the right nasal cavity.  Endoscopy of the interior nasal cavity, superior, inferior, and middle meatus was performed. The sphenoid-ethmoid recess was examined. Edematous mucosa was noted.  No polyp, mass, or lesion was appreciated. Olfactory cleft was clear.  Nasopharynx was clear.  Turbinates were hypertrophied but without mass.  The procedure was repeated on the contralateral side with similar findings.  The patient tolerated the procedure well.    Assessment and Plan Assessment & Plan Left eustachian tube dysfunction Persistent left eustachian tube dysfunction with muffled hearing and sensation of clogging. No fluid or retraction of the eardrum. Ability to perform Valsalva maneuver indicates some functionality.  -  Continue Flonase Sensimist nasal spray, two sprays in each nostril, directed towards the ear. - Scheduled follow-up in two months  to reassess condition.  Chronic rhinitis with bilateral inferior turbinate hypertrophy Chronic rhinitis with more significant congestion on the left side.  - Switched to [employer] nasal spray for improved delivery to the eustachian tube area.

## 2024-02-09 ENCOUNTER — Ambulatory Visit (INDEPENDENT_AMBULATORY_CARE_PROVIDER_SITE_OTHER): Admitting: Otolaryngology

## 2024-02-09 ENCOUNTER — Encounter (INDEPENDENT_AMBULATORY_CARE_PROVIDER_SITE_OTHER): Payer: Self-pay | Admitting: Otolaryngology

## 2024-02-09 VITALS — BP 121/79 | HR 75 | Temp 97.3°F | Ht 64.0 in | Wt 130.0 lb

## 2024-02-09 DIAGNOSIS — J343 Hypertrophy of nasal turbinates: Secondary | ICD-10-CM | POA: Diagnosis not present

## 2024-02-09 DIAGNOSIS — J309 Allergic rhinitis, unspecified: Secondary | ICD-10-CM | POA: Diagnosis not present

## 2024-02-09 DIAGNOSIS — H6982 Other specified disorders of Eustachian tube, left ear: Secondary | ICD-10-CM | POA: Diagnosis not present

## 2024-02-09 DIAGNOSIS — H6983 Other specified disorders of Eustachian tube, bilateral: Secondary | ICD-10-CM

## 2024-02-09 DIAGNOSIS — J301 Allergic rhinitis due to pollen: Secondary | ICD-10-CM

## 2024-02-09 MED ORDER — AZELASTINE HCL 0.1 % NA SOLN
2.0000 | Freq: Two times a day (BID) | NASAL | 12 refills | Status: AC
Start: 2024-02-09 — End: ?

## 2024-02-09 NOTE — Progress Notes (Signed)
 Patient ID: Kristina King, female   DOB: [birth year], 48 y.o.   MRN: [id number]  Follow-up: Eustachian tube dysfunction, chronic nasal congestion  HPI: The patient is a 48 year old female who returns today complaining of increasing allergy symptoms for the past week.  The patient was last seen 2 weeks ago for her left ear eustachian tube dysfunction, nasal mucosal congestion, and bilateral inferior turbinate hypertrophy.  She was treated with Sensimist nasal spray.  According to the patient, she had a major allergy flareup over the weekend.  It resulted in severe nasal congestion and increasing pressure sensation in her ears.  In addition to Sensimist, she is also using Zyrtec daily.  She denies any fever or visual change.  Exam: General: Communicates without difficulty, well nourished, no acute distress. Head: Normocephalic, no evidence injury, no tenderness, facial buttresses intact without stepoff. Face/sinus: No tenderness to palpation and percussion. Facial movement is normal and symmetric. Eyes: PERRL, EOMI. No scleral icterus, conjunctivae clear. Neuro: CN II exam reveals vision grossly intact.  No nystagmus at any point of gaze. Ears: Auricles well formed without lesions.  Ear canals are intact without mass or lesion.  No erythema or edema is appreciated.  The TMs are intact without fluid. Nose: External evaluation reveals normal support and skin without lesions.  Dorsum is intact.  Anterior rhinoscopy reveals congested mucosa over anterior aspect of inferior turbinates and intact septum.  No purulence noted. Oral:  Oral cavity and oropharynx are intact, symmetric, without erythema or edema.  Mucosa is moist without lesions. Neck: Full range of motion without pain.  There is no significant lymphadenopathy.  No masses palpable.  Thyroid  bed within normal limits to palpation.  Parotid glands and submandibular glands equal bilaterally without mass.  Trachea is midline. Neuro:  CN 2-12 grossly intact.    Assessment: 1.  Chronic/allergic rhinitis, with severe nasal mucosal congestion and bilateral inferior turbinate hypertrophy. 2.  No polyps, mass, or purulent drainage is noted today. 3.  Persistent left ear eustachian tube dysfunction.  Her tympanic membranes and middle ear spaces are normal.  Plan: 1.  The physical exam findings are reviewed with the patient. 2.  The patient is reassured and no acute infection is noted today. 3.  Azelastine nasal spray in addition to Sensimist daily. 4.  Valsalva exercise multiple times a day. 5.  The patient may benefit from an allergy evaluation.

## 2024-03-07 ENCOUNTER — Encounter: Payer: Self-pay | Admitting: Family Medicine

## 2024-03-08 ENCOUNTER — Other Ambulatory Visit: Payer: Self-pay | Admitting: Plastic Surgery

## 2024-03-08 DIAGNOSIS — R222 Localized swelling, mass and lump, trunk: Secondary | ICD-10-CM

## 2024-03-09 ENCOUNTER — Other Ambulatory Visit: Payer: Self-pay

## 2024-03-09 DIAGNOSIS — M5416 Radiculopathy, lumbar region: Secondary | ICD-10-CM

## 2024-03-10 ENCOUNTER — Ambulatory Visit (INDEPENDENT_AMBULATORY_CARE_PROVIDER_SITE_OTHER): Admitting: Otolaryngology

## 2024-03-10 ENCOUNTER — Encounter (INDEPENDENT_AMBULATORY_CARE_PROVIDER_SITE_OTHER): Payer: Self-pay | Admitting: Otolaryngology

## 2024-03-10 VITALS — BP 104/69 | HR 78 | Ht 64.0 in | Wt 129.0 lb

## 2024-03-10 DIAGNOSIS — H6982 Other specified disorders of Eustachian tube, left ear: Secondary | ICD-10-CM

## 2024-03-10 DIAGNOSIS — J343 Hypertrophy of nasal turbinates: Secondary | ICD-10-CM | POA: Diagnosis not present

## 2024-03-10 DIAGNOSIS — J301 Allergic rhinitis due to pollen: Secondary | ICD-10-CM | POA: Diagnosis not present

## 2024-03-10 DIAGNOSIS — H6983 Other specified disorders of Eustachian tube, bilateral: Secondary | ICD-10-CM

## 2024-03-10 DIAGNOSIS — R040 Epistaxis: Secondary | ICD-10-CM | POA: Insufficient documentation

## 2024-03-10 NOTE — Progress Notes (Signed)
 Patient ID: SAHMYA ARAI, female   DOB: 12/06/1975, 48 y.o.   MRN: [id number]  Follow up: Eustachian tube dysfunction, chronic nasal congestion, epistaxis  History of Present Illness Kristina King is a 48 year old female with allergic rhinitis, turbinate hypertrophy, and eustachian tube dysfunction who presents for follow-up of persistent nasal congestion.  She reports fluctuating nasal congestion with periods of improvement and worsening, currently experiencing mild nasal obstruction. She denies active allergy symptoms and frequent nose blowing. She completed a two-week course of azelastine  nasal spray and continues daily use of Flonase Sensimist. She also uses a vitamin-based antihistamine supplement and a propolis nasal spray containing xylitol, which she feels provides some relief. Humidification is used at home to address nasal dryness.  She experiences intermittent ear pressure and difficulty equalizing ear pressure, predominantly on the right side. She is able to pop her left ear but not the right, and expresses concern regarding ear pressure with upcoming air travel. She has previously used Sudafed for congestion.  She has recurrent epistaxis, particularly during winter months, and notes a dry scab in her nose. She has increased use of nasal sprays and humidification to manage dryness, and finds the propolis/xylitol nasal spray to be slightly thicker than standard saline sprays, providing some benefit.  Exam: General: Communicates without difficulty, well nourished, no acute distress. Head: Normocephalic, no evidence injury, no tenderness, facial buttresses intact without stepoff. Face/sinus: No tenderness to palpation and percussion. Facial movement is normal and symmetric. Eyes: PERRL, EOMI. No scleral icterus, conjunctivae clear. Neuro: CN II exam reveals vision grossly intact.  No nystagmus at any point of gaze. Ears: Auricles well formed without lesions.  Ear canals are intact without  mass or lesion.  No erythema or edema is appreciated.  The TMs are intact without fluid. Nose: External evaluation reveals normal support and skin without lesions.  Dorsum is intact.  Anterior rhinoscopy reveals congested mucosa over anterior aspect of inferior turbinates and intact septum.  No purulence noted. Oral:  Oral cavity and oropharynx are intact, symmetric, without erythema or edema.  Mucosa is moist without lesions. Neck: Full range of motion without pain.  There is no significant lymphadenopathy.  No masses palpable.  Thyroid  bed within normal limits to palpation.  Parotid glands and submandibular glands equal bilaterally without mass.  Trachea is midline. Neuro:  CN 2-12 grossly intact.    Assessment and Plan Assessment & Plan Chronic allergic rhinitis with turbinate hypertrophy Intermittent nasal congestion with fluctuating severity, improved compared to prior visits. Nasal mucosa less edematous on examination. No current significant allergy symptoms. Maintains intranasal corticosteroid and oral antihistamine regimen. Azelastine  nasal spray effective for acute allergy exacerbations. - Continue Sensimist nasal spray and daily cetirizine. - Use azelastine  nasal spray for acute allergy flares (e.g., sneezing, pruritus, rhinorrhea). - Continue Valsalva maneuvers to support eustachian tube function. - Follow-up scheduled for April unless symptoms worsen.  Persistent left ear eustachian tube dysfunction Intermittent ability to equalize left ear pressure with occasional aural fullness. Physical examination unremarkable. Air travel planned; discussed risk of barotrauma if unable to equalize middle ear pressure. Emphasized necessity of effective autoinflation during flight. - Use oxymetazoline nasal spray (two sprays per nostril) approximately 30 minutes prior to flight to facilitate eustachian tube patency. - Continue Valsalva maneuvers. - Reviewed risk of otalgia and barotrauma if unable to  equalize pressure during flight.  Epistaxis secondary to nasal mucosal dryness Frequent epistaxis, particularly in winter, with visible dry scab. Likely exacerbated by chronic intranasal corticosteroid use  and low ambient humidity. Humidifier and xylitol-containing nasal spray provide partial relief. Discussed increased risk of mucosal thinning and bleeding with steroid nasal sprays in dry conditions. In-office cauterization offered if epistaxis worsens or becomes frequent. - Continue humidifier use to maintain nasal mucosal hydration. - Switch to or add saline gel nasal spray (e.g., AYR gel spray) for prolonged moisture, up to 2-4 times daily as needed. - If epistaxis worsens or becomes frequent, in-office cauterization of bleeding sites offered.

## 2024-03-14 ENCOUNTER — Other Ambulatory Visit: Payer: Self-pay | Admitting: Family Medicine

## 2024-03-14 DIAGNOSIS — M5416 Radiculopathy, lumbar region: Secondary | ICD-10-CM

## 2024-03-20 ENCOUNTER — Ambulatory Visit
Admission: RE | Admit: 2024-03-20 | Discharge: 2024-03-20 | Disposition: A | Source: Ambulatory Visit | Attending: Plastic Surgery

## 2024-03-20 DIAGNOSIS — R222 Localized swelling, mass and lump, trunk: Secondary | ICD-10-CM

## 2024-03-21 ENCOUNTER — Other Ambulatory Visit: Payer: Self-pay | Admitting: Family Medicine

## 2024-03-21 ENCOUNTER — Encounter: Payer: Self-pay | Admitting: Family Medicine

## 2024-03-21 MED ORDER — UBRELVY 100 MG PO TABS: 100.0000 mg | ORAL_TABLET | Freq: Every day | ORAL | 3 refills | Status: AC | PRN

## 2024-03-21 NOTE — Telephone Encounter (Signed)
 Patient is wanting a refill on the following medication.   Okay to refill under your name?

## 2024-03-21 NOTE — Telephone Encounter (Signed)
 Copied from CRM #[id number]. Topic: Clinical - Medication Refill >> Mar 21, 2024 11:30 AM Suzen RAMAN wrote: Medication: Ubrogepant  (UBRELVY  PO) 100 MG  Has the patient contacted their pharmacy? Yes  This is the patient's preferred pharmacy:  [id number] [address], [place] - [address] AT [facility] [address] [facility] [address] [place] [address] Phone: [phone] Fax: [phone]   Is this the correct pharmacy for this prescription? Yes If no, delete pharmacy and type the correct one.   Has the prescription been filled recently? No  Is the patient out of the medication? No; only has 2 pills left   Has the patient been seen for an appointment in the last year OR does the patient have an upcoming appointment? Yes  Can we respond through MyChart? Yes  Agent: Please be advised that Rx refills may take up to 3 business days. We ask that you follow-up with your pharmacy.

## 2024-03-22 ENCOUNTER — Telehealth: Payer: Self-pay

## 2024-03-22 ENCOUNTER — Other Ambulatory Visit (HOSPITAL_COMMUNITY): Payer: Self-pay

## 2024-03-22 NOTE — Telephone Encounter (Signed)
 Pharmacy Patient Advocate Encounter   Received notification from Onbase that prior authorization for Ubrelvy  100MG  tablets is required/requested.   Insurance verification completed.   The patient is insured through [employer].   Per test claim: PA required; PA submitted to above mentioned insurance via Latent Key/confirmation #/EOC [id number] Status is pending

## 2024-03-24 ENCOUNTER — Other Ambulatory Visit (HOSPITAL_COMMUNITY): Payer: Self-pay

## 2024-03-24 NOTE — Telephone Encounter (Signed)
 Pharmacy Patient Advocate Encounter  Received notification from [employer] that Prior Authorization for  Ubrelvy  100MG  tablets  has been APPROVED from 03/22/24 to 03/22/25. Ran test claim, Copay is $0.00. This test claim was processed through [facility]- copay amounts may vary at other pharmacies due to pharmacy/plan contracts, or as the patient moves through the different stages of their insurance plan.   PA #/Case ID/Reference #: [id number]

## 2024-03-27 ENCOUNTER — Other Ambulatory Visit

## 2024-03-27 NOTE — Discharge Instructions (Signed)

## 2024-03-28 ENCOUNTER — Inpatient Hospital Stay
Admission: RE | Admit: 2024-03-28 | Discharge: 2024-03-28 | Disposition: A | Discharge status: Home / Self Care | Source: Ambulatory Visit | Attending: Family Medicine | Admitting: Family Medicine

## 2024-07-12 ENCOUNTER — Ambulatory Visit (INDEPENDENT_AMBULATORY_CARE_PROVIDER_SITE_OTHER): Admitting: Otolaryngology

## 2024-10-30 ENCOUNTER — Ambulatory Visit: Admitting: Hematology and Oncology
